# Patient Record
Sex: Male | Born: 1955 | Race: Black or African American | Hispanic: No | Smoking: Current some day smoker
Health system: Southern US, Community
[De-identification: ages and names within clinical notes are randomized; demographics above are authoritative.]

## PROBLEM LIST (undated history)

## (undated) DIAGNOSIS — C220 Liver cell carcinoma: Secondary | ICD-10-CM

## (undated) DIAGNOSIS — E119 Type 2 diabetes mellitus without complications: Secondary | ICD-10-CM

## (undated) DIAGNOSIS — B1920 Unspecified viral hepatitis C without hepatic coma: Secondary | ICD-10-CM

## (undated) DIAGNOSIS — B019 Varicella without complication: Secondary | ICD-10-CM

## (undated) DIAGNOSIS — I10 Essential (primary) hypertension: Secondary | ICD-10-CM

## (undated) HISTORY — DX: Unspecified viral hepatitis C without hepatic coma: B19.20

## (undated) HISTORY — PX: WISDOM TOOTH EXTRACTION: SHX21

## (undated) HISTORY — PX: FINGER SURGERY: SHX640

## (undated) HISTORY — DX: Varicella without complication: B01.9

---

## 2012-07-05 ENCOUNTER — Emergency Department (INDEPENDENT_AMBULATORY_CARE_PROVIDER_SITE_OTHER)
Admission: EM | Admit: 2012-07-05 | Discharge: 2012-07-05 | Disposition: A | Discharge status: Home / Self Care | Payer: 59 | Source: Home / Self Care

## 2012-07-05 ENCOUNTER — Encounter (HOSPITAL_COMMUNITY): Payer: Self-pay | Admitting: Emergency Medicine

## 2012-07-05 DIAGNOSIS — R05 Cough: Secondary | ICD-10-CM

## 2012-07-05 DIAGNOSIS — E119 Type 2 diabetes mellitus without complications: Secondary | ICD-10-CM

## 2012-07-05 DIAGNOSIS — I10 Essential (primary) hypertension: Secondary | ICD-10-CM

## 2012-07-05 DIAGNOSIS — L723 Sebaceous cyst: Secondary | ICD-10-CM

## 2012-07-05 HISTORY — DX: Essential (primary) hypertension: I10

## 2012-07-05 HISTORY — DX: Type 2 diabetes mellitus without complications: E11.9

## 2012-07-05 MED ORDER — METFORMIN HCL 1000 MG PO TABS: 1000.0000 mg | ORAL_TABLET | Freq: Two times a day (BID) | ORAL | Status: DC

## 2012-07-05 MED ORDER — LISINOPRIL 10 MG PO TABS: 10.0000 mg | ORAL_TABLET | Freq: Every day | ORAL | Status: DC

## 2012-07-05 MED ORDER — GLIMEPIRIDE 4 MG PO TABS: 4.0000 mg | ORAL_TABLET | Freq: Every day | ORAL | Status: DC

## 2012-07-05 MED ORDER — SITAGLIPTIN PHOSPHATE 100 MG PO TABS: 100.0000 mg | ORAL_TABLET | Freq: Every day | ORAL | Status: DC

## 2012-07-05 MED ORDER — FEXOFENADINE HCL 180 MG PO TABS: 180.0000 mg | ORAL_TABLET | Freq: Every day | ORAL | Status: DC

## 2012-07-05 NOTE — ED Provider Notes (Signed)
Medical screening examination/treatment/procedure(s) were performed by non-physician practitioner and as supervising physician I was immediately available for consultation/collaboration.   MORENO-COLL,Orian Amberg; MD  Yuepheng Schaller Moreno-Coll, MD 07/05/12 1547 

## 2012-07-05 NOTE — ED Notes (Signed)
Pt is here needing refills on medications... No PCP Also c/o cough onset 2 weeks and abscess on back He is alert and oriented w/no signs of acute distress.

## 2012-07-05 NOTE — ED Provider Notes (Signed)
History     CSN: [id number]  Arrival date & time 07/05/12  1151   None     Chief Complaint  Patient presents with  . Medication Refill  . Cough  . Abscess    (Consider location/radiation/quality/duration/timing/severity/associated sxs/prior treatment) HPI Comments: Pt presents requesting medication refills bc he is currently between PCPs and is about to run out of his diabetic medications.  He has only completely run out of the glimepiride but he has a few left of his metformin, januvia, and lisinopril.  He is also c/o a mild dry cough and a swollen lump on his back.  The cough has been present for a few weeks.  He thinks this is probably from allergies.  He has not tried any OTC cough medications.  Denies any SOB, fever, chills, pleuritic pain.  The lump on his back has been there for at least 2 years and has not changed at all in that time.  Denies any pain, drainage, redness associated with this.  He has never had it addressed before.  He has been on all his antidiabetic medications and lisinopril for years.    Patient is a 57 y.o. male presenting with cough and abscess.  Cough Associated symptoms: no chest pain, no chills, no fever, no myalgias, no rash, no shortness of breath and no sore throat   Abscess Associated symptoms: no fatigue, no fever, no nausea and no vomiting     Past Medical History  Diagnosis Date  . Hypertension   . Diabetes mellitus without complication     History reviewed. No pertinent past surgical history.  No family history on file.  History  Substance Use Topics  . Smoking status: Current Every Day Smoker -- 0.50 packs/day    Types: Cigarettes  . Smokeless tobacco: Not on file  . Alcohol Use: Yes      Review of Systems  Constitutional: Negative for fever, chills and fatigue.  HENT: Negative for sore throat, neck pain and neck stiffness.   Eyes: Negative for visual disturbance.  Respiratory: Positive for cough. Negative for shortness of  breath.   Cardiovascular: Negative for chest pain, palpitations and leg swelling.  Gastrointestinal: Negative for nausea, vomiting, abdominal pain, diarrhea and constipation.  Endocrine: Negative for cold intolerance, heat intolerance, polydipsia and polyuria.  Genitourinary: Negative for dysuria, urgency, frequency and hematuria.  Musculoskeletal: Negative for myalgias and arthralgias.  Skin: Negative for rash.       See HPI regarding back lesion   Neurological: Negative for dizziness, weakness and light-headedness.    Allergies  Review of patient's allergies indicates no known allergies.  Home Medications   Current Outpatient Rx  Name  Route  Sig  Dispense  Refill  . fexofenadine (ALLEGRA) 180 MG tablet   Oral   Take 1 tablet (180 mg total) by mouth daily.   30 tablet   1   . glimepiride (AMARYL) 4 MG tablet   Oral   Take 1 tablet (4 mg total) by mouth daily before breakfast.   60 tablet   0   . lisinopril (PRINIVIL,ZESTRIL) 10 MG tablet   Oral   Take 1 tablet (10 mg total) by mouth daily.   30 tablet   0   . metFORMIN (GLUCOPHAGE) 1000 MG tablet   Oral   Take 1 tablet (1,000 mg total) by mouth 2 (two) times daily with a meal.   60 tablet   0   . sitaGLIPtin (JANUVIA) 100 MG tablet   Oral  Take 1 tablet (100 mg total) by mouth daily.   30 tablet   0     BP 124/80  Pulse 91  Temp(Src) 97.6 F (36.4 C) (Oral)  Resp 18  SpO2 98%  Physical Exam  Constitutional: He is oriented to person, place, and time. He appears well-developed and well-nourished. No distress.  HENT:  Head: Normocephalic and atraumatic.  Eyes: EOM are normal. Pupils are equal, round, and reactive to light.  Cardiovascular: Normal rate and regular rhythm.  Exam reveals no gallop and no friction rub.   No murmur heard. Pulmonary/Chest: Effort normal and breath sounds normal. No respiratory distress. He has no wheezes. He has no rales.  Abdominal: Soft. There is no tenderness.    Neurological: He is oriented to person, place, and time.  Skin: Skin is warm and dry. No rash noted.     Psychiatric: He has a normal mood and affect. Judgment normal.    ED Course  Procedures (including critical care time)  Labs Reviewed - No data to display No results found.   1. Diabetes mellitus, type 2   2. Hypertension   3. Sebaceous cyst   4. Cough       MDM  Will refill his medications for a month, pt also given info for establishing PCP in the area.  Offered referral for general surgery or derm to remove sebaceous cyst, pt declines at this time.  Will use allegra for allergies/cough, pt will return in a week if this does not resolve the issue or if he gets worse.     Meds ordered this encounter  Medications  . DISCONTD: glimepiride (AMARYL) 4 MG tablet    Sig: Take 4 mg by mouth daily before breakfast.  . DISCONTD: metFORMIN (GLUCOPHAGE) 1000 MG tablet    Sig: Take 1,000 mg by mouth 2 (two) times daily with a meal.  . DISCONTD: lisinopril (PRINIVIL,ZESTRIL) 10 MG tablet    Sig: Take 10 mg by mouth daily.  Marland Kitchen DISCONTD: sitaGLIPtin (JANUVIA) 100 MG tablet    Sig: Take 100 mg by mouth daily.  Marland Kitchen glimepiride (AMARYL) 4 MG tablet    Sig: Take 1 tablet (4 mg total) by mouth daily before breakfast.    Dispense:  60 tablet    Refill:  0  . lisinopril (PRINIVIL,ZESTRIL) 10 MG tablet    Sig: Take 1 tablet (10 mg total) by mouth daily.    Dispense:  30 tablet    Refill:  0  . metFORMIN (GLUCOPHAGE) 1000 MG tablet    Sig: Take 1 tablet (1,000 mg total) by mouth 2 (two) times daily with a meal.    Dispense:  60 tablet    Refill:  0  . sitaGLIPtin (JANUVIA) 100 MG tablet    Sig: Take 1 tablet (100 mg total) by mouth daily.    Dispense:  30 tablet    Refill:  0  . fexofenadine (ALLEGRA) 180 MG tablet    Sig: Take 1 tablet (180 mg total) by mouth daily.    Dispense:  30 tablet    Refill:  1           Graylon Good, PA-C 07/05/12 1429

## 2013-02-27 LAB — HM DIABETES EYE EXAM

## 2013-06-08 ENCOUNTER — Ambulatory Visit: Payer: 59 | Admitting: Physician Assistant

## 2013-07-18 ENCOUNTER — Encounter: Payer: Self-pay | Admitting: Physician Assistant

## 2013-07-18 ENCOUNTER — Ambulatory Visit (INDEPENDENT_AMBULATORY_CARE_PROVIDER_SITE_OTHER): Payer: 59 | Admitting: Physician Assistant

## 2013-07-18 VITALS — BP 140/86 | HR 92 | Temp 98.4°F | Resp 16 | Ht 69.25 in | Wt 261.5 lb

## 2013-07-18 DIAGNOSIS — J3089 Other allergic rhinitis: Secondary | ICD-10-CM

## 2013-07-18 DIAGNOSIS — I10 Essential (primary) hypertension: Secondary | ICD-10-CM

## 2013-07-18 DIAGNOSIS — Z23 Encounter for immunization: Secondary | ICD-10-CM

## 2013-07-18 DIAGNOSIS — Z0000 Encounter for general adult medical examination without abnormal findings: Secondary | ICD-10-CM

## 2013-07-18 DIAGNOSIS — N529 Male erectile dysfunction, unspecified: Secondary | ICD-10-CM

## 2013-07-18 DIAGNOSIS — E119 Type 2 diabetes mellitus without complications: Secondary | ICD-10-CM

## 2013-07-18 LAB — CBC
HCT: 43.2 % (ref 39.0–52.0)
Hemoglobin: 14.6 g/dL (ref 13.0–17.0)
MCH: 28.6 pg (ref 26.0–34.0)
MCHC: 33.8 g/dL (ref 30.0–36.0)
MCV: 84.5 fL (ref 78.0–100.0)
PLATELETS: 194 10*3/uL (ref 150–400)
RBC: 5.11 MIL/uL (ref 4.22–5.81)
RDW: 14.3 % (ref 11.5–15.5)
WBC: 5.2 10*3/uL (ref 4.0–10.5)

## 2013-07-18 LAB — LIPID PANEL
CHOLESTEROL: 160 mg/dL (ref 0–200)
HDL: 29 mg/dL — AB (ref >39)
LDL Cholesterol: 102 mg/dL — ABNORMAL HIGH (ref 0–99)
TRIGLYCERIDES: 147 mg/dL (ref <150)
Total CHOL/HDL Ratio: 5.5 Ratio
VLDL: 29 mg/dL (ref 0–40)

## 2013-07-18 LAB — COMPREHENSIVE METABOLIC PANEL
ALK PHOS: 70 U/L (ref 39–117)
ALT: 121 U/L — AB (ref 0–53)
AST: 64 U/L — AB (ref 0–37)
Albumin: 4.4 g/dL (ref 3.5–5.2)
BILIRUBIN TOTAL: 0.5 mg/dL (ref 0.2–1.2)
BUN: 8 mg/dL (ref 6–23)
CO2: 29 mEq/L (ref 19–32)
Calcium: 9.9 mg/dL (ref 8.4–10.5)
Chloride: 101 mEq/L (ref 96–112)
Creat: 0.85 mg/dL (ref 0.50–1.35)
GLUCOSE: 223 mg/dL — AB (ref 70–99)
POTASSIUM: 4.1 meq/L (ref 3.5–5.3)
SODIUM: 139 meq/L (ref 135–145)
TOTAL PROTEIN: 7 g/dL (ref 6.0–8.3)

## 2013-07-18 LAB — HEMOGLOBIN A1C
HEMOGLOBIN A1C: 10 % — AB (ref <5.7)
Mean Plasma Glucose: 240 mg/dL — ABNORMAL HIGH (ref <117)

## 2013-07-18 LAB — TSH: TSH: 0.765 u[IU]/mL (ref 0.350–4.500)

## 2013-07-18 MED ORDER — GLIMEPIRIDE 4 MG PO TABS: 4.0000 mg | ORAL_TABLET | Freq: Every day | ORAL | Status: DC

## 2013-07-18 MED ORDER — SITAGLIPTIN PHOSPHATE 100 MG PO TABS: 100.0000 mg | ORAL_TABLET | Freq: Every day | ORAL | Status: DC

## 2013-07-18 MED ORDER — SILDENAFIL CITRATE 100 MG PO TABS: 100.0000 mg | ORAL_TABLET | ORAL | Status: DC | PRN

## 2013-07-18 MED ORDER — FEXOFENADINE HCL 180 MG PO TABS: 180.0000 mg | ORAL_TABLET | Freq: Every day | ORAL | Status: DC | PRN

## 2013-07-18 MED ORDER — LISINOPRIL 20 MG PO TABS: 20.0000 mg | ORAL_TABLET | Freq: Every day | ORAL | Status: DC

## 2013-07-18 MED ORDER — METFORMIN HCL 1000 MG PO TABS: 1000.0000 mg | ORAL_TABLET | Freq: Two times a day (BID) | ORAL | Status: DC

## 2013-07-18 NOTE — Progress Notes (Signed)
Patient presents to clinic today to establish care.  Acute Concerns: No acute concerns at today's visit.  Chronic Issues: Hypertension -- Currently on Lisinopril 20 mg daily.  Endorses good BP at home.  Denies headaches, vision changes, lightheadedness, chest pain or palpitations.  Diabetes Mellitus II -- Currently on Amaryl, Metformin and Januvia.  Has been out of medication for several weeks.  Allergic Rhinitis -- Controlled with Allegra.  Health Maintenance: Dental -- up-to-date Vision -- up-to-date Immunizations -- Tetanus overdue. Pneumonia recommended due to smoking history Colonoscopy -- Overdue.  Needs referral to GI.  Past Medical History  Diagnosis Date  . Hypertension   . Diabetes mellitus without complication   . Chicken pox     Past Surgical History  Procedure Laterality Date  . Finger surgery      Right-Tendon Cut  . Wisdom tooth extraction      No current outpatient prescriptions on file prior to visit.   No current facility-administered medications on file prior to visit.    No Known Allergies  Family History  Problem Relation Age of Onset  . Heart attack Father     Deceased  . Heart disease Father   . Diabetes Mother     Living  . Heart attack Brother 92    Deceased  . Sudden death Brother   . Thyroid disease Sister   . Diabetes Sister   . Healthy Sister     x1  . Colon cancer Brother     x1  . Heart disease Maternal Grandfather   . Allergies Daughter   . Diabetes Son   . Diabetes Maternal Grandmother     History   Social History  . Marital Status: Divorced    Spouse Name: N/A    Number of Children: N/A  . Years of Education: N/A   Occupational History  . Not on file.   Social History Main Topics  . Smoking status: Current Some Day Smoker -- 0.25 packs/day    Types: Cigarettes  . Smokeless tobacco: Never Used  . Alcohol Use: Yes  . Drug Use: No  . Sexual Activity: Not on file   Other Topics Concern  . Not on file    Social History Narrative  . No narrative on file   Review of Systems  Constitutional: Negative for fever and weight loss.  HENT: Negative for ear discharge, ear pain, hearing loss, nosebleeds and tinnitus.   Eyes: Negative for blurred vision, double vision, photophobia, pain and discharge.  Respiratory: Negative for cough and shortness of breath.   Cardiovascular: Negative for chest pain and palpitations.  Gastrointestinal: Negative for heartburn, nausea, vomiting, abdominal pain, diarrhea, constipation, blood in stool and melena.  Genitourinary: Negative for dysuria, urgency, frequency, hematuria and flank pain.       + Urinary hesitancy, Nocturia x 3-4.  Neurological: Negative for dizziness, loss of consciousness and headaches.  Psychiatric/Behavioral: Negative for depression, suicidal ideas, hallucinations and substance abuse. The patient is not nervous/anxious and does not have insomnia.    BP 140/86  Pulse 92  Temp(Src) 98.4 F (36.9 C) (Oral)  Resp 16  Ht 5' 9.25" (1.759 m)  Wt 261 lb 8 oz (118.616 kg)  BMI 38.34 kg/m2  SpO2 97%  Physical Exam  Vitals reviewed. Constitutional: He is well-developed, well-nourished, and in no distress.  HENT:  Head: Normocephalic and atraumatic.  Right Ear: External ear normal.  Left Ear: External ear normal.  Nose: Nose normal.  Mouth/Throat: Oropharynx is clear and moist.  Cardiovascular: Normal rate, regular rhythm, normal heart sounds and intact distal pulses.   Pulmonary/Chest: Effort normal and breath sounds normal. No respiratory distress. He has no wheezes. He has no rales. He exhibits no tenderness.  Abdominal: Soft. Bowel sounds are normal. He exhibits no distension and no mass. There is no tenderness. There is no rebound and no guarding.  Skin: Skin is warm and dry. No rash noted.  Psychiatric: Affect normal.   Assessment/Plan: Essential hypertension Continue current regimen.  DASH diet encouraged.  Type II or  unspecified type diabetes mellitus without mention of complication, not stated as uncontrolled Continue current regimen.  Will obtain labs to include BMP and A1C.  Continue ACEI.  Diabetic foot exam performed without abnormal findings.  Erectile dysfunction VOucher and RX given for Viagra.  Instructions reviewed with patient.  Visit for preventive health examination History reviewed.  Will obtain fasting labs.  Tetanus and Pneumonia vaccination given.  Referral placed to GI for screening colonoscopy.

## 2013-07-18 NOTE — Patient Instructions (Signed)
Please continue medications as directed.  Obtain labs.  I will call you with your results.  For erectile dysfunction, take 1/2 tablet of the viagra 1 hour prior to sexual intercourse.  Do not take more than 1 dose in 24 hour period. You will be contacted by Gastroenterology for a screening colonoscopy.  Preventive Care for Adults A healthy lifestyle and preventive care can promote health and wellness. Preventive health guidelines for men include the following key practices:  A routine yearly physical is a good way to check with your health care provider about your health and preventative screening. It is a chance to share any concerns and updates on your health and to receive a thorough exam.  Visit your dentist for a routine exam and preventative care every 6 months. Brush your teeth twice a day and floss once a day. Good oral hygiene prevents tooth decay and gum disease.  The frequency of eye exams is based on your age, health, family medical history, use of contact lenses, and other factors. Follow your health care provider's recommendations for frequency of eye exams.  Eat a healthy diet. Foods such as vegetables, fruits, whole grains, low-fat dairy products, and lean protein foods contain the nutrients you need without too many calories. Decrease your intake of foods high in solid fats, added sugars, and salt. Eat the right amount of calories for you.Get information about a proper diet from your health care provider, if necessary.  Regular physical exercise is one of the most important things you can do for your health. Most adults should get at least 150 minutes of moderate-intensity exercise (any activity that increases your heart rate and causes you to sweat) each week. In addition, most adults need muscle-strengthening exercises on 2 or more days a week.  Maintain a healthy weight. The body mass index (BMI) is a screening tool to identify possible weight problems. It provides an estimate of  body fat based on height and weight. Your health care provider can find your BMI and can help you achieve or maintain a healthy weight.For adults 20 years and older:  A BMI below 18.5 is considered underweight.  A BMI of 18.5 to 24.9 is normal.  A BMI of 25 to 29.9 is considered overweight.  A BMI of 30 and above is considered obese.  Maintain normal blood lipids and cholesterol levels by exercising and minimizing your intake of saturated fat. Eat a balanced diet with plenty of fruit and vegetables. Blood tests for lipids and cholesterol should begin at age 33 and be repeated every 5 years. If your lipid or cholesterol levels are high, you are over 50, or you are at high risk for heart disease, you may need your cholesterol levels checked more frequently.Ongoing high lipid and cholesterol levels should be treated with medicines if diet and exercise are not working.  If you smoke, find out from your health care provider how to quit. If you do not use tobacco, do not start.  Lung cancer screening is recommended for adults aged 58-80 years who are at high risk for developing lung cancer because of a history of smoking. A yearly low-dose CT scan of the lungs is recommended for people who have at least a 30-pack-year history of smoking and are a current smoker or have quit within the past 15 years. A pack year of smoking is smoking an average of 1 pack of cigarettes a day for 1 year (for example: 1 pack a day for 30 years or  2 packs a day for 15 years). Yearly screening should continue until the smoker has stopped smoking for at least 15 years. Yearly screening should be stopped for people who develop a health problem that would prevent them from having lung cancer treatment.  If you choose to drink alcohol, do not have more than 2 drinks per day. One drink is considered to be 12 ounces (355 mL) of beer, 5 ounces (148 mL) of wine, or 1.5 ounces (44 mL) of liquor.  Avoid use of street drugs. Do not  share needles with anyone. Ask for help if you need support or instructions about stopping the use of drugs.  High blood pressure causes heart disease and increases the risk of stroke. Your blood pressure should be checked at least every 1-2 years. Ongoing high blood pressure should be treated with medicines, if weight loss and exercise are not effective.  If you are 38-71 years old, ask your health care provider if you should take aspirin to prevent heart disease.  Diabetes screening involves taking a blood sample to check your fasting blood sugar level. This should be done once every 3 years, after age 69, if you are within normal weight and without risk factors for diabetes. Testing should be considered at a younger age or be carried out more frequently if you are overweight and have at least 1 risk factor for diabetes.  Colorectal cancer can be detected and often prevented. Most routine colorectal cancer screening begins at the age of 71 and continues through age 91. However, your health care provider may recommend screening at an earlier age if you have risk factors for colon cancer. On a yearly basis, your health care provider may provide home test kits to check for hidden blood in the stool. Use of a small camera at the end of a tube to directly examine the colon (sigmoidoscopy or colonoscopy) can detect the earliest forms of colorectal cancer. Talk to your health care provider about this at age 43, when routine screening begins. Direct exam of the colon should be repeated every 5-10 years through age 19, unless early forms of precancerous polyps or small growths are found.  People who are at an increased risk for hepatitis B should be screened for this virus. You are considered at high risk for hepatitis B if:  You were born in a country where hepatitis B occurs often. Talk with your health care provider about which countries are considered high risk.  Your parents were born in a high-risk  country and you have not received a shot to protect against hepatitis B (hepatitis B vaccine).  You have HIV or AIDS.  You use needles to inject street drugs.  You live with, or have sex with, someone who has hepatitis B.  You are a man who has sex with other men (MSM).  You get hemodialysis treatment.  You take certain medicines for conditions such as cancer, organ transplantation, and autoimmune conditions.  Hepatitis C blood testing is recommended for all people born from [birth year] through 1965 and any individual with known risks for hepatitis C.  Practice safe sex. Use condoms and avoid high-risk sexual practices to reduce the spread of sexually transmitted infections (STIs). STIs include gonorrhea, chlamydia, syphilis, trichomonas, herpes, HPV, and human immunodeficiency virus (HIV). Herpes, HIV, and HPV are viral illnesses that have no cure. They can result in disability, cancer, and death.  If you are at risk of being infected with HIV, it is recommended that you  take a prescription medicine daily to prevent HIV infection. This is called preexposure prophylaxis (PrEP). You are considered at risk if:  You are a man who has sex with other men (MSM) and have other risk factors.  You are a heterosexual man, are sexually active, and are at increased risk for HIV infection.  You take drugs by injection.  You are sexually active with a partner who has HIV.  Talk with your health care provider about whether you are at high risk of being infected with HIV. If you choose to begin PrEP, you should first be tested for HIV. You should then be tested every 3 months for as long as you are taking PrEP.  A one-time screening for abdominal aortic aneurysm (AAA) and surgical repair of large AAAs by ultrasound are recommended for men ages 86 to 47 years who are current or former smokers.  Healthy men should no longer receive prostate-specific antigen (PSA) blood tests as part of routine cancer  screening. Talk with your health care provider about prostate cancer screening.  Testicular cancer screening is not recommended for adult males who have no symptoms. Screening includes self-exam, a health care provider exam, and other screening tests. Consult with your health care provider about any symptoms you have or any concerns you have about testicular cancer.  Use sunscreen. Apply sunscreen liberally and repeatedly throughout the day. You should seek shade when your shadow is shorter than you. Protect yourself by wearing long sleeves, pants, a wide-brimmed hat, and sunglasses year round, whenever you are outdoors.  Once a month, do a whole-body skin exam, using a mirror to look at the skin on your back. Tell your health care provider about new moles, moles that have irregular borders, moles that are larger than a pencil eraser, or moles that have changed in shape or color.  Stay current with required vaccines (immunizations).  Influenza vaccine. All adults should be immunized every year.  Tetanus, diphtheria, and acellular pertussis (Td, Tdap) vaccine. An adult who has not previously received Tdap or who does not know his vaccine status should receive 1 dose of Tdap. This initial dose should be followed by tetanus and diphtheria toxoids (Td) booster doses every 10 years. Adults with an unknown or incomplete history of completing a 3-dose immunization series with Td-containing vaccines should begin or complete a primary immunization series including a Tdap dose. Adults should receive a Td booster every 10 years.  Varicella vaccine. An adult without evidence of immunity to varicella should receive 2 doses or a second dose if he has previously received 1 dose.  Human papillomavirus (HPV) vaccine. Males aged 50-21 years who have not received the vaccine previously should receive the 3-dose series. Males aged 22-26 years may be immunized. Immunization is recommended through the age of 91 years for  any male who has sex with males and did not get any or all doses earlier. Immunization is recommended for any person with an immunocompromised condition through the age of 35 years if he did not get any or all doses earlier. During the 3-dose series, the second dose should be obtained 4-8 weeks after the first dose. The third dose should be obtained 24 weeks after the first dose and 16 weeks after the second dose.  Zoster vaccine. One dose is recommended for adults aged 55 years or older unless certain conditions are present.  Measles, mumps, and rubella (MMR) vaccine. Adults born before [birth year] generally are considered immune to measles and mumps. Adults born  in 1957 or later should have 1 or more doses of MMR vaccine unless there is a contraindication to the vaccine or there is laboratory evidence of immunity to each of the three diseases. A routine second dose of MMR vaccine should be obtained at least 28 days after the first dose for students attending postsecondary schools, health care workers, or international travelers. People who received inactivated measles vaccine or an unknown type of measles vaccine during 1963-1967 should receive 2 doses of MMR vaccine. People who received inactivated mumps vaccine or an unknown type of mumps vaccine before 1979 and are at high risk for mumps infection should consider immunization with 2 doses of MMR vaccine. Unvaccinated health care workers born before [birth year] who lack laboratory evidence of measles, mumps, or rubella immunity or laboratory confirmation of disease should consider measles and mumps immunization with 2 doses of MMR vaccine or rubella immunization with 1 dose of MMR vaccine.  Pneumococcal 13-valent conjugate (PCV13) vaccine. When indicated, a person who is uncertain of his immunization history and has no record of immunization should receive the PCV13 vaccine. An adult aged 36 years or older who has certain medical conditions and has not been previously  immunized should receive 1 dose of PCV13 vaccine. This PCV13 should be followed with a dose of pneumococcal polysaccharide (PPSV23) vaccine. The PPSV23 vaccine dose should be obtained at least 8 weeks after the dose of PCV13 vaccine. An adult aged 66 years or older who has certain medical conditions and previously received 1 or more doses of PPSV23 vaccine should receive 1 dose of PCV13. The PCV13 vaccine dose should be obtained 1 or more years after the last PPSV23 vaccine dose.  Pneumococcal polysaccharide (PPSV23) vaccine. When PCV13 is also indicated, PCV13 should be obtained first. All adults aged 71 years and older should be immunized. An adult younger than age 34 years who has certain medical conditions should be immunized. Any person who resides in a nursing home or long-term care facility should be immunized. An adult smoker should be immunized. People with an immunocompromised condition and certain other conditions should receive both PCV13 and PPSV23 vaccines. People with human immunodeficiency virus (HIV) infection should be immunized as soon as possible after diagnosis. Immunization during chemotherapy or radiation therapy should be avoided. Routine use of PPSV23 vaccine is not recommended for American Indians, [place] Natives, or people younger than 65 years unless there are medical conditions that require PPSV23 vaccine. When indicated, people who have unknown immunization and have no record of immunization should receive PPSV23 vaccine. One-time revaccination 5 years after the first dose of PPSV23 is recommended for people aged 19-64 years who have chronic kidney failure, nephrotic syndrome, asplenia, or immunocompromised conditions. People who received 1-2 doses of PPSV23 before age 37 years should receive another dose of PPSV23 vaccine at age 15 years or later if at least 5 years have passed since the previous dose. Doses of PPSV23 are not needed for people immunized with PPSV23 at or after age 73  years.  Meningococcal vaccine. Adults with asplenia or persistent complement component deficiencies should receive 2 doses of quadrivalent meningococcal conjugate (MenACWY-D) vaccine. The doses should be obtained at least 2 months apart. Microbiologists working with certain meningococcal bacteria, [place] recruits, people at risk during an outbreak, and people who travel to or live in countries with a high rate of meningitis should be immunized. A first-year college student up through age 84 years who is living in a residence hall should receive a dose  if he did not receive a dose on or after his 16th birthday. Adults who have certain high-risk conditions should receive one or more doses of vaccine.  Hepatitis A vaccine. Adults who wish to be protected from this disease, have certain high-risk conditions, work with hepatitis A-infected animals, work in hepatitis A research labs, or travel to or work in countries with a high rate of hepatitis A should be immunized. Adults who were previously unvaccinated and who anticipate close contact with an international adoptee during the first 60 days after arrival in the [place] from a country with a high rate of hepatitis A should be immunized.  Hepatitis B vaccine. Adults should be immunized if they wish to be protected from this disease, have certain high-risk conditions, may be exposed to blood or other infectious body fluids, are household contacts or sex partners of hepatitis B positive people, are clients or workers in certain care facilities, or travel to or work in countries with a high rate of hepatitis B.  Haemophilus influenzae type b (Hib) vaccine. A previously unvaccinated person with asplenia or sickle cell disease or having a scheduled splenectomy should receive 1 dose of Hib vaccine. Regardless of previous immunization, a recipient of a hematopoietic stem cell transplant should receive a 3-dose series 6-12 months after his successful transplant.  Hib vaccine is not recommended for adults with HIV infection. Preventive Service / Frequency Ages 74 to 49  Blood pressure check.** / Every 1 to 2 years.  Lipid and cholesterol check.** / Every 5 years beginning at age 38.  Hepatitis C blood test.** / For any individual with known risks for hepatitis C.  Skin self-exam. / Monthly.  Influenza vaccine. / Every year.  Tetanus, diphtheria, and acellular pertussis (Tdap, Td) vaccine.** / Consult your health care provider. 1 dose of Td every 10 years.  Varicella vaccine.** / Consult your health care provider.  HPV vaccine. / 3 doses over 6 months, if 49 or younger.  Measles, mumps, rubella (MMR) vaccine.** / You need at least 1 dose of MMR if you were born in 1957 or later. You may also need a second dose.  Pneumococcal 13-valent conjugate (PCV13) vaccine.** / Consult your health care provider.  Pneumococcal polysaccharide (PPSV23) vaccine.** / 1 to 2 doses if you smoke cigarettes or if you have certain conditions.  Meningococcal vaccine.** / 1 dose if you are age 62 to 42 years and a Market researcher living in a residence hall, or have one of several medical conditions. You may also need additional booster doses.  Hepatitis A vaccine.** / Consult your health care provider.  Hepatitis B vaccine.** / Consult your health care provider.  Haemophilus influenzae type b (Hib) vaccine.** / Consult your health care provider. Ages 10 to 18  Blood pressure check.** / Every 1 to 2 years.  Lipid and cholesterol check.** / Every 5 years beginning at age 49.  Lung cancer screening. / Every year if you are aged 26-80 years and have a 30-pack-year history of smoking and currently smoke or have quit within the past 15 years. Yearly screening is stopped once you have quit smoking for at least 15 years or develop a health problem that would prevent you from having lung cancer treatment.  Fecal occult blood test (FOBT) of stool. / Every year  beginning at age 29 and continuing until age 62. You may not have to do this test if you get a colonoscopy every 10 years.  Flexible sigmoidoscopy** or colonoscopy.** /  Every 5 years for a flexible sigmoidoscopy or every 10 years for a colonoscopy beginning at age 15 and continuing until age 48.  Hepatitis C blood test.** / For all people born from [birth year] through 1965 and any individual with known risks for hepatitis C.  Skin self-exam. / Monthly.  Influenza vaccine. / Every year.  Tetanus, diphtheria, and acellular pertussis (Tdap/Td) vaccine.** / Consult your health care provider. 1 dose of Td every 10 years.  Varicella vaccine.** / Consult your health care provider.  Zoster vaccine.** / 1 dose for adults aged 10 years or older.  Measles, mumps, rubella (MMR) vaccine.** / You need at least 1 dose of MMR if you were born in 1957 or later. You may also need a second dose.  Pneumococcal 13-valent conjugate (PCV13) vaccine.** / Consult your health care provider.  Pneumococcal polysaccharide (PPSV23) vaccine.** / 1 to 2 doses if you smoke cigarettes or if you have certain conditions.  Meningococcal vaccine.** / Consult your health care provider.  Hepatitis A vaccine.** / Consult your health care provider.  Hepatitis B vaccine.** / Consult your health care provider.  Haemophilus influenzae type b (Hib) vaccine.** / Consult your health care provider. Ages 24 and over  Blood pressure check.** / Every 1 to 2 years.  Lipid and cholesterol check.**/ Every 5 years beginning at age 64.  Lung cancer screening. / Every year if you are aged 55-80 years and have a 30-pack-year history of smoking and currently smoke or have quit within the past 15 years. Yearly screening is stopped once you have quit smoking for at least 15 years or develop a health problem that would prevent you from having lung cancer treatment.  Fecal occult blood test (FOBT) of stool. / Every year beginning at age 15 and  continuing until age 78. You may not have to do this test if you get a colonoscopy every 10 years.  Flexible sigmoidoscopy** or colonoscopy.** / Every 5 years for a flexible sigmoidoscopy or every 10 years for a colonoscopy beginning at age 39 and continuing until age 56.  Hepatitis C blood test.** / For all people born from [birth year] through 1965 and any individual with known risks for hepatitis C.  Abdominal aortic aneurysm (AAA) screening.** / A one-time screening for ages 64 to 61 years who are current or former smokers.  Skin self-exam. / Monthly.  Influenza vaccine. / Every year.  Tetanus, diphtheria, and acellular pertussis (Tdap/Td) vaccine.** / 1 dose of Td every 10 years.  Varicella vaccine.** / Consult your health care provider.  Zoster vaccine.** / 1 dose for adults aged 73 years or older.  Pneumococcal 13-valent conjugate (PCV13) vaccine.** / Consult your health care provider.  Pneumococcal polysaccharide (PPSV23) vaccine.** / 1 dose for all adults aged 89 years and older.  Meningococcal vaccine.** / Consult your health care provider.  Hepatitis A vaccine.** / Consult your health care provider.  Hepatitis B vaccine.** / Consult your health care provider.  Haemophilus influenzae type b (Hib) vaccine.** / Consult your health care provider. **Family history and personal history of risk and conditions may change your health care provider's recommendations. Document Released: 03/11/2001 Document Revised: 01/18/2013 Document Reviewed: 06/10/2010 [facility] Patient Information 2015 [place], [place]. This information is not intended to replace advice given to you by your health care provider. Make sure you discuss any questions you have with your health care provider.

## 2013-07-18 NOTE — Progress Notes (Signed)
Pre visit review using our clinic review tool, if applicable. No additional management support is needed unless otherwise documented below in the visit note/SLS  

## 2013-07-19 ENCOUNTER — Telehealth: Payer: Self-pay | Admitting: Physician Assistant

## 2013-07-19 LAB — URINALYSIS, ROUTINE W REFLEX MICROSCOPIC
BILIRUBIN URINE: NEGATIVE
Glucose, UA: 500 mg/dL — AB
Hgb urine dipstick: NEGATIVE
Leukocytes, UA: NEGATIVE
Nitrite: NEGATIVE
PH: 5 (ref 5.0–8.0)
Protein, ur: NEGATIVE mg/dL
UROBILINOGEN UA: 0.2 mg/dL (ref 0.0–1.0)

## 2013-07-19 LAB — URINALYSIS, MICROSCOPIC ONLY
BACTERIA UA: NONE SEEN
CASTS: NONE SEEN
CRYSTALS: NONE SEEN
Squamous Epithelial / LPF: NONE SEEN

## 2013-07-19 LAB — PSA: PSA: 0.91 ng/mL (ref <=4.00)

## 2013-07-19 LAB — MICROALBUMIN / CREATININE URINE RATIO
CREATININE, URINE: 232.1 mg/dL
MICROALB/CREAT RATIO: 13.2 mg/g (ref 0.0–30.0)
Microalb, Ur: 3.06 mg/dL — ABNORMAL HIGH (ref 0.00–1.89)

## 2013-07-19 NOTE — Telephone Encounter (Signed)
Relevant patient education mailed to patient.  

## 2013-07-22 ENCOUNTER — Telehealth: Payer: Self-pay | Admitting: *Deleted

## 2013-07-22 NOTE — Telephone Encounter (Signed)
Patient notified

## 2013-07-22 NOTE — Telephone Encounter (Signed)
Message copied by Julieta Bellini on Fri Jul 22, 2013 11:09 AM ------      Message from: Raiford Noble      Created: Tue Jul 19, 2013  6:17 AM       Lab results are in:      (1) Blood counts and kidney function look good.  So does his thyroid testing and PSA level.      (2) His liver enzymes are elevated.  I want him to avoid any alcohol or tylenol-containing products.  I want to recheck a CMP, Hepatitis Panel in 3-4 weeks.  Diagnosis -- Elevated ALT      (3) A1C is at 10 which is definitely above goal.  Please assess how long patient has been out of medication before getting refills yesterday.  I want to know before I make decisions regarding medication.            Follow-up 1 month. ------

## 2013-07-22 NOTE — Telephone Encounter (Signed)
Ok I want him to continue current medications taking as directed.  We will recheck his BMP in 1 month and A1C in 3 months.  He needs to make sure not to run out of medication before letting us know he needs a refill.  Follow-up in 1 month.

## 2013-07-22 NOTE — Telephone Encounter (Signed)
Patient stated that it was 2 weeks before appt since he took medication.

## 2013-07-24 DIAGNOSIS — E119 Type 2 diabetes mellitus without complications: Secondary | ICD-10-CM | POA: Insufficient documentation

## 2013-07-24 DIAGNOSIS — N529 Male erectile dysfunction, unspecified: Secondary | ICD-10-CM | POA: Insufficient documentation

## 2013-07-24 DIAGNOSIS — I10 Essential (primary) hypertension: Secondary | ICD-10-CM | POA: Insufficient documentation

## 2013-07-24 DIAGNOSIS — Z0000 Encounter for general adult medical examination without abnormal findings: Secondary | ICD-10-CM | POA: Insufficient documentation

## 2013-07-24 NOTE — Assessment & Plan Note (Signed)
History reviewed.  Will obtain fasting labs.  Tetanus and Pneumonia vaccination given.  Referral placed to GI for screening colonoscopy.

## 2013-07-24 NOTE — Assessment & Plan Note (Signed)
VOucher and RX given for Viagra.  Instructions reviewed with patient.

## 2013-07-24 NOTE — Assessment & Plan Note (Signed)
Continue current regimen.  Will obtain labs to include BMP and A1C.  Continue ACEI.  Diabetic foot exam performed without abnormal findings.

## 2013-07-24 NOTE — Assessment & Plan Note (Signed)
Continue current regimen.  DASH diet encouraged.

## 2013-08-22 ENCOUNTER — Telehealth: Payer: Self-pay | Admitting: Physician Assistant

## 2013-08-22 DIAGNOSIS — E119 Type 2 diabetes mellitus without complications: Secondary | ICD-10-CM

## 2013-08-22 MED ORDER — GLIMEPIRIDE 4 MG PO TABS: 4.0000 mg | ORAL_TABLET | Freq: Every day | ORAL | Status: DC

## 2013-08-22 MED ORDER — GLIMEPIRIDE 4 MG PO TABS
4.0000 mg | ORAL_TABLET | Freq: Every day | ORAL | Status: DC
Start: 2013-08-22 — End: 2013-08-22

## 2013-08-22 NOTE — Telephone Encounter (Signed)
Requesting refill on Glimepiride 4mg , please call into [facility] in [place]Ph [phone] fax [phone]

## 2013-08-22 NOTE — Telephone Encounter (Signed)
Rx request to [facility] per pt request/SLS

## 2013-09-05 ENCOUNTER — Encounter: Payer: Self-pay | Admitting: Physician Assistant

## 2013-09-14 ENCOUNTER — Telehealth: Payer: Self-pay

## 2013-09-14 NOTE — Telephone Encounter (Signed)
Diabetic bundle  To early to recheck A1C done on 07-18-13

## 2013-12-13 ENCOUNTER — Other Ambulatory Visit: Payer: Self-pay | Admitting: *Deleted

## 2013-12-13 DIAGNOSIS — E119 Type 2 diabetes mellitus without complications: Secondary | ICD-10-CM

## 2013-12-13 MED ORDER — GLIMEPIRIDE 4 MG PO TABS: 4.0000 mg | ORAL_TABLET | Freq: Every day | ORAL | Status: DC

## 2013-12-13 NOTE — Telephone Encounter (Signed)
Faxed refill request received from [facility]: Donovan Kail [[place]] [place]] for Amaryl 4 mg Last filled by MD on 07.27.15, #[id number] Last AEX - 06.22.15 Next AEX - 1 Mth. [Patient overdue for follow-up]  Per provider VO, faxed authorization to pharmacy for dispense #30 with 0 refills and note: Patient Must HAve Office Visit Prior To Future Refill Authorizations/SLS

## 2013-12-20 ENCOUNTER — Telehealth: Payer: Self-pay | Admitting: Physician Assistant

## 2013-12-20 ENCOUNTER — Ambulatory Visit (INDEPENDENT_AMBULATORY_CARE_PROVIDER_SITE_OTHER): Payer: 59 | Admitting: Physician Assistant

## 2013-12-20 ENCOUNTER — Ambulatory Visit (INDEPENDENT_AMBULATORY_CARE_PROVIDER_SITE_OTHER): Payer: 59 | Admitting: *Deleted

## 2013-12-20 ENCOUNTER — Encounter: Payer: Self-pay | Admitting: Physician Assistant

## 2013-12-20 VITALS — BP 154/92 | HR 110 | Temp 98.0°F | Resp 97 | Wt 246.0 lb

## 2013-12-20 DIAGNOSIS — Z23 Encounter for immunization: Secondary | ICD-10-CM

## 2013-12-20 DIAGNOSIS — I10 Essential (primary) hypertension: Secondary | ICD-10-CM

## 2013-12-20 DIAGNOSIS — IMO0002 Reserved for concepts with insufficient information to code with codable children: Secondary | ICD-10-CM

## 2013-12-20 DIAGNOSIS — R748 Abnormal levels of other serum enzymes: Secondary | ICD-10-CM

## 2013-12-20 DIAGNOSIS — L97529 Non-pressure chronic ulcer of other part of left foot with unspecified severity: Secondary | ICD-10-CM

## 2013-12-20 DIAGNOSIS — E11621 Type 2 diabetes mellitus with foot ulcer: Secondary | ICD-10-CM | POA: Insufficient documentation

## 2013-12-20 DIAGNOSIS — E1165 Type 2 diabetes mellitus with hyperglycemia: Secondary | ICD-10-CM

## 2013-12-20 LAB — COMPREHENSIVE METABOLIC PANEL
ALT: 232 U/L — ABNORMAL HIGH (ref 0–53)
AST: 190 U/L — ABNORMAL HIGH (ref 0–37)
Albumin: 4.3 g/dL (ref 3.5–5.2)
Alkaline Phosphatase: 74 U/L (ref 39–117)
BILIRUBIN TOTAL: 0.6 mg/dL (ref 0.2–1.2)
BUN: 7 mg/dL (ref 6–23)
CO2: 25 meq/L (ref 19–32)
CREATININE: 1 mg/dL (ref 0.4–1.5)
Calcium: 9.3 mg/dL (ref 8.4–10.5)
Chloride: 103 mEq/L (ref 96–112)
GFR: 94.07 mL/min (ref >60.00)
GLUCOSE: 193 mg/dL — AB (ref 70–99)
Potassium: 4.2 mEq/L (ref 3.5–5.1)
Sodium: 141 mEq/L (ref 135–145)
Total Protein: 7.7 g/dL (ref 6.0–8.3)

## 2013-12-20 LAB — HEMOGLOBIN A1C: HEMOGLOBIN A1C: 10.5 % — AB (ref 4.6–6.5)

## 2013-12-20 LAB — URINALYSIS, ROUTINE W REFLEX MICROSCOPIC
Bilirubin Urine: NEGATIVE
Ketones, ur: NEGATIVE
LEUKOCYTES UA: NEGATIVE
NITRITE: NEGATIVE
SPECIFIC GRAVITY, URINE: 1.025 (ref 1.000–1.030)
UROBILINOGEN UA: 0.2 (ref 0.0–1.0)
Urine Glucose: NEGATIVE
WBC UA: NONE SEEN (ref 0–?)
pH: 5.5 (ref 5.0–8.0)

## 2013-12-20 MED ORDER — METFORMIN HCL 1000 MG PO TABS: 1000.0000 mg | ORAL_TABLET | Freq: Two times a day (BID) | ORAL | Status: DC

## 2013-12-20 MED ORDER — LOSARTAN POTASSIUM 100 MG PO TABS: 100.0000 mg | ORAL_TABLET | Freq: Every day | ORAL | Status: DC

## 2013-12-20 MED ORDER — SITAGLIPTIN PHOSPHATE 100 MG PO TABS: 100.0000 mg | ORAL_TABLET | Freq: Every day | ORAL | Status: DC

## 2013-12-20 MED ORDER — GLIMEPIRIDE 4 MG PO TABS: 4.0000 mg | ORAL_TABLET | Freq: Every day | ORAL | Status: DC

## 2013-12-20 NOTE — Patient Instructions (Signed)
Please resume medications as directed with the following exceptions -- Stop the lisinopril and begin Losartan daily.  This will hopefully resolve your cough.  Follow-up with me in 1 month. Make sure to check your fasting blood sugar twice weekly and write down.  Diabetes and Exercise Exercising regularly is important. It is not just about losing weight. It has many health benefits, such as:  Improving your overall fitness, flexibility, and endurance.  Increasing your bone density.  Helping with weight control.  Decreasing your body fat.  Increasing your muscle strength.  Reducing stress and tension.  Improving your overall health. People with diabetes who exercise gain additional benefits because exercise:  Reduces appetite.  Improves the body's use of blood sugar (glucose).  Helps lower or control blood glucose.  Decreases blood pressure.  Helps control blood lipids (such as cholesterol and triglycerides).  Improves the body's use of the hormone insulin by:  Increasing the body's insulin sensitivity.  Reducing the body's insulin needs.  Decreases the risk for heart disease because exercising:  Lowers cholesterol and triglycerides levels.  Increases the levels of good cholesterol (such as high-density lipoproteins [HDL]) in the body.  Lowers blood glucose levels. YOUR ACTIVITY PLAN  Choose an activity that you enjoy and set realistic goals. Your health care provider or diabetes educator can help you make an activity plan that works for you. Exercise regularly as directed by your health care provider. This includes:  Performing resistance training twice a week such as push-ups, sit-ups, lifting weights, or using resistance bands.  Performing 150 minutes of cardio exercises each week such as walking, running, or playing sports.  Staying active and spending no more than 90 minutes at one time being inactive. Even short bursts of exercise are good for you. Three  10-minute sessions spread throughout the day are just as beneficial as a single 30-minute session. Some exercise ideas include:  Taking the dog for a walk.  Taking the stairs instead of the elevator.  Dancing to your favorite song.  Doing an exercise video.  Doing your favorite exercise with a friend. RECOMMENDATIONS FOR EXERCISING WITH TYPE 1 OR TYPE 2 DIABETES   Check your blood glucose before exercising. If blood glucose levels are greater than 240 mg/dL, check for urine ketones. Do not exercise if ketones are present.  Avoid injecting insulin into areas of the body that are going to be exercised. For example, avoid injecting insulin into:  The arms when playing tennis.  The legs when jogging.  Keep a record of:  Food intake before and after you exercise.  Expected peak times of insulin action.  Blood glucose levels before and after you exercise.  The type and amount of exercise you have done.  Review your records with your health care provider. Your health care provider will help you to develop guidelines for adjusting food intake and insulin amounts before and after exercising.  If you take insulin or oral hypoglycemic agents, watch for signs and symptoms of hypoglycemia. They include:  Dizziness.  Shaking.  Sweating.  Chills.  Confusion.  Drink plenty of water while you exercise to prevent dehydration or heat stroke. Body water is lost during exercise and must be replaced.  Talk to your health care provider before starting an exercise program to make sure it is safe for you. Remember, almost any type of activity is better than none. Document Released: 04/05/2003 Document Revised: 05/30/2013 Document Reviewed: 06/22/2012 [facility] Patient Information 2015 [place], [place]. This information is not intended  to replace advice given to you by your health care provider. Make sure you discuss any questions you have with your health care provider.  

## 2013-12-20 NOTE — Assessment & Plan Note (Signed)
2 mm in diameter. No evidence of drainage or cellulitis. Wound care discussed with patient. Patient has been educated on alarm signs and symptoms, as well as the serious nature of the diabetic foot ulcer, no matter how small. Follow-up as scheduled. Patient will be heading back to [place] for work for a couple weeks. Instructed if the area becomes worse, he is to proceed directly to an urgent care.

## 2013-12-20 NOTE — Assessment & Plan Note (Signed)
Patient noncompliant with medications and instructions. Foot exam performed today with 2 cm linear ulceration noted on dorsum of left great toe. No evidence of cellulitis. Some mild neuropathy noted. Will obtain BMP and A1C today. Patient given another glucometer. Was instructed to check fasting blood sugar a few times a week. Right this number down. Bring to follow-up in one month.

## 2013-12-20 NOTE — Progress Notes (Signed)
Patient presents to clinic today for follow-up of uncontrolled Type II DM.  Last A1C at 10.0 taken in 07/18/13.  Patient well overdue for follow-up.  Patient currently on combination of Januvia 100 mg daily, Metformin 1000 mg BID and Amaryl 4 mg once daily.  Patient has not been taking metformin or Januvia as instructed. He is taking his Amaryl when he "remembers that" has not been checking fasting blood sugar as directed. Patient endorses numbness and tingling of toes bilaterally.  Patient denies blurry vision. Endorses good urinary output.  Patient also complains of dry and persistent cough since restarting his lisinopril 3-1/2 weeks ago. Has not taken blood pressure medication this morning. BP above goal.   Past Medical History  Diagnosis Date  . Hypertension   . Diabetes mellitus without complication   . Chicken pox     Current Outpatient Prescriptions on File Prior to Visit  Medication Sig Dispense Refill  . ibuprofen (ADVIL,MOTRIN) 200 MG tablet Take 200 mg by mouth every 6 (six) hours as needed.    . sildenafil (VIAGRA) 100 MG tablet Take 1 tablet (100 mg total) by mouth as needed for erectile dysfunction. 3 tablet 0  . fexofenadine (ALLEGRA) 180 MG tablet Take 1 tablet (180 mg total) by mouth daily as needed for allergies. (Patient not taking: Reported on 12/20/2013) 30 tablet 1   No current facility-administered medications on file prior to visit.    No Known Allergies  Family History  Problem Relation Age of Onset  . Heart attack Father     Deceased  . Heart disease Father   . Diabetes Mother     Living  . Heart attack Brother 32    Deceased  . Sudden death Brother   . Thyroid disease Sister   . Diabetes Sister   . Healthy Sister     x1  . Colon cancer Brother     x1  . Heart disease Maternal Grandfather   . Allergies Daughter   . Diabetes Son   . Diabetes Maternal Grandmother     History   Social History  . Marital Status: Divorced    Spouse Name: N/A   Number of Children: N/A  . Years of Education: N/A   Social History Main Topics  . Smoking status: Current Some Day Smoker -- 0.25 packs/day    Types: Cigarettes  . Smokeless tobacco: Never Used  . Alcohol Use: Yes  . Drug Use: No  . Sexual Activity: None   Other Topics Concern  . None   Social History Narrative   Review of Systems - See HPI.  All other ROS are negative.  BP 154/92 mmHg  Pulse 110  Temp(Src) 98 F (36.7 C) (Oral)  Resp 97  Wt 246 lb (111.585 kg)  SpO2 97%  Physical Exam  Constitutional: He is oriented to person, place, and time and well-developed, well-nourished, and in no distress.  HENT:  Head: Normocephalic and atraumatic.  Eyes: Conjunctivae are normal. Pupils are equal, round, and reactive to light.  Neck: Neck supple.  Cardiovascular: Normal rate, regular rhythm, normal heart sounds and intact distal pulses.   Pulmonary/Chest: Effort normal and breath sounds normal. No respiratory distress. He has no wheezes. He has no rales. He exhibits no tenderness.  Lymphadenopathy:    He has no cervical adenopathy.  Neurological: He is alert and oriented to person, place, and time.  Skin: Skin is warm and dry. No rash noted.  Psychiatric: Affect normal.  Vitals reviewed.  Assessment/Plan: Essential hypertension Lisinopril discontinued due to cough. We'll begin losartan 100 mg daily. Continue DASH diet. Follow-up in 3-4 weeks for blood pressure recheck.  Diabetes mellitus type II, uncontrolled Patient noncompliant with medications and instructions. Foot exam performed today with 2 cm linear ulceration noted on dorsum of left great toe. No evidence of cellulitis. Some mild neuropathy noted. Will obtain BMP and A1C today. Patient given another glucometer. Was instructed to check fasting blood sugar a few times a week. Right this number down. Bring to follow-up in one month.   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 No evidence of drainage or cellulitis.  Wound care discussed with patient. Patient has been educated on alarm signs and symptoms, as well as the serious nature of the diabetic foot ulcer, no matter how small. Follow-up as scheduled. Patient will be heading back to [place] for work for a couple weeks. Instructed if the area becomes worse, he is to proceed directly to an urgent care.

## 2013-12-20 NOTE — Assessment & Plan Note (Signed)
Lisinopril discontinued due to cough. We'll begin losartan 100 mg daily. Continue DASH diet. Follow-up in 3-4 weeks for blood pressure recheck.

## 2013-12-20 NOTE — Progress Notes (Signed)
Pre visit review using our clinic review tool, if applicable. No additional management support is needed unless otherwise documented below in the visit note. 

## 2013-12-20 NOTE — Telephone Encounter (Signed)
Liver enzymes have increased.  Patient needs to return to lab for hepatitis panel and for an ultrasound of his abdomen.  Absolutely no alcohol consumption or use of tylenol-containing products.  We need to assess if there is damage to his liver.  A1C has risen as expected. Patient needs to be compliant with medications.  Remind him to check fasting glucose a few times per week and bring a log of this to his follow-up.

## 2013-12-20 NOTE — Telephone Encounter (Signed)
Called pt and lmovm to return call.

## 2013-12-21 ENCOUNTER — Telehealth: Payer: Self-pay | Admitting: Physician Assistant

## 2013-12-21 NOTE — Telephone Encounter (Signed)
Patient informed, understood & agreed; states he already has the [place], will call back to schedule lab appt/SLS

## 2013-12-21 NOTE — Telephone Encounter (Signed)
emmi mailed  °

## 2013-12-27 ENCOUNTER — Other Ambulatory Visit (HOSPITAL_BASED_OUTPATIENT_CLINIC_OR_DEPARTMENT_OTHER): Payer: 59

## 2013-12-31 ENCOUNTER — Ambulatory Visit (HOSPITAL_BASED_OUTPATIENT_CLINIC_OR_DEPARTMENT_OTHER): Payer: 59

## 2014-01-10 ENCOUNTER — Telehealth: Payer: Self-pay | Admitting: Physician Assistant

## 2014-01-10 NOTE — Telephone Encounter (Signed)
Error/gd °

## 2014-01-12 ENCOUNTER — Other Ambulatory Visit (INDEPENDENT_AMBULATORY_CARE_PROVIDER_SITE_OTHER): Payer: 59

## 2014-01-12 ENCOUNTER — Ambulatory Visit (HOSPITAL_BASED_OUTPATIENT_CLINIC_OR_DEPARTMENT_OTHER)
Admission: RE | Admit: 2014-01-12 | Discharge: 2014-01-12 | Disposition: A | Discharge status: Home / Self Care | Payer: 59 | Source: Ambulatory Visit | Attending: Physician Assistant | Admitting: Physician Assistant

## 2014-01-12 DIAGNOSIS — R748 Abnormal levels of other serum enzymes: Secondary | ICD-10-CM

## 2014-01-12 LAB — HEPATITIS PANEL, ACUTE
HCV Ab: REACTIVE — AB
HEP A IGM: NONREACTIVE
Hep B C IgM: REACTIVE — AB
Hepatitis B Surface Ag: NEGATIVE

## 2014-01-13 ENCOUNTER — Telehealth: Payer: Self-pay | Admitting: Physician Assistant

## 2014-01-13 ENCOUNTER — Other Ambulatory Visit: Payer: 59

## 2014-01-13 ENCOUNTER — Telehealth: Payer: Self-pay | Admitting: *Deleted

## 2014-01-13 NOTE — Telephone Encounter (Signed)
I will call patient personally with his results.

## 2014-01-13 NOTE — Telephone Encounter (Signed)
Attempted to reach patient to relay results.  Left message on voicemail for call back for results.  His US shows sign on fatty liver which could be contributing to his elevated liver enzymes.  He should limit intake of fatty foods or foods high in cholesterol. Drink more water and exercise more.  However, his hepatitis panel came back positive for Hepatitis B and Hepatitis C.  I am waiting on additional blood work result to confirm these findings.  That being said, he needs to be set up with a GI specialist for further evaluation and treatment.  If chronic hepatitis is untreated, it can cause lifelong health problems and increase his risk of liver cancer.  Please let me know if he is willing to let me set this up for him.

## 2014-01-13 NOTE — Telephone Encounter (Signed)
[facility] called stating that they did not have enough blood to run the hep c test . lmovm for pt to come back for redraw.

## 2014-01-13 NOTE — Telephone Encounter (Signed)
Pt notified of the comments, was not happy and wanted to speak with Einar Pheasant in person. I scheduled pt for Monday at 10. It is only a 15 minute appt but pt was adamant that he needed to see provider asasp. Advised that he would be on his way today if i could not get him in.

## 2014-01-13 NOTE — Telephone Encounter (Signed)
Pt inquiring about lab results. Pt stated he missed a call

## 2014-01-13 NOTE — Telephone Encounter (Signed)
Please advise, you said pt was aware correct?

## 2014-01-16 ENCOUNTER — Telehealth: Payer: Self-pay

## 2014-01-16 ENCOUNTER — Encounter: Payer: Self-pay | Admitting: Physician Assistant

## 2014-01-16 ENCOUNTER — Telehealth: Payer: Self-pay | Admitting: *Deleted

## 2014-01-16 ENCOUNTER — Ambulatory Visit (INDEPENDENT_AMBULATORY_CARE_PROVIDER_SITE_OTHER): Payer: 59 | Admitting: Physician Assistant

## 2014-01-16 ENCOUNTER — Emergency Department (HOSPITAL_BASED_OUTPATIENT_CLINIC_OR_DEPARTMENT_OTHER)
Admission: EM | Admit: 2014-01-16 | Discharge: 2014-01-16 | Disposition: A | Discharge status: Other Acute Inpatient Hospital | Payer: 59 | Attending: Emergency Medicine | Admitting: Emergency Medicine

## 2014-01-16 ENCOUNTER — Encounter (HOSPITAL_BASED_OUTPATIENT_CLINIC_OR_DEPARTMENT_OTHER): Payer: Self-pay | Admitting: *Deleted

## 2014-01-16 ENCOUNTER — Emergency Department (HOSPITAL_BASED_OUTPATIENT_CLINIC_OR_DEPARTMENT_OTHER): Payer: 59

## 2014-01-16 VITALS — BP 141/106 | HR 98 | Temp 98.1°F | Resp 16 | Ht 69.25 in | Wt 247.0 lb

## 2014-01-16 DIAGNOSIS — Z79899 Other long term (current) drug therapy: Secondary | ICD-10-CM | POA: Diagnosis not present

## 2014-01-16 DIAGNOSIS — I10 Essential (primary) hypertension: Secondary | ICD-10-CM | POA: Diagnosis not present

## 2014-01-16 DIAGNOSIS — Z8619 Personal history of other infectious and parasitic diseases: Secondary | ICD-10-CM | POA: Diagnosis not present

## 2014-01-16 DIAGNOSIS — N179 Acute kidney failure, unspecified: Secondary | ICD-10-CM

## 2014-01-16 DIAGNOSIS — N529 Male erectile dysfunction, unspecified: Secondary | ICD-10-CM

## 2014-01-16 DIAGNOSIS — E119 Type 2 diabetes mellitus without complications: Secondary | ICD-10-CM | POA: Diagnosis not present

## 2014-01-16 DIAGNOSIS — Z720 Tobacco use: Secondary | ICD-10-CM | POA: Insufficient documentation

## 2014-01-16 DIAGNOSIS — B1920 Unspecified viral hepatitis C without hepatic coma: Secondary | ICD-10-CM | POA: Insufficient documentation

## 2014-01-16 DIAGNOSIS — R1032 Left lower quadrant pain: Secondary | ICD-10-CM | POA: Insufficient documentation

## 2014-01-16 DIAGNOSIS — R7989 Other specified abnormal findings of blood chemistry: Secondary | ICD-10-CM | POA: Diagnosis present

## 2014-01-16 LAB — BASIC METABOLIC PANEL
ANION GAP: 19 — AB (ref 5–15)
BUN: 36 mg/dL — ABNORMAL HIGH (ref 6–23)
CHLORIDE: 100 meq/L (ref 96–112)
CO2: 19 mEq/L (ref 19–32)
CREATININE: 6 mg/dL — AB (ref 0.50–1.35)
Calcium: 9.2 mg/dL (ref 8.4–10.5)
GFR calc non Af Amer: 9 mL/min — ABNORMAL LOW (ref >90)
GFR, EST AFRICAN AMERICAN: 11 mL/min — AB (ref >90)
Glucose, Bld: 143 mg/dL — ABNORMAL HIGH (ref 70–99)
POTASSIUM: 3.9 meq/L (ref 3.7–5.3)
Sodium: 138 mEq/L (ref 137–147)

## 2014-01-16 LAB — URINALYSIS, ROUTINE W REFLEX MICROSCOPIC
Bilirubin Urine: NEGATIVE
Glucose, UA: NEGATIVE mg/dL
Ketones, ur: NEGATIVE mg/dL
Leukocytes, UA: NEGATIVE
NITRITE: NEGATIVE
Protein, ur: 30 mg/dL — AB
SPECIFIC GRAVITY, URINE: 1.01 (ref 1.005–1.030)
UROBILINOGEN UA: 0.2 mg/dL (ref 0.0–1.0)
pH: 5 (ref 5.0–8.0)

## 2014-01-16 LAB — CBC WITH DIFFERENTIAL/PLATELET
BASOS ABS: 0 10*3/uL (ref 0.0–0.1)
BASOS PCT: 0 % (ref 0–1)
Eosinophils Absolute: 0 10*3/uL (ref 0.0–0.7)
Eosinophils Relative: 1 % (ref 0–5)
HCT: 40.8 % (ref 39.0–52.0)
HEMOGLOBIN: 13.8 g/dL (ref 13.0–17.0)
Lymphocytes Relative: 32 % (ref 12–46)
Lymphs Abs: 1.8 10*3/uL (ref 0.7–4.0)
MCH: 28.9 pg (ref 26.0–34.0)
MCHC: 33.8 g/dL (ref 30.0–36.0)
MCV: 85.5 fL (ref 78.0–100.0)
MONOS PCT: 15 % — AB (ref 3–12)
Monocytes Absolute: 0.9 10*3/uL (ref 0.1–1.0)
NEUTROS ABS: 2.9 10*3/uL (ref 1.7–7.7)
Neutrophils Relative %: 52 % (ref 43–77)
Platelets: 112 10*3/uL — ABNORMAL LOW (ref 150–400)
RBC: 4.77 MIL/uL (ref 4.22–5.81)
RDW: 13.2 % (ref 11.5–15.5)
WBC: 5.6 10*3/uL (ref 4.0–10.5)

## 2014-01-16 LAB — COMPREHENSIVE METABOLIC PANEL
ALBUMIN: 4 g/dL (ref 3.5–5.2)
ALK PHOS: 63 U/L (ref 39–117)
ALT: 155 U/L — ABNORMAL HIGH (ref 0–53)
AST: 105 U/L — ABNORMAL HIGH (ref 0–37)
BUN: 32 mg/dL — ABNORMAL HIGH (ref 6–23)
CO2: 21 meq/L (ref 19–32)
Calcium: 9.1 mg/dL (ref 8.4–10.5)
Chloride: 101 mEq/L (ref 96–112)
Creatinine, Ser: 5.4 mg/dL (ref 0.4–1.5)
GFR: 14.11 mL/min — CL (ref >60.00)
GLUCOSE: 106 mg/dL — AB (ref 70–99)
POTASSIUM: 3.8 meq/L (ref 3.5–5.1)
SODIUM: 135 meq/L (ref 135–145)
TOTAL PROTEIN: 7.5 g/dL (ref 6.0–8.3)
Total Bilirubin: 1 mg/dL (ref 0.2–1.2)

## 2014-01-16 LAB — MAGNESIUM: MAGNESIUM: 1.3 mg/dL — AB (ref 1.5–2.5)

## 2014-01-16 LAB — PHOSPHORUS: Phosphorus: 4.4 mg/dL (ref 2.3–4.6)

## 2014-01-16 LAB — URINE MICROSCOPIC-ADD ON

## 2014-01-16 LAB — HEPATITIS C RNA QUANTITATIVE

## 2014-01-16 MED ORDER — SILDENAFIL CITRATE 100 MG PO TABS: 100.0000 mg | ORAL_TABLET | ORAL | Status: DC | PRN

## 2014-01-16 NOTE — Telephone Encounter (Signed)
Received call from [place] at [facility] lab with a critical lab result on the pt.  Critical Creat:5.4 and GFR:14.11.  The critical results were verbally given to Elyn Aquas, [place] and pt was called with results and asked to go to the ER to be evaluated.  Pt verbalized understanding and agreed to go to the ER to be evaluated.//AB/CMA

## 2014-01-16 NOTE — ED Notes (Signed)
Pt states that he does not want to ride in an ambulance to the hospital, and that he now wants to be admitted to [facility]. Dr. Ralene Bathe alerted.

## 2014-01-16 NOTE — ED Notes (Signed)
MD at bedside. 

## 2014-01-16 NOTE — Progress Notes (Signed)
Patient presents to clinic today to discuss recent lab results.  Patient recently had Acute hepatic panel drawn and an [place] of his abdomen was performed.  Hepatitis panel + for Hepatitis C and acute hepatitis B infections.  HCV quantitative and typing could not be obtained as more blood was needed than initially given.  Patient now reveals he has a history of Hepatitis C.  This has never been mentioned at his visits when a comprehensive history has been obtained. Has never proceeded with treatment for this issue. 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  Denies abdominal pain, nausea or vomiting.  Denies jaundice or dark urine.  Does illicit extensive alcohol consumption recently.  US revealed hepatic steatosis.  Past Medical History  Diagnosis Date  . Hypertension   . Diabetes mellitus without complication   . Chicken pox   . Hepatitis C     Current Outpatient Prescriptions on File Prior to Visit  Medication Sig Dispense Refill  . glimepiride (AMARYL) 4 MG tablet Take 1 tablet (4 mg total) by mouth daily before breakfast. 30 tablet 0  . ibuprofen (ADVIL,MOTRIN) 200 MG tablet Take 200 mg by mouth every 6 (six) hours as needed.    Marland Kitchen losartan (COZAAR) 100 MG tablet Take 1 tablet (100 mg total) by mouth daily. 90 tablet 3  . metFORMIN (GLUCOPHAGE) 1000 MG tablet Take 1 tablet (1,000 mg total) by mouth 2 (two) times daily with a meal. 60 tablet 0  . sitaGLIPtin (JANUVIA) 100 MG tablet Take 1 tablet (100 mg total) by mouth daily. 30 tablet 0   No current facility-administered medications on file prior to visit.    No Known Allergies  Family History  Problem Relation Age of Onset  . Heart attack Father     Deceased  . Heart disease Father   . Diabetes Mother     Living  . Heart attack Brother 9    Deceased  . Sudden death Brother   . Thyroid disease Sister   . Diabetes Sister   . Healthy Sister     x1  . Colon cancer Brother     x1  . Heart disease  Maternal Grandfather   . Allergies Daughter   . Diabetes Son   . Diabetes Maternal Grandmother     History   Social History  . Marital Status: Divorced    Spouse Name: N/A    Number of Children: N/A  . Years of Education: N/A   Social History Main Topics  . Smoking status: Current Some Day Smoker -- 0.25 packs/day    Types: Cigarettes  . Smokeless tobacco: Never Used  . Alcohol Use: Yes  . Drug Use: No  . Sexual Activity: None   Other Topics Concern  . None   Social History Narrative   Review of Systems - See HPI.  All other ROS are negative.  BP 141/106 mmHg  Pulse 98  Temp(Src) 98.1 F (36.7 C) (Oral)  Resp 16  Ht 5' 9.25" (1.759 m)  Wt 247 lb (112.038 kg)  BMI 36.21 kg/m2  SpO2 99%  Physical Exam  Constitutional: He is oriented to person, place, and time and well-developed, well-nourished, and in no distress.  HENT:  Head: Normocephalic and atraumatic.  Right Ear: External ear normal.  Left Ear: External ear normal.  Eyes: Conjunctivae are normal.  Cardiovascular: Normal rate, regular rhythm, normal heart sounds and intact distal pulses.   Pulmonary/Chest: Effort normal and breath  sounds normal. No respiratory distress. He has no wheezes. He has no rales. He exhibits no tenderness.  Abdominal: Soft. Bowel sounds are normal. He exhibits no distension and no mass. There is no tenderness. There is no rebound and no guarding.  Neurological: He is alert and oriented to person, place, and time.  Skin: Skin is warm and dry. No rash noted.  Psychiatric: Affect normal.  Vitals reviewed.   Recent Results (from the past 2160 hour(s))  Comp Met (CMET)     Status: Abnormal   Collection Time: 12/20/13 10:11 AM  Result Value Ref Range   Sodium 141 135 - 145 mEq/L   Potassium 4.2 3.5 - 5.1 mEq/L   Chloride 103 96 - 112 mEq/L   CO2 25 19 - 32 mEq/L   Glucose, Bld 193 (H) 70 - 99 mg/dL   BUN 7 6 - 23 mg/dL   Creatinine, Ser 1.0 0.4 - 1.5 mg/dL   Total Bilirubin 0.6  0.2 - 1.2 mg/dL   Alkaline Phosphatase 74 39 - 117 U/L   AST 190 (H) 0 - 37 U/L   ALT 232 (H) 0 - 53 U/L   Total Protein 7.7 6.0 - 8.3 g/dL   Albumin 4.3 3.5 - 5.2 g/dL   Calcium 9.3 8.4 - 10.5 mg/dL   GFR 94.07 >60.00 mL/min  Hemoglobin A1c     Status: Abnormal   Collection Time: 12/20/13 10:11 AM  Result Value Ref Range   Hgb A1c MFr Bld 10.5 (H) 4.6 - 6.5 %    Comment: Glycemic Control Guidelines for People with Diabetes:Non Diabetic:  <6%Goal of Therapy: <7%Additional Action Suggested:  >8%   Urinalysis, Routine w reflex microscopic     Status: Abnormal   Collection Time: 12/20/13 10:11 AM  Result Value Ref Range   Color, Urine YELLOW Yellow;Lt. Yellow   APPearance CLEAR Clear   Specific Gravity, Urine 1.025 1.000-1.030   pH 5.5 5.0 - 8.0   Total Protein, Urine TRACE (A) Negative   Urine Glucose NEGATIVE Negative   Ketones, ur NEGATIVE Negative   Bilirubin Urine NEGATIVE Negative   Hgb urine dipstick TRACE-LYSED (A) Negative   Urobilinogen, UA 0.2 0.0 - 1.0   Leukocytes, UA NEGATIVE Negative   Nitrite NEGATIVE Negative   WBC, UA none seen 0-2/hpf   RBC / HPF 0-2/hpf 0-2/hpf  Hepatitis, Acute     Status: Abnormal   Collection Time: 01/12/14  9:23 AM  Result Value Ref Range   Hepatitis B Surface Ag NEGATIVE NEGATIVE   HCV Ab REACTIVE (A) NEGATIVE   Hep B C IgM REACTIVE (A) NON REACTIVE    Comment: High levels of Hepatitis B Core IgM antibody are detectable during the acute stage of Hepatitis B. This antibody is used to differentiate current from past HBV infection.      Hep A IgM NON REACTIVE NON REACTIVE    Comment:   Effective December 12, 2013, Hepatitis Acute Panel (test code [id number]) will be revised to automatically reflex to the Hepatitis C Viral RNA, Quantitative, Real-Time PCR assay if the Hepatitis C antibody screening result is Reactive. This action is being taken to ensure that the CDC/USPSTF recommended HCV diagnostic algorithm with the appropriate test  reflex needed for accurate interpretation is followed.     Hepatitis C RNA quantitative     Status: None   Collection Time: 01/12/14  9:23 AM  Result Value Ref Range   HCV Quantitative CANCELED <15 IU/mL    Comment: QNS FOR HCV  QUANT  Result canceled by the ancillary    HCV Quantitative Log CANCELED <1.18 log 10    Comment:   This test utilizes the [place] FDA approved Roche HCV Test Kit by RT-PCR.  Result canceled by the ancillary     Assessment/Plan: Hepatitis, viral + HBV and HCV.  Will check repeat LFTs.  Will also get sample for HCV Quantitative. Stop alcohol consumption.  Increase fluids.  Get plenty of rest.  Referral placed to GI.

## 2014-01-16 NOTE — ED Notes (Signed)
Abnormal labs from this am. He was told to come here.

## 2014-01-16 NOTE — ED Notes (Signed)
Pt left ed amb with wife, verbalizes understanding of need to present to [facility] asap for admission to room 763. Pt and wife verbalize understanding.

## 2014-01-16 NOTE — Progress Notes (Signed)
Pre visit review using our clinic review tool, if applicable. No additional management support is needed unless otherwise documented below in the visit note/SLS  

## 2014-01-16 NOTE — Telephone Encounter (Signed)
[name] [phone]  Casey Reyes called back wanting to talk with Casey Reyes immediately, I told him I would need to take a message because Casey Reyes was in clinic today. He stated he was on his way to emergency room per his request thru whom ever called him.

## 2014-01-16 NOTE — Assessment & Plan Note (Signed)
+   HBV and HCV.  Will check repeat LFTs.  Will also get sample for HCV Quantitative. Stop alcohol consumption.  Increase fluids.  Get plenty of rest.  Referral placed to GI.

## 2014-01-16 NOTE — Telephone Encounter (Signed)
Already handled.  See Note

## 2014-01-16 NOTE — ED Provider Notes (Signed)
CSN: [id number]     Arrival date & time 01/16/14  1555 History   This chart was scribed for Quintella Reichert, MD by [facility], ED Scribe. The patient was seen in [place] and the patient's care was started at 4:06 PM.  Chief Complaint  Patient presents with  . abnormal labs    The history is provided by the patient. No language interpreter was used.    HPI Comments: Neshawn Aird is a 58 y.o. male with a history of HTN and DM who presents to the Emergency Department complaining of abnormal liver and kidney labs. Pt was seen today by Dr. Einar Pheasant for a Hepatitis panel. He was told to come to the ED for abnormal labs. Pt has chills, left sided abdominal pain. He denies fever, vomiting, SOB, dysuria, urinary frequency, and swelling in extremities.  Pt began taking losartan around Thanksgiving for HTN. Pt doe not drink alcohol, denies illicit drug use. Past Medical History  Diagnosis Date  . Hypertension   . Diabetes mellitus without complication   . Chicken pox   . Hepatitis C    Past Surgical History  Procedure Laterality Date  . Finger surgery      Right-Tendon Cut  . Wisdom tooth extraction     Family History  Problem Relation Age of Onset  . Heart attack Father     Deceased  . Heart disease Father   . Diabetes Mother     Living  . Heart attack Brother 48    Deceased  . Sudden death Brother   . Thyroid disease Sister   . Diabetes Sister   . Healthy Sister     x1  . Colon cancer Brother     x1  . Heart disease Maternal Grandfather   . Allergies Daughter   . Diabetes Son   . Diabetes Maternal Grandmother    History  Substance Use Topics  . Smoking status: Current Some Day Smoker -- 0.25 packs/day    Types: Cigarettes  . Smokeless tobacco: Never Used  . Alcohol Use: Yes    Review of Systems  Constitutional: Positive for chills. Negative for fever.  Respiratory: Negative for shortness of breath.   Cardiovascular: Negative for leg swelling.  Gastrointestinal:  Positive for abdominal pain. Negative for vomiting.  Genitourinary: Negative for dysuria, frequency and decreased urine volume.   Allergies  Review of patient's allergies indicates no known allergies.  Home Medications   Prior to Admission medications   Medication Sig Start Date End Date Taking? Authorizing Provider  glimepiride (AMARYL) 4 MG tablet Take 1 tablet (4 mg total) by mouth daily before breakfast. 12/20/13   Brunetta Jeans, PA-C  ibuprofen (ADVIL,MOTRIN) 200 MG tablet Take 200 mg by mouth every 6 (six) hours as needed.    Historical Provider, MD  loratadine (CLARITIN) 10 MG tablet Take 10 mg by mouth daily as needed for allergies.    Historical Provider, MD  losartan (COZAAR) 100 MG tablet Take 1 tablet (100 mg total) by mouth daily. 12/20/13   Brunetta Jeans, PA-C  metFORMIN (GLUCOPHAGE) 1000 MG tablet Take 1 tablet (1,000 mg total) by mouth 2 (two) times daily with a meal. 12/20/13   Brunetta Jeans, PA-C  sildenafil (VIAGRA) 100 MG tablet Take 1 tablet (100 mg total) by mouth as needed for erectile dysfunction. 01/16/14   Brunetta Jeans, PA-C  sitaGLIPtin (JANUVIA) 100 MG tablet Take 1 tablet (100 mg total) by mouth daily. 12/20/13   Brunetta Jeans,  PA-C   BP 167/100 mmHg  Pulse 100  Temp(Src) 97.8 F (36.6 C) (Oral)  Resp 18  Ht 5\' 7"  (1.702 m)  Wt 247 lb (112.038 kg)  BMI 38.68 kg/m2  SpO2 100% Physical Exam  Constitutional: He is oriented to person, place, and time. He appears well-developed and well-nourished.  HENT:  Head: Normocephalic and atraumatic.  Cardiovascular: Normal rate.   No murmur heard. Pulmonary/Chest: Effort normal. No respiratory distress.  Abdominal: Soft. There is no rebound and no guarding.  Mild LLQ tenderness without guarding/rebound  Musculoskeletal: He exhibits no edema or tenderness.  Neurological: He is alert and oriented to person, place, and time.  Skin: Skin is warm and dry.  Psychiatric: He has a normal mood and affect.  His behavior is normal.  Nursing note and vitals reviewed.   ED Course  Procedures  DIAGNOSTIC STUDIES: Oxygen Saturation is 100% on room air, normal by my interpretation.    COORDINATION OF CARE: 4:14 PM Discussed treatment plan with pt at bedside and pt agreed to plan.  Labs Review Labs Reviewed  BASIC METABOLIC PANEL - Abnormal; Notable for the following:    Glucose, Bld 143 (*)    BUN 36 (*)    Creatinine, Ser 6.00 (*)    GFR calc non Af Amer 9 (*)    GFR calc Af Amer 11 (*)    Anion gap 19 (*)    All other components within normal limits  CBC WITH DIFFERENTIAL - Abnormal; Notable for the following:    Platelets 112 (*)    Monocytes Relative 15 (*)    All other components within normal limits  URINALYSIS, ROUTINE W REFLEX MICROSCOPIC - Abnormal; Notable for the following:    Hgb urine dipstick SMALL (*)    Protein, ur 30 (*)    All other components within normal limits  MAGNESIUM - Abnormal; Notable for the following:    Magnesium 1.3 (*)    All other components within normal limits  URINE MICROSCOPIC-ADD ON - Abnormal; Notable for the following:    Bacteria, UA FEW (*)    All other components within normal limits  PHOSPHORUS    Imaging Review Dg Chest 2 View  01/16/2014   CLINICAL DATA:  Initial evaluation for acute renal failure.  EXAM: CHEST  2 VIEW  COMPARISON:  None.  FINDINGS: The cardiac and mediastinal silhouettes are within normal limits.  The lungs are normally inflated. Minimal left perihilar atelectasis present. No airspace consolidation, pleural effusion, or pulmonary edema is identified. There is no pneumothorax.  No acute osseous abnormality identified.  IMPRESSION: 1. No active cardiopulmonary disease. 2. Minimal left perihilar atelectasis/scarring.   Electronically Signed   By: Jeannine Boga M.D.   On: 01/16/2014 17:06     EKG Interpretation   Date/Time:  Monday January 16 2014 16:18:03 EST Ventricular Rate:  101 PR Interval:  124 QRS  Duration: 88 QT Interval:  364 QTC Calculation: 471 R Axis:   29 Text Interpretation:  Sinus tachycardia Otherwise normal ECG Confirmed by  Hazle Coca [phone]) on 01/16/2014 4:21:52 PM      MDM   Final diagnoses:  Acute renal failure, unspecified acute renal failure type    Patient here for evaluation of abnormal labs. Reviewed labs from earlier today, patient with acute renal failure with elevated creatinine, normal electrolytes. Labs rechecked that demonstrate continued elevated creatinine, no other major electrolyte abnormalities. Patient is euvolemic on exam. Discussed with patient recommendation for admission for acute renal failure.  Patient initially agreed to [facility], but then the patient decided that he would rather go to [facility] regional for further evaluation and admission. Patient refuses EMS transport. Discussed with Dr.Gandala, at [facility] regarding admission and he agrees to see the patient in transfer.    Quintella Reichert, MD 01/16/14 1850

## 2014-01-16 NOTE — ED Notes (Signed)
Pt. Reports he has alcohol every day a cocktail and then lately he has slowed down to every couple of days.  No withdrawal noted.  Pt. Is very thirsty and has history of Hep. C

## 2014-01-16 NOTE — Patient Instructions (Signed)
Stop by the lab for additional blood work. I will call you with your results.  STOP all alcohol consumption.  You will be contacted by a Hepatitis Specialist for evaluation and treatment.  Stay well hydrated and continue medications as directed.

## 2014-01-17 LAB — HEPATITIS C RNA QUANTITATIVE
HCV QUANT: 10183870 [IU]/mL — AB (ref <15)
HCV Quantitative Log: 7.01 {Log} — ABNORMAL HIGH (ref <1.18)

## 2014-01-25 ENCOUNTER — Encounter: Payer: Self-pay | Admitting: Physician Assistant

## 2014-01-29 ENCOUNTER — Other Ambulatory Visit: Payer: Self-pay | Admitting: Physician Assistant

## 2014-01-30 ENCOUNTER — Encounter: Payer: Self-pay | Admitting: Physician Assistant

## 2014-01-30 ENCOUNTER — Ambulatory Visit (INDEPENDENT_AMBULATORY_CARE_PROVIDER_SITE_OTHER): Payer: 59 | Admitting: Physician Assistant

## 2014-01-30 VITALS — BP 147/98 | HR 92 | Temp 98.2°F | Resp 20 | Ht 69.25 in | Wt 241.1 lb

## 2014-01-30 DIAGNOSIS — I10 Essential (primary) hypertension: Secondary | ICD-10-CM

## 2014-01-30 DIAGNOSIS — E1165 Type 2 diabetes mellitus with hyperglycemia: Secondary | ICD-10-CM

## 2014-01-30 DIAGNOSIS — IMO0002 Reserved for concepts with insufficient information to code with codable children: Secondary | ICD-10-CM

## 2014-01-30 DIAGNOSIS — N179 Acute kidney failure, unspecified: Secondary | ICD-10-CM

## 2014-01-30 LAB — CBC
HEMATOCRIT: 45.7 % (ref 39.0–52.0)
HEMOGLOBIN: 14.9 g/dL (ref 13.0–17.0)
MCHC: 32.7 g/dL (ref 30.0–36.0)
MCV: 87.8 fl (ref 78.0–100.0)
PLATELETS: 177 10*3/uL (ref 150.0–400.0)
RBC: 5.21 Mil/uL (ref 4.22–5.81)
RDW: 13.1 % (ref 11.5–15.5)
WBC: 6.2 10*3/uL (ref 4.0–10.5)

## 2014-01-30 LAB — BASIC METABOLIC PANEL
BUN: 21 mg/dL (ref 6–23)
CO2: 23 meq/L (ref 19–32)
Calcium: 9.8 mg/dL (ref 8.4–10.5)
Chloride: 106 mEq/L (ref 96–112)
Creatinine, Ser: 1.3 mg/dL (ref 0.4–1.5)
GFR: 70.19 mL/min (ref >60.00)
Glucose, Bld: 117 mg/dL — ABNORMAL HIGH (ref 70–99)
Potassium: 4.2 mEq/L (ref 3.5–5.1)
Sodium: 139 mEq/L (ref 135–145)

## 2014-01-30 MED ORDER — GLIMEPIRIDE 1 MG PO TABS: 1.0000 mg | ORAL_TABLET | Freq: Every day | ORAL | Status: DC

## 2014-01-30 MED ORDER — AMLODIPINE BESYLATE 10 MG PO TABS: 10.0000 mg | ORAL_TABLET | Freq: Every day | ORAL | Status: DC

## 2014-01-30 NOTE — Progress Notes (Signed)
Pre visit review using our clinic review tool, if applicable. No additional management support is needed unless otherwise documented below in the visit note/SLS  

## 2014-01-30 NOTE — Patient Instructions (Signed)
Start the amlodipine for BP taking daily as directed.  Also start the Amaryl 1 mg tablet, taking once daily. Please do not take old medications.  Only take these medications.  Avoid use of anti-inflammatories like Ibuprofen, Advil, Motrin.    I will call you with your lab results.  Follow-up with me in 1 week.  Bring blood sugar machine with me.

## 2014-02-06 NOTE — Progress Notes (Signed)
Patient presents to clinic today for hospital follow-up.  Patient admitted to [facility] for Acute renal failure after Creatinine was found to be significantly elevated on routine blood work here in office.  Patient left AMA on 01/18/14 before source of renal failure could be found.  Was instructed to stop his ACEI and metformin.  Since discharge patient states that he has stopped all of his medications. Was given amlodipine to take for BP but states he has not been taking it.  Did take Metformin this morning because he blood sugar was high.  Was set up with Neprhology and Hepatology for further management but has not scheduled his appointments yet.  Denies abdominal pain.  Endorses good urinary output.  Denies dysuria or hematuria.  Past Medical History  Diagnosis Date  . Hypertension   . Diabetes mellitus without complication   . Chicken pox   . Hepatitis C     Current Outpatient Prescriptions on File Prior to Visit  Medication Sig Dispense Refill  . ibuprofen (ADVIL,MOTRIN) 200 MG tablet Take 200 mg by mouth every 6 (six) hours as needed.    . loratadine (CLARITIN) 10 MG tablet Take 10 mg by mouth daily as needed for allergies.     No current facility-administered medications on file prior to visit.    No Known Allergies  Family History  Problem Relation Age of Onset  . Heart attack Father     Deceased  . Heart disease Father   . Diabetes Mother     Living  . Heart attack Brother 26    Deceased  . Sudden death Brother   . Thyroid disease Sister   . Diabetes Sister   . Healthy Sister     x1  . Colon cancer Brother     x1  . Heart disease Maternal Grandfather   . Allergies Daughter   . Diabetes Son   . Diabetes Maternal Grandmother     History   Social History  . Marital Status: Divorced    Spouse Name: N/A    Number of Children: N/A  . Years of Education: N/A   Social History Main Topics  . Smoking status: Current Some Day Smoker -- 0.25 packs/day   Types: Cigarettes  . Smokeless tobacco: Never Used  . Alcohol Use: Yes  . Drug Use: No  . Sexual Activity: None   Other Topics Concern  . None   Social History Narrative   Review of Systems - See HPI.  All other ROS are negative.  BP 147/98 mmHg  Pulse 92  Temp(Src) 98.2 F (36.8 C) (Oral)  Resp 20  Ht 5' 9.25" (1.759 m)  Wt 241 lb 2 oz (109.374 kg)  BMI 35.35 kg/m2  SpO2 99%  Physical Exam  Constitutional: He is oriented to person, place, and time and well-developed, well-nourished, and in no distress.  HENT:  Head: Normocephalic and atraumatic.  Eyes: Conjunctivae are normal. Pupils are equal, round, and reactive to light.  Neck: Neck supple.  Cardiovascular: Normal rate, regular rhythm, normal heart sounds and intact distal pulses.   Pulmonary/Chest: Effort normal and breath sounds normal. No respiratory distress. He has no wheezes. He has no rales. He exhibits no tenderness.  Abdominal: Soft. Bowel sounds are normal. He exhibits no distension and no mass. There is no tenderness. There is no rebound and no guarding.  Lymphadenopathy:    He has no cervical adenopathy.  Neurological: He is alert and oriented to person, place, and time.  Skin: Skin is warm and dry. No rash noted.  Psychiatric: Affect normal.  Vitals reviewed.   Recent Results (from the past 2160 hour(s))  Comp Met (CMET)     Status: Abnormal   Collection Time: 12/20/13 10:11 AM  Result Value Ref Range   Sodium 141 135 - 145 mEq/L   Potassium 4.2 3.5 - 5.1 mEq/L   Chloride 103 96 - 112 mEq/L   CO2 25 19 - 32 mEq/L   Glucose, Bld 193 (H) 70 - 99 mg/dL   BUN 7 6 - 23 mg/dL   Creatinine, Ser 1.0 0.4 - 1.5 mg/dL   Total Bilirubin 0.6 0.2 - 1.2 mg/dL   Alkaline Phosphatase 74 39 - 117 U/L   AST 190 (H) 0 - 37 U/L   ALT 232 (H) 0 - 53 U/L   Total Protein 7.7 6.0 - 8.3 g/dL   Albumin 4.3 3.5 - 5.2 g/dL   Calcium 9.3 8.4 - 10.5 mg/dL   GFR 94.07 >60.00 mL/min  Hemoglobin A1c     Status: Abnormal    Collection Time: 12/20/13 10:11 AM  Result Value Ref Range   Hgb A1c MFr Bld 10.5 (H) 4.6 - 6.5 %    Comment: Glycemic Control Guidelines for People with Diabetes:Non Diabetic:  <6%Goal of Therapy: <7%Additional Action Suggested:  >8%   Urinalysis, Routine w reflex microscopic     Status: Abnormal   Collection Time: 12/20/13 10:11 AM  Result Value Ref Range   Color, Urine YELLOW Yellow;Lt. Yellow   APPearance CLEAR Clear   Specific Gravity, Urine 1.025 1.000-1.030   pH 5.5 5.0 - 8.0   Total Protein, Urine TRACE (A) Negative   Urine Glucose NEGATIVE Negative   Ketones, ur NEGATIVE Negative   Bilirubin Urine NEGATIVE Negative   Hgb urine dipstick TRACE-LYSED (A) Negative   Urobilinogen, UA 0.2 0.0 - 1.0   Leukocytes, UA NEGATIVE Negative   Nitrite NEGATIVE Negative   WBC, UA none seen 0-2/hpf   RBC / HPF 0-2/hpf 0-2/hpf  Hepatitis, Acute     Status: Abnormal   Collection Time: 01/12/14  9:23 AM  Result Value Ref Range   Hepatitis B Surface Ag NEGATIVE NEGATIVE   HCV Ab REACTIVE (A) NEGATIVE   Hep B C IgM REACTIVE (A) NON REACTIVE    Comment: High levels of Hepatitis B Core IgM antibody are detectable during the acute stage of Hepatitis B. This antibody is used to differentiate current from past HBV infection.      Hep A IgM NON REACTIVE NON REACTIVE    Comment:   Effective December 12, 2013, Hepatitis Acute Panel (test code [phone]) will be revised to automatically reflex to the Hepatitis C Viral RNA, Quantitative, Real-Time PCR assay if the Hepatitis C antibody screening result is Reactive. This action is being taken to ensure that the CDC/USPSTF recommended HCV diagnostic algorithm with the appropriate test reflex needed for accurate interpretation is followed.     Hepatitis C RNA quantitative     Status: None   Collection Time: 01/12/14  9:23 AM  Result Value Ref Range   HCV Quantitative CANCELED <15 IU/mL    Comment: QNS FOR HCV QUANT  Result canceled by the  ancillary    HCV Quantitative Log CANCELED <1.18 log 10    Comment:   This test utilizes the [place] FDA approved Roche HCV Test Kit by RT-PCR.  Result canceled by the ancillary   Comp Met (CMET)     Status: Abnormal  Collection Time: 01/16/14 10:57 AM  Result Value Ref Range   Sodium 135 135 - 145 mEq/L   Potassium 3.8 3.5 - 5.1 mEq/L   Chloride 101 96 - 112 mEq/L   CO2 21 19 - 32 mEq/L   Glucose, Bld 106 (H) 70 - 99 mg/dL   BUN 32 (H) 6 - 23 mg/dL   Creatinine, Ser 5.4 (HH) 0.4 - 1.5 mg/dL   Total Bilirubin 1.0 0.2 - 1.2 mg/dL   Alkaline Phosphatase 63 39 - 117 U/L   AST 105 (H) 0 - 37 U/L   ALT 155 (H) 0 - 53 U/L   Total Protein 7.5 6.0 - 8.3 g/dL   Albumin 4.0 3.5 - 5.2 g/dL   Calcium 9.1 8.4 - 10.5 mg/dL   GFR 14.11 (LL) >60.00 mL/min  Hepatitis C RNA quantitative     Status: Abnormal   Collection Time: 01/16/14 10:57 AM  Result Value Ref Range   HCV Quantitative [id number] (H) <15 IU/mL   HCV Quantitative Log 7.01 (H) <1.18 log 10    Comment:   This test utilizes the [place] FDA approved Roche HCV Test Kit by RT-PCR.   Basic metabolic panel     Status: Abnormal   Collection Time: 01/16/14  4:35 PM  Result Value Ref Range   Sodium 138 137 - 147 mEq/L   Potassium 3.9 3.7 - 5.3 mEq/L   Chloride 100 96 - 112 mEq/L   CO2 19 19 - 32 mEq/L   Glucose, Bld 143 (H) 70 - 99 mg/dL   BUN 36 (H) 6 - 23 mg/dL   Creatinine, Ser 6.00 (H) 0.50 - 1.35 mg/dL   Calcium 9.2 8.4 - 10.5 mg/dL   GFR calc non Af Amer 9 (L) >90 mL/min   GFR calc Af Amer 11 (L) >90 mL/min    Comment: (NOTE) The eGFR has been calculated using the CKD EPI equation. This calculation has not been validated in all clinical situations. eGFR's persistently <90 mL/min signify possible Chronic Kidney Disease.    Anion gap 19 (H) 5 - 15  CBC with Differential     Status: Abnormal   Collection Time: 01/16/14  4:35 PM  Result Value Ref Range   WBC 5.6 4.0 - 10.5 K/uL   RBC 4.77 4.22 - 5.81 MIL/uL   Hemoglobin 13.8 13.0  - 17.0 g/dL   HCT 40.8 39.0 - 52.0 %   MCV 85.5 78.0 - 100.0 fL   MCH 28.9 26.0 - 34.0 pg   MCHC 33.8 30.0 - 36.0 g/dL   RDW 13.2 11.5 - 15.5 %   Platelets 112 (L) 150 - 400 K/uL    Comment: SPECIMEN CHECKED FOR CLOTS PLATELET COUNT CONFIRMED BY SMEAR    Neutrophils Relative % 52 43 - 77 %   Neutro Abs 2.9 1.7 - 7.7 K/uL   Lymphocytes Relative 32 12 - 46 %   Lymphs Abs 1.8 0.7 - 4.0 K/uL   Monocytes Relative 15 (H) 3 - 12 %   Monocytes Absolute 0.9 0.1 - 1.0 K/uL   Eosinophils Relative 1 0 - 5 %   Eosinophils Absolute 0.0 0.0 - 0.7 K/uL   Basophils Relative 0 0 - 1 %   Basophils Absolute 0.0 0.0 - 0.1 K/uL  Magnesium     Status: Abnormal   Collection Time: 01/16/14  4:35 PM  Result Value Ref Range   Magnesium 1.3 (L) 1.5 - 2.5 mg/dL  Phosphorus     Status: None  Collection Time: 01/16/14  4:35 PM  Result Value Ref Range   Phosphorus 4.4 2.3 - 4.6 mg/dL  Urinalysis, Routine w reflex microscopic     Status: Abnormal   Collection Time: 01/16/14  5:26 PM  Result Value Ref Range   Color, Urine YELLOW YELLOW   APPearance CLEAR CLEAR   Specific Gravity, Urine 1.010 1.005 - 1.030   pH 5.0 5.0 - 8.0   Glucose, UA NEGATIVE NEGATIVE mg/dL   Hgb urine dipstick SMALL (A) NEGATIVE   Bilirubin Urine NEGATIVE NEGATIVE   Ketones, ur NEGATIVE NEGATIVE mg/dL   Protein, ur 30 (A) NEGATIVE mg/dL   Urobilinogen, UA 0.2 0.0 - 1.0 mg/dL   Nitrite NEGATIVE NEGATIVE   Leukocytes, UA NEGATIVE NEGATIVE  Urine microscopic-add on     Status: Abnormal   Collection Time: 01/16/14  5:26 PM  Result Value Ref Range   Squamous Epithelial / LPF RARE RARE   RBC / HPF 3-6 <3 RBC/hpf   Bacteria, UA FEW (A) RARE  CBC     Status: None   Collection Time: 01/30/14 12:39 PM  Result Value Ref Range   WBC 6.2 4.0 - 10.5 K/uL   RBC 5.21 4.22 - 5.81 Mil/uL   Platelets 177.0 150.0 - 400.0 K/uL   Hemoglobin 14.9 13.0 - 17.0 g/dL   HCT 45.7 39.0 - 52.0 %   MCV 87.8 78.0 - 100.0 fl   MCHC 32.7 30.0 - 36.0 g/dL    RDW 13.1 11.5 - 35.3 %  Basic Metabolic Panel (BMET)     Status: Abnormal   Collection Time: 01/30/14 12:39 PM  Result Value Ref Range   Sodium 139 135 - 145 mEq/L   Potassium 4.2 3.5 - 5.1 mEq/L   Chloride 106 96 - 112 mEq/L   CO2 23 19 - 32 mEq/L   Glucose, Bld 117 (H) 70 - 99 mg/dL   BUN 21 6 - 23 mg/dL   Creatinine, Ser 1.3 0.4 - 1.5 mg/dL   Calcium 9.8 8.4 - 10.5 mg/dL   GFR 70.19 >60.00 mL/min    Assessment/Plan: Essential hypertension Rx Amlodipine 10 mg to take daily.  Will stay off of losartan for now.  Repeat BMP today.  Follow-up in 2 weeks.   Diabetes mellitus type II, uncontrolled Will repeat BMP.  Will restart glimepiride 1 mg daily for now.  Strict glucose measurement instructions given.  Continue carb modified diet. Follow-up in 2 weeks.   Acute renal failure syndrome Repeat labs today.  Unclear where we stand presently since patient left AMA before workup was complete. ARB, Metformin and Januvia on hold until renal function can be reassessed.   See A/P for diabetes for further detail regarding glycemic control.

## 2014-02-13 DIAGNOSIS — N179 Acute kidney failure, unspecified: Secondary | ICD-10-CM | POA: Insufficient documentation

## 2014-02-13 NOTE — Assessment & Plan Note (Signed)
Repeat labs today.  Unclear where we stand presently since patient left AMA before workup was complete. ARB, Metformin and Januvia on hold until renal function can be reassessed.   See A/P for diabetes for further detail regarding glycemic control.

## 2014-02-13 NOTE — Assessment & Plan Note (Signed)
Rx Amlodipine 10 mg to take daily.  Will stay off of losartan for now.  Repeat BMP today.  Follow-up in 2 weeks.

## 2014-02-13 NOTE — Assessment & Plan Note (Signed)
Will repeat BMP.  Will restart glimepiride 1 mg daily for now.  Strict glucose measurement instructions given.  Continue carb modified diet. Follow-up in 2 weeks.

## 2014-03-25 ENCOUNTER — Other Ambulatory Visit: Payer: Self-pay | Admitting: Physician Assistant

## 2014-03-27 NOTE — Telephone Encounter (Signed)
Rx request to pharmacy/[facility]  

## 2014-04-04 ENCOUNTER — Encounter: Payer: Self-pay | Admitting: Physician Assistant

## 2014-04-04 ENCOUNTER — Ambulatory Visit (INDEPENDENT_AMBULATORY_CARE_PROVIDER_SITE_OTHER): Payer: Managed Care, Other (non HMO) | Admitting: Physician Assistant

## 2014-04-04 ENCOUNTER — Telehealth: Payer: Self-pay | Admitting: Physician Assistant

## 2014-04-04 VITALS — BP 173/103 | HR 89 | Temp 97.5°F | Resp 18 | Ht 69.25 in | Wt 247.8 lb

## 2014-04-04 DIAGNOSIS — IMO0002 Reserved for concepts with insufficient information to code with codable children: Secondary | ICD-10-CM

## 2014-04-04 DIAGNOSIS — E1165 Type 2 diabetes mellitus with hyperglycemia: Secondary | ICD-10-CM

## 2014-04-04 MED ORDER — TAMSULOSIN HCL 0.4 MG PO CAPS: ORAL_CAPSULE | ORAL | Status: DC

## 2014-04-04 MED ORDER — GLIMEPIRIDE 2 MG PO TABS: 2.0000 mg | ORAL_TABLET | Freq: Every day | ORAL | Status: DC

## 2014-04-04 MED ORDER — LISINOPRIL 10 MG PO TABS: 10.0000 mg | ORAL_TABLET | Freq: Every day | ORAL | Status: DC

## 2014-04-04 MED ORDER — GABAPENTIN 100 MG PO CAPS: ORAL_CAPSULE | ORAL | Status: DC

## 2014-04-04 NOTE — Telephone Encounter (Signed)
Caller name: Krishav, Mamone Relation to pt: self  Call back number: [phone]   Reason for call:  Pt requesting a refill flomax

## 2014-04-04 NOTE — Progress Notes (Signed)
Patient presents to clinic today for follow-up of type 2 diabetes, uncontrolled. Patient currently on Amaryl 1 mg daily. Was recently removed off of metformin due to acute renal failure. Endorses a.m. fasting sugars averaging greater than 150. Endorses some numbness and tingling in his feet bilaterally for the past 2 months. Patient denies recent trauma or injury. Denies history of vitamin B12 deficiency.  Past Medical History  Diagnosis Date  . Hypertension   . Diabetes mellitus without complication   . Chicken pox   . Hepatitis C     Current Outpatient Prescriptions on File Prior to Visit  Medication Sig Dispense Refill  . ibuprofen (ADVIL,MOTRIN) 200 MG tablet Take 200 mg by mouth every 6 (six) hours as needed.    . loratadine (CLARITIN) 10 MG tablet Take 10 mg by mouth daily as needed for allergies.     No current facility-administered medications on file prior to visit.    No Known Allergies  Family History  Problem Relation Age of Onset  . Heart attack Father     Deceased  . Heart disease Father   . Diabetes Mother     Living  . Heart attack Brother 28    Deceased  . Sudden death Brother   . Thyroid disease Sister   . Diabetes Sister   . Healthy Sister     x1  . Colon cancer Brother     x1  . Heart disease Maternal Grandfather   . Allergies Daughter   . Diabetes Son   . Diabetes Maternal Grandmother     History   Social History  . Marital Status: Divorced    Spouse Name: N/A  . Number of Children: N/A  . Years of Education: N/A   Social History Main Topics  . Smoking status: Current Some Day Smoker -- 0.25 packs/day    Types: Cigarettes  . Smokeless tobacco: Never Used  . Alcohol Use: Yes  . Drug Use: No  . Sexual Activity: Not on file   Other Topics Concern  . None   Social History Narrative    Review of Systems - See HPI.  All other ROS are negative.  BP 173/103 mmHg  Pulse 89  Temp(Src) 97.5 F (36.4 C) (Oral)  Resp 18  Ht 5' 9.25"  (1.759 m)  Wt 247 lb 12.8 oz (112.401 kg)  BMI 36.33 kg/m2  SpO2 95%  Physical Exam  Constitutional: He is oriented to person, place, and time and well-developed, well-nourished, and in no distress.  HENT:  Head: Normocephalic and atraumatic.  Eyes: Conjunctivae are normal. Pupils are equal, round, and reactive to light.  Neck: Neck supple.  Cardiovascular: Normal rate, regular rhythm, normal heart sounds and intact distal pulses.   Pulmonary/Chest: Effort normal and breath sounds normal. No respiratory distress. He has no wheezes. He has no rales. He exhibits no tenderness.  Neurological: He is alert and oriented to person, place, and time.  Skin: Skin is warm and dry. No rash noted.  Psychiatric: Affect normal.  Vitals reviewed.   Recent Results (from the past 2160 hour(s))  Hepatitis, Acute     Status: Abnormal   Collection Time: 01/12/14  9:23 AM  Result Value Ref Range   Hepatitis B Surface Ag NEGATIVE NEGATIVE   HCV Ab REACTIVE (A) NEGATIVE   Hep B C IgM REACTIVE (A) NON REACTIVE    Comment: High levels of Hepatitis B Core IgM antibody are detectable during the acute stage of Hepatitis B. This antibody  is used to differentiate current from past HBV infection.      Hep A IgM NON REACTIVE NON REACTIVE    Comment:   Effective December 12, 2013, Hepatitis Acute Panel (test code [phone]) will be revised to automatically reflex to the Hepatitis C Viral RNA, Quantitative, Real-Time PCR assay if the Hepatitis C antibody screening result is Reactive. This action is being taken to ensure that the CDC/USPSTF recommended HCV diagnostic algorithm with the appropriate test reflex needed for accurate interpretation is followed.     Hepatitis C RNA quantitative     Status: None   Collection Time: 01/12/14  9:23 AM  Result Value Ref Range   HCV Quantitative CANCELED <15 IU/mL    Comment: QNS FOR HCV QUANT  Result canceled by the ancillary    HCV Quantitative Log CANCELED <1.18  log 10    Comment:   This test utilizes the [place] FDA approved Roche HCV Test Kit by RT-PCR.  Result canceled by the ancillary   Comp Met (CMET)     Status: Abnormal   Collection Time: 01/16/14 10:57 AM  Result Value Ref Range   Sodium 135 135 - 145 mEq/L   Potassium 3.8 3.5 - 5.1 mEq/L   Chloride 101 96 - 112 mEq/L   CO2 21 19 - 32 mEq/L   Glucose, Bld 106 (H) 70 - 99 mg/dL   BUN 32 (H) 6 - 23 mg/dL   Creatinine, Ser 5.4 (HH) 0.4 - 1.5 mg/dL   Total Bilirubin 1.0 0.2 - 1.2 mg/dL   Alkaline Phosphatase 63 39 - 117 U/L   AST 105 (H) 0 - 37 U/L   ALT 155 (H) 0 - 53 U/L   Total Protein 7.5 6.0 - 8.3 g/dL   Albumin 4.0 3.5 - 5.2 g/dL   Calcium 9.1 8.4 - 10.5 mg/dL   GFR 14.11 (LL) >60.00 mL/min  Hepatitis C RNA quantitative     Status: Abnormal   Collection Time: 01/16/14 10:57 AM  Result Value Ref Range   HCV Quantitative [id number] (H) <15 IU/mL   HCV Quantitative Log 7.01 (H) <1.18 log 10    Comment:   This test utilizes the [place] FDA approved Roche HCV Test Kit by RT-PCR.   Basic metabolic panel     Status: Abnormal   Collection Time: 01/16/14  4:35 PM  Result Value Ref Range   Sodium 138 137 - 147 mEq/L   Potassium 3.9 3.7 - 5.3 mEq/L   Chloride 100 96 - 112 mEq/L   CO2 19 19 - 32 mEq/L   Glucose, Bld 143 (H) 70 - 99 mg/dL   BUN 36 (H) 6 - 23 mg/dL   Creatinine, Ser 6.00 (H) 0.50 - 1.35 mg/dL   Calcium 9.2 8.4 - 10.5 mg/dL   GFR calc non Af Amer 9 (L) >90 mL/min   GFR calc Af Amer 11 (L) >90 mL/min    Comment: (NOTE) The eGFR has been calculated using the CKD EPI equation. This calculation has not been validated in all clinical situations. eGFR's persistently <90 mL/min signify possible Chronic Kidney Disease.    Anion gap 19 (H) 5 - 15  CBC with Differential     Status: Abnormal   Collection Time: 01/16/14  4:35 PM  Result Value Ref Range   WBC 5.6 4.0 - 10.5 K/uL   RBC 4.77 4.22 - 5.81 MIL/uL   Hemoglobin 13.8 13.0 - 17.0 g/dL   HCT 40.8 39.0 - 52.0 %   MCV 85.5  78.0 - 100.0 fL   MCH 28.9 26.0 - 34.0 pg   MCHC 33.8 30.0 - 36.0 g/dL   RDW 13.2 11.5 - 15.5 %   Platelets 112 (L) 150 - 400 K/uL    Comment: SPECIMEN CHECKED FOR CLOTS PLATELET COUNT CONFIRMED BY SMEAR    Neutrophils Relative % 52 43 - 77 %   Neutro Abs 2.9 1.7 - 7.7 K/uL   Lymphocytes Relative 32 12 - 46 %   Lymphs Abs 1.8 0.7 - 4.0 K/uL   Monocytes Relative 15 (H) 3 - 12 %   Monocytes Absolute 0.9 0.1 - 1.0 K/uL   Eosinophils Relative 1 0 - 5 %   Eosinophils Absolute 0.0 0.0 - 0.7 K/uL   Basophils Relative 0 0 - 1 %   Basophils Absolute 0.0 0.0 - 0.1 K/uL  Magnesium     Status: Abnormal   Collection Time: 01/16/14  4:35 PM  Result Value Ref Range   Magnesium 1.3 (L) 1.5 - 2.5 mg/dL  Phosphorus     Status: None   Collection Time: 01/16/14  4:35 PM  Result Value Ref Range   Phosphorus 4.4 2.3 - 4.6 mg/dL  Urinalysis, Routine w reflex microscopic     Status: Abnormal   Collection Time: 01/16/14  5:26 PM  Result Value Ref Range   Color, Urine YELLOW YELLOW   APPearance CLEAR CLEAR   Specific Gravity, Urine 1.010 1.005 - 1.030   pH 5.0 5.0 - 8.0   Glucose, UA NEGATIVE NEGATIVE mg/dL   Hgb urine dipstick SMALL (A) NEGATIVE   Bilirubin Urine NEGATIVE NEGATIVE   Ketones, ur NEGATIVE NEGATIVE mg/dL   Protein, ur 30 (A) NEGATIVE mg/dL   Urobilinogen, UA 0.2 0.0 - 1.0 mg/dL   Nitrite NEGATIVE NEGATIVE   Leukocytes, UA NEGATIVE NEGATIVE  Urine microscopic-add on     Status: Abnormal   Collection Time: 01/16/14  5:26 PM  Result Value Ref Range   Squamous Epithelial / LPF RARE RARE   RBC / HPF 3-6 <3 RBC/hpf   Bacteria, UA FEW (A) RARE  CBC     Status: None   Collection Time: 01/30/14 12:39 PM  Result Value Ref Range   WBC 6.2 4.0 - 10.5 K/uL   RBC 5.21 4.22 - 5.81 Mil/uL   Platelets 177.0 150.0 - 400.0 K/uL   Hemoglobin 14.9 13.0 - 17.0 g/dL   HCT 45.7 39.0 - 52.0 %   MCV 87.8 78.0 - 100.0 fl   MCHC 32.7 30.0 - 36.0 g/dL   RDW 13.1 11.5 - 49.6 %  Basic Metabolic Panel  (BMET)     Status: Abnormal   Collection Time: 01/30/14 12:39 PM  Result Value Ref Range   Sodium 139 135 - 145 mEq/L   Potassium 4.2 3.5 - 5.1 mEq/L   Chloride 106 96 - 112 mEq/L   CO2 23 19 - 32 mEq/L   Glucose, Bld 117 (H) 70 - 99 mg/dL   BUN 21 6 - 23 mg/dL   Creatinine, Ser 1.3 0.4 - 1.5 mg/dL   Calcium 9.8 8.4 - 10.5 mg/dL   GFR 70.19 >60.00 mL/min   Assessment/Plan: Diabetes mellitus type II, uncontrolled We'll increase Amaryl dose to 2 mg daily. Lisinopril restarted for blood pressure in lieu of amlodipine. Encourage patient to continue checking fasting blood sugar each morning. Right these numbers down. We'll follow up via phone in 2 weeks. Follow up in clinic in 2 months. Patient encouraged to use his gabapentin for nerve pain.

## 2014-04-04 NOTE — Telephone Encounter (Signed)
New rx sent to the pharmacy by e-script.//AB/CMA

## 2014-04-04 NOTE — Patient Instructions (Addendum)
Please start taking the new dose of Amaryl (2mg ) daily.  Continue checking your fasting blood sugar daily.  Remember that our goal is 80-120.  Please call me in couple of weeks with your blood sugar levels.  Please restart the lisinopril for BP.  Take the Gabapentin as directed for nerve pain.  Again call me in 2 weeks to let me know how you are doing.  Diabetic Neuropathy Diabetic neuropathy is a nerve disease or nerve damage that is caused by diabetes mellitus. About half of all people with diabetes mellitus have some form of nerve damage. Nerve damage is more common in those who have had diabetes mellitus for many years and who generally have not had good control of their blood sugar (glucose) level. Diabetic neuropathy is a common complication of diabetes mellitus. There are three more common types of diabetic neuropathy and a fourth type that is less common and less understood:   Peripheral neuropathy--This is the most common type of diabetic neuropathy. It causes damage to the nerves of the feet and legs first and then eventually the hands and arms.The damage affects the ability to sense touch.  Autonomic neuropathy--This type causes damage to the autonomic nervous system, which controls the following functions:  Heartbeat.  Body temperature.  Blood pressure.  Urination.  Digestion.  Sweating.  Sexual function.  Focal neuropathy--Focal neuropathy can be painful and unpredictable and occurs most often in older adults with diabetes mellitus. It involves a specific nerve or one area and often comes on suddenly. It usually does not cause long-term problems.  Radiculoplexus neuropathy-- Sometimes called lumbosacral radiculoplexus neuropathy, radiculoplexus neuropathy affects the nerves of the thighs, hips, buttocks, or legs. It is more common in people with type 2 diabetes mellitus and in older men. It is characterized by debilitating pain, weakness, and atrophy, usually in the thigh  muscles. CAUSES  The cause of peripheral, autonomic, and focal neuropathies is diabetes mellitus that is uncontrolled and high glucose levels. The cause of radiculoplexus neuropathy is unknown. However, it is thought to be caused by inflammation related to uncontrolled glucose levels. SIGNS AND SYMPTOMS  Peripheral Neuropathy Peripheral neuropathy develops slowly over time. When the nerves of the feet and legs no longer work there may be:   Burning, stabbing, or aching pain in the legs or feet.  Inability to feel pressure or pain in your feet. This can lead to:  Thick calluses over pressure areas.  Pressure sores.  Ulcers.  Foot deformities.  Reduced ability to feel temperature changes.  Muscle weakness. Autonomic Neuropathy The symptoms of autonomic neuropathy vary depending on which nerves are affected. Symptoms may include:  Problems with digestion, such as:  Feeling sick to your stomach (nausea).  Vomiting.  Bloating.  Constipation.  Diarrhea.  Abdominal pain.  Difficulty with urination. This occurs if you lose your ability to sense when your bladder is full. Problems include:  Urine leakage (incontinence).  Inability to empty your bladder completely (retention).  Rapid or irregular heartbeat (palpitations).  Blood pressure drops when you stand up (orthostatic hypotension). When you stand up you may feel:  Dizzy.  Weak.  Faint.  In men, inability to attain and maintain an erection.  In women, vaginal dryness and problems with decreased sexual desire and arousal.  Problems with body temperature regulation.  Increased or decreased sweating. Focal Neuropathy  Abnormal eye movements or abnormal alignment of both eyes.  Weakness in the wrist.  Foot drop. This results in an inability to lift the  foot properly and abnormal walking or foot movement.  Paralysis on one side of your face (Bell palsy).  Chest or abdominal pain. Radiculoplexus  Neuropathy  Sudden, severe pain in your hip, thigh, or buttocks.  Weakness and wasting of thigh muscles.  Difficulty rising from a seated position.  Abdominal swelling.  Unexplained weight loss (usually more than 10 lb [4.5 kg]). DIAGNOSIS  Peripheral Neuropathy Your senses may be tested. Sensory function testing can be done with:  A light touch using a monofilament.  A vibration with tuning fork.  A sharp sensation with a pin prick. Other tests that can help diagnose neuropathy are:  Nerve conduction velocity. This test checks the transmission of an electrical current through a nerve.  Electromyography. This shows how muscles respond to electrical signals transmitted by nearby nerves.  Quantitative sensory testing. This is used to assess how your nerves respond to vibrations and changes in temperature. Autonomic Neuropathy Diagnosis is often based on reported symptoms. Tell your health care provider if you experience:   Dizziness.   Constipation.   Diarrhea.   Inappropriate urination or inability to urinate.   Inability to get or maintain an erection.  Tests that may be done include:   Electrocardiography or Holter monitor. These are tests that can help show problems with the heart rate or heart rhythm.   An X-ray exam may be done. Focal Neuropathy Diagnosis is made based on your symptoms and what your health care provider finds during your exam. Other tests may be done. They may include:  Nerve conduction velocities. This checks the transmission of electrical current through a nerve.  Electromyography. This shows how muscles respond to electrical signals transmitted by nearby nerves.  Quantitative sensory testing. This test is used to assess how your nerves respond to vibration and changes in temperature. Radiculoplexus Neuropathy  Often the first thing is to eliminate any other issue or problems that might be the cause, as there is no stick test for  diagnosis.  X-ray exam of your spine and lumbar region.  Spinal tap to rule out cancer.  MRI to rule out other lesions. TREATMENT  Once nerve damage occurs, it cannot be reversed. The goal of treatment is to keep the disease or nerve damage from getting worse and affecting more nerve fibers. Controlling your blood glucose level is the key. Most people with radiculoplexus neuropathy see at least a partial improvement over time. You will need to keep your blood glucose and HbA1c levels in the target range determined by your health care provider. Things that help control blood glucose levels include:   Blood glucose monitoring.   Meal planning.   Physical activity.   Diabetes medicine.  Over time, maintaining lower blood glucose levels helps lessen symptoms. Sometimes, prescription pain medicine is needed. HOME CARE INSTRUCTIONS:  Do not smoke.  Keep your blood glucose level in the range that you and your health care provider have determined acceptable for you.  Keep your blood pressure level in the range that you and your health care provider have determined acceptable for you.  Eat a well-balanced diet.  Be active every day.  Check your feet every day. SEEK MEDICAL CARE IF:   You have burning, stabbing, or aching pain in the legs or feet.  You are unable to feel pressure or pain in your feet.  You develop problems with digestion such as:  Nausea.  Vomiting.  Bloating.  Constipation.  Diarrhea.  Abdominal pain.  You have difficulty with urination,  such as:  Incontinence.  Retention.  You have palpitations.  You develop orthostatic hypotension. When you stand up you may feel:  Dizzy.  Weak.  Faint.  You cannot attain and maintain an erection (in men).  You have vaginal dryness and problems with decreased sexual desire and arousal (in women).  You have severe pain in your thighs, legs, or buttocks.  You have unexplained weight loss. Document  Released: 03/24/2001 Document Revised: 11/03/2012 Document Reviewed: 06/24/2012 [facility] Patient Information 2015 [place], [place]. This information is not intended to replace advice given to you by your health care provider. Make sure you discuss any questions you have with your health care provide

## 2014-04-11 NOTE — Assessment & Plan Note (Signed)
We'll increase Amaryl dose to 2 mg daily. Lisinopril restarted for blood pressure in lieu of amlodipine. Encourage patient to continue checking fasting blood sugar each morning. Right these numbers down. We'll follow up via phone in 2 weeks. Follow up in clinic in 2 months. Patient encouraged to use his gabapentin for nerve pain.

## 2014-04-17 ENCOUNTER — Encounter: Payer: Self-pay | Admitting: Physician Assistant

## 2014-04-17 ENCOUNTER — Ambulatory Visit (INDEPENDENT_AMBULATORY_CARE_PROVIDER_SITE_OTHER): Payer: Managed Care, Other (non HMO) | Admitting: Physician Assistant

## 2014-04-17 VITALS — BP 133/90 | HR 106 | Temp 98.1°F | Resp 81 | Ht 69.25 in | Wt 250.0 lb

## 2014-04-17 DIAGNOSIS — B9789 Other viral agents as the cause of diseases classified elsewhere: Principal | ICD-10-CM

## 2014-04-17 DIAGNOSIS — G4700 Insomnia, unspecified: Secondary | ICD-10-CM

## 2014-04-17 DIAGNOSIS — J069 Acute upper respiratory infection, unspecified: Secondary | ICD-10-CM

## 2014-04-17 MED ORDER — ZOLPIDEM TARTRATE 5 MG PO TABS: 5.0000 mg | ORAL_TABLET | Freq: Every evening | ORAL | Status: DC | PRN

## 2014-04-17 MED ORDER — FLUTICASONE PROPIONATE 50 MCG/ACT NA SUSP: 2.0000 | Freq: Every day | NASAL | Status: DC

## 2014-04-17 MED ORDER — BENZONATATE 100 MG PO CAPS: 100.0000 mg | ORAL_CAPSULE | Freq: Two times a day (BID) | ORAL | Status: DC | PRN

## 2014-04-17 NOTE — Assessment & Plan Note (Signed)
Stemming from grief reaction. Discussed normal stages of grief. We'll initiate 5 mg Ambien at bedtime short-term to help promote restful sleep. Potential side effects of medication discussed with patient. Follow-up when necessary.

## 2014-04-17 NOTE — Patient Instructions (Signed)
For the cough, please take the Tessalon as directed and use the Flonase as directed. Stay well hydrated.  You can use some plain Mucinex for congestion.  Continue other medications as directed. Start Ambien nightly to help with sleep.  Follow-up with me if no improvement.

## 2014-04-17 NOTE — Assessment & Plan Note (Signed)
Resolving. Supportive measures discussed with patient. Rx Tessalon to use as directed cough. Mucinex as needed for lingering chest congestion. Increase fluids. Rest. Follow-up when necessary.

## 2014-04-17 NOTE — Progress Notes (Signed)
Patient presents to clinic today c/o dry cough over the past few days.  Endorses some mild URI symptoms, including  that have resolved. Denies fever, chills, malaise or fatigue. Denies chest pain or shortness of breath.  Patient dealing with the loss of his brother who passed away this past weekend.  Is having extreme difficulty getting to sleep and staying asleep.  Denies depressed mood, SI/HI.  Past Medical History  Diagnosis Date  . Hypertension   . Diabetes mellitus without complication   . Chicken pox   . Hepatitis C     Current Outpatient Prescriptions on File Prior to Visit  Medication Sig Dispense Refill  . gabapentin (NEURONTIN) 100 MG capsule Take 1 tablet by mouth for 3 days.  Then increase to 1 tablet twice daily. 90 capsule 0  . glimepiride (AMARYL) 2 MG tablet Take 1 tablet (2 mg total) by mouth daily before breakfast. 30 tablet 3  . ibuprofen (ADVIL,MOTRIN) 200 MG tablet Take 200 mg by mouth every 6 (six) hours as needed.    Marland Kitchen lisinopril (PRINIVIL,ZESTRIL) 10 MG tablet Take 1 tablet (10 mg total) by mouth daily. 30 tablet 3  . loratadine (CLARITIN) 10 MG tablet Take 10 mg by mouth daily as needed for allergies.    . tamsulosin (FLOMAX) 0.4 MG CAPS capsule Take 1 tablet by mouth at bedtime. 30 capsule 0   No current facility-administered medications on file prior to visit.    No Known Allergies  Family History  Problem Relation Age of Onset  . Heart attack Father     Deceased  . Heart disease Father   . Diabetes Mother     Living  . Heart attack Brother 80    Deceased  . Sudden death Brother   . Thyroid disease Sister   . Diabetes Sister   . Healthy Sister     x1  . Colon cancer Brother     x1  . Heart disease Maternal Grandfather   . Allergies Daughter   . Diabetes Son   . Diabetes Maternal Grandmother     History   Social History  . Marital Status: Divorced    Spouse Name: N/A  . Number of Children: N/A  . Years of Education: N/A   Social  History Main Topics  . Smoking status: Current Some Day Smoker -- 0.25 packs/day    Types: Cigarettes  . Smokeless tobacco: Never Used  . Alcohol Use: Yes  . Drug Use: No  . Sexual Activity: Not on file   Other Topics Concern  . None   Social History Narrative    Review of Systems - See HPI.  All other ROS are negative.  BP 133/90 mmHg  Pulse 106  Temp(Src) 98.1 F (36.7 C) (Oral)  Resp 81  Ht 5' 9.25" (1.759 m)  Wt 250 lb (113.399 kg)  BMI 36.65 kg/m2  SpO2 98%  Physical Exam  Constitutional: He is oriented to person, place, and time and well-developed, well-nourished, and in no distress.  HENT:  Head: Normocephalic and atraumatic.  Right Ear: External ear normal.  Left Ear: External ear normal.  Nose: Nose normal.  Mouth/Throat: Oropharynx is clear and moist. No oropharyngeal exudate.  TM within normal limits bilaterally.  Eyes: Conjunctivae are normal. Pupils are equal, round, and reactive to light.  Neck: Neck supple.  Cardiovascular: Normal rate, regular rhythm, normal heart sounds and intact distal pulses.   Pulmonary/Chest: Effort normal and breath sounds normal. No respiratory distress. He has  no wheezes. He has no rales. He exhibits no tenderness.  Neurological: He is alert and oriented to person, place, and time.  Skin: Skin is warm and dry. No rash noted.  Psychiatric: Affect normal.  Vitals reviewed.   Recent Results (from the past 2160 hour(s))  CBC     Status: None   Collection Time: 01/30/14 12:39 PM  Result Value Ref Range   WBC 6.2 4.0 - 10.5 K/uL   RBC 5.21 4.22 - 5.81 Mil/uL   Platelets 177.0 150.0 - 400.0 K/uL   Hemoglobin 14.9 13.0 - 17.0 g/dL   HCT 45.7 39.0 - 52.0 %   MCV 87.8 78.0 - 100.0 fl   MCHC 32.7 30.0 - 36.0 g/dL   RDW 13.1 11.5 - 29.9 %  Basic Metabolic Panel (BMET)     Status: Abnormal   Collection Time: 01/30/14 12:39 PM  Result Value Ref Range   Sodium 139 135 - 145 mEq/L   Potassium 4.2 3.5 - 5.1 mEq/L   Chloride 106 96  - 112 mEq/L   CO2 23 19 - 32 mEq/L   Glucose, Bld 117 (H) 70 - 99 mg/dL   BUN 21 6 - 23 mg/dL   Creatinine, Ser 1.3 0.4 - 1.5 mg/dL   Calcium 9.8 8.4 - 10.5 mg/dL   GFR 70.19 >60.00 mL/min    Assessment/Plan: Viral URI with cough Resolving. Supportive measures discussed with patient. Rx Tessalon to use as directed cough. Mucinex as needed for lingering chest congestion. Increase fluids. Rest. Follow-up when necessary.   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 Discussed normal stages of grief. We'll initiate 5 mg Ambien at bedtime short-term to help promote restful sleep. Potential side effects of medication discussed with patient. Follow-up when necessary.

## 2014-04-17 NOTE — Progress Notes (Signed)
Pre visit review using our clinic review tool, if applicable. No additional management support is needed unless otherwise documented below in the visit note/SLS  

## 2014-04-25 ENCOUNTER — Other Ambulatory Visit: Payer: Self-pay | Admitting: Physician Assistant

## 2014-04-26 NOTE — Telephone Encounter (Signed)
Medication Detail      Disp Refills Start End     tamsulosin (FLOMAX) 0.4 MG CAPS capsule 30 capsule 0 04/04/2014     Sig: Take 1 tablet by mouth at bedtime.    E-Prescribing Status: Receipt confirmed by pharmacy (04/04/2014 4:47 PM EST)    HOLD Rx until due/SLS

## 2014-05-30 ENCOUNTER — Other Ambulatory Visit: Payer: Self-pay | Admitting: Physician Assistant

## 2014-06-06 ENCOUNTER — Other Ambulatory Visit: Payer: Self-pay | Admitting: Physician Assistant

## 2014-07-05 ENCOUNTER — Ambulatory Visit: Payer: Self-pay | Admitting: Physician Assistant

## 2014-07-05 DIAGNOSIS — Z0289 Encounter for other administrative examinations: Secondary | ICD-10-CM

## 2014-08-03 ENCOUNTER — Encounter: Payer: Self-pay | Admitting: Medical

## 2014-08-03 ENCOUNTER — Ambulatory Visit (INDEPENDENT_AMBULATORY_CARE_PROVIDER_SITE_OTHER): Payer: Managed Care, Other (non HMO) | Admitting: Medical

## 2014-08-03 VITALS — BP 151/82 | HR 97 | Temp 98.9°F | Ht 69.5 in | Wt 244.0 lb

## 2014-08-03 DIAGNOSIS — IMO0002 Reserved for concepts with insufficient information to code with codable children: Secondary | ICD-10-CM

## 2014-08-03 DIAGNOSIS — E1149 Type 2 diabetes mellitus with other diabetic neurological complication: Secondary | ICD-10-CM | POA: Diagnosis not present

## 2014-08-03 DIAGNOSIS — E1165 Type 2 diabetes mellitus with hyperglycemia: Secondary | ICD-10-CM | POA: Diagnosis not present

## 2014-08-03 DIAGNOSIS — E1140 Type 2 diabetes mellitus with diabetic neuropathy, unspecified: Secondary | ICD-10-CM | POA: Insufficient documentation

## 2014-08-03 LAB — COMPREHENSIVE METABOLIC PANEL
ALBUMIN: 4 g/dL (ref 3.5–5.2)
ALT: 137 U/L — ABNORMAL HIGH (ref 0–53)
AST: 121 U/L — ABNORMAL HIGH (ref 0–37)
Alkaline Phosphatase: 70 U/L (ref 39–117)
BUN: 12 mg/dL (ref 6–23)
CO2: 25 meq/L (ref 19–32)
CREATININE: 1 mg/dL (ref 0.40–1.50)
Calcium: 9.7 mg/dL (ref 8.4–10.5)
Chloride: 101 mEq/L (ref 96–112)
GFR: 98.22 mL/min (ref >60.00)
Glucose, Bld: 251 mg/dL — ABNORMAL HIGH (ref 70–99)
POTASSIUM: 4.2 meq/L (ref 3.5–5.1)
Sodium: 135 mEq/L (ref 135–145)
Total Bilirubin: 0.6 mg/dL (ref 0.2–1.2)
Total Protein: 7.4 g/dL (ref 6.0–8.3)

## 2014-08-03 LAB — HEMOGLOBIN A1C: HEMOGLOBIN A1C: 10 % — AB (ref 4.6–6.5)

## 2014-08-03 MED ORDER — PREGABALIN 75 MG PO CAPS: 75.0000 mg | ORAL_CAPSULE | Freq: Two times a day (BID) | ORAL | Status: DC

## 2014-08-03 NOTE — Assessment & Plan Note (Signed)
Rx lyrica. Pt will stop neurontin. If lyrica not covered can resume neurontin but at higher dose.

## 2014-08-03 NOTE — Progress Notes (Signed)
Subjective:    Patient ID: Casey Reyes, male    DOB: July 14, 1955, 59 y.o.   MRN: [id number]  HPI  Pt in stating he gets sharp bottom of feet nerve pain at times. Also slight numb sensation at times. Pt states going on for a couple of months. Pt is diabetic. Pt was on neurontin. Pt has been using neurontin in the past. He states ran out of neurontin about one month ago. But he was having bothersome symptoms even before he ran out out of neurontin. Then symptoms got worse when he ran out. He states he wants to try lyrica.  Pt last a1-c was 7 month ago and was 10.5 Pt had some lft elevation 7 month. Pt diabetic for 7-8 yrs per his report.      Review of Systems  Constitutional: Negative for fever, chills, diaphoresis, activity change and fatigue.  Respiratory: Negative for cough, chest tightness and shortness of breath.   Cardiovascular: Negative for chest pain, palpitations and leg swelling.  Gastrointestinal: Negative for nausea, vomiting and abdominal pain.  Musculoskeletal: Negative for neck pain and neck stiffness.       Feet pain.  Neurological: Negative for dizziness, tremors, seizures, syncope, facial asymmetry, speech difficulty, weakness, light-headedness, numbness and headaches.  Psychiatric/Behavioral: Negative for behavioral problems, confusion and agitation. The patient is not nervous/anxious.    Past Medical History  Diagnosis Date  . Hypertension   . Diabetes mellitus without complication   . Chicken pox   . Hepatitis C     History   Social History  . Marital Status: Divorced    Spouse Name: N/A  . Number of Children: N/A  . Years of Education: N/A   Occupational History  . Not on file.   Social History Main Topics  . Smoking status: Current Some Day Smoker -- 0.25 packs/day    Types: Cigarettes  . Smokeless tobacco: Never Used  . Alcohol Use: Yes  . Drug Use: No  . Sexual Activity: Not on file   Other Topics Concern  . Not on file   Social History  Narrative    Past Surgical History  Procedure Laterality Date  . Finger surgery      Right-Tendon Cut  . Wisdom tooth extraction      Family History  Problem Relation Age of Onset  . Heart attack Father     Deceased  . Heart disease Father   . Diabetes Mother     Living  . Heart attack Brother 40    Deceased  . Sudden death Brother   . Thyroid disease Sister   . Diabetes Sister   . Healthy Sister     x1  . Colon cancer Brother     x1  . Heart disease Maternal Grandfather   . Allergies Daughter   . Diabetes Son   . Diabetes Maternal Grandmother     No Known Allergies  Current Outpatient Prescriptions on File Prior to Visit  Medication Sig Dispense Refill  . gabapentin (NEURONTIN) 100 MG capsule TAKE 1 CAPSULE BY MOUTH DAILY FOR 3 DAYS, THEN INCREASE 1 CAPSULE BY MOUTH TWICE DAILY 90 capsule 0  . glimepiride (AMARYL) 2 MG tablet Take 1 tablet (2 mg total) by mouth daily before breakfast. 30 tablet 3  . tamsulosin (FLOMAX) 0.4 MG CAPS capsule TAKE 1 CAPSULE BY MOUTH AT BEDTIME 30 capsule 0  . benzonatate (TESSALON) 100 MG capsule TAKE 1 CAPSULE(100 MG) BY MOUTH TWICE DAILY AS NEEDED FOR COUGH (Patient  not taking: Reported on 08/03/2014) 20 capsule 0  . fluticasone (FLONASE) 50 MCG/ACT nasal spray Place 2 sprays into both nostrils daily. (Patient not taking: Reported on 08/03/2014) 16 g 6  . ibuprofen (ADVIL,MOTRIN) 200 MG tablet Take 200 mg by mouth every 6 (six) hours as needed.    Marland Kitchen lisinopril (PRINIVIL,ZESTRIL) 10 MG tablet Take 1 tablet (10 mg total) by mouth daily. (Patient not taking: Reported on 08/03/2014) 30 tablet 3  . loratadine (CLARITIN) 10 MG tablet Take 10 mg by mouth daily as needed for allergies.    Marland Kitchen zolpidem (AMBIEN) 5 MG tablet Take 1 tablet (5 mg total) by mouth at bedtime as needed for sleep. (Patient not taking: Reported on 08/03/2014) 15 tablet 1   No current facility-administered medications on file prior to visit.    BP 151/82 mmHg  Pulse 97   Temp(Src) 98.9 F (37.2 C) (Oral)  Ht 5' 9.5" (1.765 m)  Wt 244 lb (110.678 kg)  BMI 35.53 kg/m2  SpO2 96%       Objective:   Physical Exam  General- No acute distress. Pleasant patient. Neck- Full range of motion, no jvd Lungs- Clear, even and unlabored. Heart- regular rate and rhythm. Neurologic- CNII- XII grossly intact.  Lower ext/feet- no breakdown, no ulceration. Sensation intact. Pulses intact.     Assessment & Plan:

## 2014-08-03 NOTE — Progress Notes (Signed)
Pre visit review using our clinic review tool, if applicable. No additional management support is needed unless otherwise documented below in the visit note. 

## 2014-08-03 NOTE — Patient Instructions (Addendum)
Diabetic neuropathy Rx lyrica. Pt will stop neurontin. If lyrica not covered can resume neurontin but at higher dose.   Diabetes mellitus type II, uncontrolled Will recheck cmp and a1c    Follow up 2-3 weeks or as needed.  Will call you on labs when results are in.  Will check her bp on follow up. Make adjustment if bp still on high side.

## 2014-08-03 NOTE — Assessment & Plan Note (Signed)
Will recheck cmp and a1c

## 2014-08-08 ENCOUNTER — Encounter: Payer: Self-pay | Admitting: Physician Assistant

## 2014-08-08 ENCOUNTER — Ambulatory Visit (INDEPENDENT_AMBULATORY_CARE_PROVIDER_SITE_OTHER): Payer: Managed Care, Other (non HMO) | Admitting: Physician Assistant

## 2014-08-08 VITALS — BP 133/82 | HR 102 | Temp 98.1°F | Ht 69.25 in | Wt 245.4 lb

## 2014-08-08 DIAGNOSIS — G4700 Insomnia, unspecified: Secondary | ICD-10-CM

## 2014-08-08 DIAGNOSIS — IMO0002 Reserved for concepts with insufficient information to code with codable children: Secondary | ICD-10-CM

## 2014-08-08 DIAGNOSIS — E1165 Type 2 diabetes mellitus with hyperglycemia: Secondary | ICD-10-CM

## 2014-08-08 DIAGNOSIS — B1920 Unspecified viral hepatitis C without hepatic coma: Secondary | ICD-10-CM | POA: Diagnosis not present

## 2014-08-08 MED ORDER — LISINOPRIL 10 MG PO TABS: 10.0000 mg | ORAL_TABLET | Freq: Every day | ORAL | Status: DC

## 2014-08-08 MED ORDER — ZOLPIDEM TARTRATE 5 MG PO TABS: 5.0000 mg | ORAL_TABLET | Freq: Every evening | ORAL | Status: DC | PRN

## 2014-08-08 MED ORDER — CANAGLIFLOZIN 100 MG PO TABS: 100.0000 mg | ORAL_TABLET | Freq: Every day | ORAL | Status: DC

## 2014-08-08 MED ORDER — GLIMEPIRIDE 2 MG PO TABS: 2.0000 mg | ORAL_TABLET | Freq: Every day | ORAL | Status: DC

## 2014-08-08 NOTE — Progress Notes (Signed)
Pre visit review using our clinic review tool, if applicable. No additional management support is needed unless otherwise documented below in the visit note. 

## 2014-08-08 NOTE — Progress Notes (Signed)
Patient presents to clinic today for follow-up of diabetes, previously uncontrolled. Patient has previously been noncompliant with medications and follow-up. Was seen last week by another provider in office for complaints of diabetic neuropathy. Patient switched from gabapentin to lyrica which he just started taking last night. Has not noted difference in symptoms yet. A1C at 10.0. Patient previously on Metformin, Januvia and Amaryl but had to be taken off of medications due to renal function. Was taken off all but Amaryl with plan to resume medications at low dose to see which medications could be tolerated. Patient endorses taking Amaryl as directed. Endorses sugars high but cannon give specifics. Has only been checking sugars once a week.   Of note patient also with hsitory of elevated liver enzymes found to be related to Hepatitis C and alcohol use. Has not followed up with GI as recommended. Is still drinking liquor against medical advice.  Past Medical History  Diagnosis Date  . Hypertension   . Diabetes mellitus without complication   . Chicken pox   . Hepatitis C     Current Outpatient Prescriptions on File Prior to Visit  Medication Sig Dispense Refill  . pregabalin (LYRICA) 75 MG capsule Take 1 capsule (75 mg total) by mouth 2 (two) times daily. 60 capsule 0  . tamsulosin (FLOMAX) 0.4 MG CAPS capsule TAKE 1 CAPSULE BY MOUTH AT BEDTIME 30 capsule 0  . ibuprofen (ADVIL,MOTRIN) 200 MG tablet Take 200 mg by mouth every 6 (six) hours as needed.    . loratadine (CLARITIN) 10 MG tablet Take 10 mg by mouth daily as needed for allergies.     No current facility-administered medications on file prior to visit.    No Known Allergies  Family History  Problem Relation Age of Onset  . Heart attack Father     Deceased  . Heart disease Father   . Diabetes Mother     Living  . Heart attack Brother 22    Deceased  . Sudden death Brother   . Thyroid disease Sister   . Diabetes Sister     . Healthy Sister     x1  . Colon cancer Brother     x1  . Heart disease Maternal Grandfather   . Allergies Daughter   . Diabetes Son   . Diabetes Maternal Grandmother     History   Social History  . Marital Status: Divorced    Spouse Name: N/A  . Number of Children: N/A  . Years of Education: N/A   Social History Main Topics  . Smoking status: Current Some Day Smoker -- 0.25 packs/day    Types: Cigarettes  . Smokeless tobacco: Never Used  . Alcohol Use: Yes  . Drug Use: No  . Sexual Activity: Not on file   Other Topics Concern  . None   Social History Narrative   Review of Systems - See HPI.  All other ROS are negative.  BP 133/82 mmHg  Pulse 102  Temp(Src) 98.1 F (36.7 C) (Oral)  Ht 5' 9.25" (1.759 m)  Wt 245 lb 6.4 oz (111.313 kg)  BMI 35.98 kg/m2  SpO2 99%  Physical Exam  Constitutional: He is oriented to person, place, and time and well-developed, well-nourished, and in no distress.  HENT:  Head: Normocephalic and atraumatic.  Eyes: Conjunctivae are normal.  Cardiovascular: Normal rate, regular rhythm, normal heart sounds and intact distal pulses.   Pulmonary/Chest: Effort normal and breath sounds normal. No respiratory distress. He has no wheezes.  He has no rales. He exhibits no tenderness.  Neurological: He is alert and oriented to person, place, and time.  Skin: Skin is warm and dry. No rash noted.  Psychiatric: Affect normal.  Vitals reviewed.   Recent Results (from the past 2160 hour(s))  Comprehensive metabolic panel     Status: Abnormal   Collection Time: 08/03/14  2:07 PM  Result Value Ref Range   Sodium 135 135 - 145 mEq/L   Potassium 4.2 3.5 - 5.1 mEq/L   Chloride 101 96 - 112 mEq/L   CO2 25 19 - 32 mEq/L   Glucose, Bld 251 (H) 70 - 99 mg/dL   BUN 12 6 - 23 mg/dL   Creatinine, Ser 1.00 0.40 - 1.50 mg/dL   Total Bilirubin 0.6 0.2 - 1.2 mg/dL   Alkaline Phosphatase 70 39 - 117 U/L   AST 121 (H) 0 - 37 U/L   ALT 137 (H) 0 - 53 U/L    Total Protein 7.4 6.0 - 8.3 g/dL   Albumin 4.0 3.5 - 5.2 g/dL   Calcium 9.7 8.4 - 10.5 mg/dL   GFR 98.22 >60.00 mL/min  Hemoglobin A1c     Status: Abnormal   Collection Time: 08/03/14  2:07 PM  Result Value Ref Range   Hgb A1c MFr Bld 10.0 (H) 4.6 - 6.5 %    Comment: Glycemic Control Guidelines for People with Diabetes:Non Diabetic:  <6%Goal of Therapy: <7%Additional Action Suggested:  >8%    Assessment/Plan: Diabetes mellitus type II, uncontrolled Discussed likely worsening of DM if he continue to be noncompliant with follow-up, medications and glucose monitoring. Will continue Amaryl for now but will attempt to remove in near future to prevent burnout. Will begin Invokana 100 mg daily. Will attempt to titrate to 300 mg daily once patient is tolerating starting dose. Check glucose fasting daily and record. Resume lisinopril and continue Lyrica as directed. Stop alcohol consumption.  Follow-up 3 weeks. Again will attempt to increase Invokana to 300 mg. Next step will be to possibly add on Bydureon.   Hepatitis, viral Patient non-compliant with follow-up. Still drinking AMA. Stop alcohol consumption. Patient promises to schedule follow-up with Hepatologist. Will recheck this at follow-up in 3 weeks.

## 2014-08-08 NOTE — Assessment & Plan Note (Signed)
Discussed likely worsening of DM if he continue to be noncompliant with follow-up, medications and glucose monitoring. Will continue Amaryl for now but will attempt to remove in near future to prevent burnout. Will begin Invokana 100 mg daily. Will attempt to titrate to 300 mg daily once patient is tolerating starting dose. Check glucose fasting daily and record. Resume lisinopril and continue Lyrica as directed. Stop alcohol consumption.  Follow-up 3 weeks. Again will attempt to increase Invokana to 300 mg. Next step will be to possibly add on Bydureon.

## 2014-08-08 NOTE — Patient Instructions (Addendum)
Please start the Invokana daily. Continue your Amaryl as directed. Restart Lisinopril for blood pressure.  Check blood sugar each morning before breakfast. Write down these numbers and bring to visit.  Stay active. Exercise will lower sugar levels. Continue Lyrica as directed for nerve pain.  Call your GI specialist to schedule follow-up regarding Hepatitis C and liver dysfunction. Stop all alcohol consumption. You are only increasing your risk for liver cancer. Limit tylenol containing products.  Follow-up in 3 weeks.

## 2014-08-08 NOTE — Assessment & Plan Note (Signed)
Patient non-compliant with follow-up. Still drinking AMA. Stop alcohol consumption. Patient promises to schedule follow-up with Hepatologist. Will recheck this at follow-up in 3 weeks.

## 2014-08-25 ENCOUNTER — Telehealth: Payer: Self-pay | Admitting: Family Medicine

## 2014-08-25 ENCOUNTER — Telehealth: Payer: Self-pay | Admitting: Physician Assistant

## 2014-08-25 ENCOUNTER — Ambulatory Visit: Payer: Managed Care, Other (non HMO) | Admitting: Physician Assistant

## 2014-08-25 MED ORDER — GLUCOSE BLOOD VI STRP: ORAL_STRIP | Status: DC

## 2014-08-25 NOTE — Telephone Encounter (Signed)
Please advise.//AB/CMA 

## 2014-08-25 NOTE — Telephone Encounter (Signed)
On Touch Verio Flex test strips sent to the pharmacy by e-script.//AB/CMA

## 2014-08-25 NOTE — Addendum Note (Signed)
Addended by: Harl Bowie on: 08/25/2014 04:56 PM   Modules accepted: Orders

## 2014-08-25 NOTE — Telephone Encounter (Signed)
Relation to pt: self  Call back number: [phone] or [phone]   Reason for call:  Patient overslept this morning for 7am appointment, patient said he is sorry and he is completely out of test strips. Patient would like to [facility] for a later time today. Please advise if patient can come in today to see another physican or can wait to next available with PCP. Please advise

## 2014-08-25 NOTE — Telephone Encounter (Signed)
He can schedule with another provider. I would prefer he see me but I am booked and out of office next week and it is important that he be seen for follow-up as diabetes in uncontrolled.  Ok to send in refills of test strips.

## 2014-08-25 NOTE — Telephone Encounter (Signed)
Scheduled appt 08/28/14 with Dr. Etter Sjogren. Pt said he is using a one touch verio meter and needing test strips for that.

## 2014-08-25 NOTE — Telephone Encounter (Signed)
Called and [facility] @ 12:10pm @ [phone]) asking the pt to RTC regarding note below.//AB/CMA

## 2014-08-28 ENCOUNTER — Ambulatory Visit: Payer: Managed Care, Other (non HMO) | Admitting: Family Medicine

## 2014-08-30 NOTE — Telephone Encounter (Signed)
Pt was no show 08/25/14 for f/u uncontrolled diabetes, rescheduled for 08/28/14 with Dr. Etter Sjogren since Einar Pheasant was out, pt was also no show on 08/28/14. Pt is rescheduled for 09/05/14 4:30pm with Dr. Etter Sjogren.  Charge for 08/25/14 with Einar Pheasant? Charge for 08/28/14 with [place]?

## 2014-08-30 NOTE — Telephone Encounter (Signed)
Charge for both.

## 2014-08-30 NOTE — Telephone Encounter (Signed)
yes

## 2014-09-02 ENCOUNTER — Other Ambulatory Visit: Payer: Self-pay | Admitting: Medical

## 2014-09-05 ENCOUNTER — Ambulatory Visit (INDEPENDENT_AMBULATORY_CARE_PROVIDER_SITE_OTHER): Payer: Managed Care, Other (non HMO) | Admitting: Family Medicine

## 2014-09-05 ENCOUNTER — Encounter: Payer: Self-pay | Admitting: Family Medicine

## 2014-09-05 VITALS — BP 142/88 | HR 83 | Temp 98.1°F | Wt 249.2 lb

## 2014-09-05 DIAGNOSIS — IMO0002 Reserved for concepts with insufficient information to code with codable children: Secondary | ICD-10-CM

## 2014-09-05 DIAGNOSIS — E1165 Type 2 diabetes mellitus with hyperglycemia: Secondary | ICD-10-CM | POA: Diagnosis not present

## 2014-09-05 DIAGNOSIS — I10 Essential (primary) hypertension: Secondary | ICD-10-CM

## 2014-09-05 MED ORDER — GLUCOSE BLOOD VI STRP: ORAL_STRIP | Status: DC

## 2014-09-05 NOTE — Patient Instructions (Signed)

## 2014-09-05 NOTE — Progress Notes (Signed)
Pre visit review using our clinic review tool, if applicable. No additional management support is needed unless otherwise documented below in the visit note. 

## 2014-09-05 NOTE — Progress Notes (Signed)
Patient ID: Casey Reyes, male    DOB: [birth year]  Age: 59 y.o. MRN: [id number]    Subjective:  Subjective HPI Casey Reyes presents for f/u DM. He has been taking the invokanna.  And his sugars have come down nicely.  See home sugars--scanned in.     HYPERTENSION  Blood pressure range-not checking  Chest pain- no      Dyspnea- no Lightheadedness- no   Edema- no Other side effects - no   Medication compliance: good Low salt diet- no  DIABETES  Blood Sugar ranges-160-200  Polyuria- no New Visual problems- no Hypoglycemic symptoms- no Other side effects-no Medication compliance - good Last eye exam- due Foot exam- today  HYPERLIPIDEMIA  Medication compliance- good RUQ pain- no  Muscle aches- no Other side effects-no    Review of Systems  Constitutional: Negative for diaphoresis, appetite change, fatigue and unexpected weight change.  Eyes: Negative for pain, redness and visual disturbance.  Respiratory: Negative for cough, chest tightness, shortness of breath and wheezing.   Cardiovascular: Negative for chest pain, palpitations and leg swelling.  Endocrine: Negative for cold intolerance, heat intolerance, polydipsia, polyphagia and polyuria.  Genitourinary: Negative for dysuria, frequency and difficulty urinating.  Neurological: Negative for dizziness, light-headedness, numbness and headaches.    History Past Medical History  Diagnosis Date  . Hypertension   . Diabetes mellitus without complication   . Chicken pox   . Hepatitis C     He has past surgical history that includes Finger surgery and Wisdom tooth extraction.   His family history includes Allergies in his daughter; Colon cancer in his brother; Diabetes in his maternal grandmother, mother, sister, and son; Healthy in his sister; Heart attack in his father; Heart attack (age of onset: 10) in his brother; Heart disease in his father and maternal grandfather; Sudden death in his brother; Thyroid disease in  his sister.He reports that he has been smoking Cigarettes.  He has been smoking about 0.25 packs per day. He has never used smokeless tobacco. He reports that he drinks alcohol. He reports that he does not use illicit drugs.  Current Outpatient Prescriptions on File Prior to Visit  Medication Sig Dispense Refill  . canagliflozin (INVOKANA) 100 MG TABS tablet Take 1 tablet (100 mg total) by mouth daily. 30 tablet 1  . glimepiride (AMARYL) 2 MG tablet Take 1 tablet (2 mg total) by mouth daily before breakfast. 30 tablet 3  . ibuprofen (ADVIL,MOTRIN) 200 MG tablet Take 200 mg by mouth every 6 (six) hours as needed.    Marland Kitchen lisinopril (PRINIVIL,ZESTRIL) 10 MG tablet Take 1 tablet (10 mg total) by mouth daily. 30 tablet 3  . loratadine (CLARITIN) 10 MG tablet Take 10 mg by mouth daily as needed for allergies.    Marland Kitchen LYRICA 75 MG capsule TAKE ONE CAPSULE BY MOUTH TWICE DAILY 60 capsule 3  . tamsulosin (FLOMAX) 0.4 MG CAPS capsule TAKE 1 CAPSULE BY MOUTH AT BEDTIME 30 capsule 0  . zolpidem (AMBIEN) 5 MG tablet Take 1 tablet (5 mg total) by mouth at bedtime as needed for sleep. 15 tablet 3   No current facility-administered medications on file prior to visit.     Objective:  Objective Physical Exam  Constitutional: He is oriented to person, place, and time. Vital signs are normal. He appears well-developed and well-nourished. He is sleeping.  HENT:  Head: Normocephalic and atraumatic.  Mouth/Throat: Oropharynx is clear and moist.  Eyes: EOM are normal. Pupils are equal, round, and reactive to  light.  Neck: Normal range of motion. Neck supple. No thyromegaly present.  Cardiovascular: Normal rate and regular rhythm.   No murmur heard. Pulmonary/Chest: Effort normal and breath sounds normal. No respiratory distress. He has no wheezes. He has no rales. He exhibits no tenderness.  Musculoskeletal: He exhibits no edema or tenderness.  Neurological: He is alert and oriented to person, place, and time.    Skin: Skin is warm and dry.  Psychiatric: He has a normal mood and affect. His behavior is normal. Judgment and thought content normal.  Nursing note and vitals reviewed. Sensory exam of the foot is normal, tested with the monofilament. Good pulses, no lesions or ulcers, good peripheral pulses.   BP 142/88 mmHg  Pulse 83  Temp(Src) 98.1 F (36.7 C) (Oral)  Wt 249 lb 3.2 oz (113.036 kg)  SpO2 98% Wt Readings from Last 3 Encounters:  09/05/14 249 lb 3.2 oz (113.036 kg)  08/08/14 245 lb 6.4 oz (111.313 kg)  08/03/14 244 lb (110.678 kg)     Lab Results  Component Value Date   WBC 6.2 01/30/2014   HGB 14.9 01/30/2014   HCT 45.7 01/30/2014   PLT 177.0 01/30/2014   GLUCOSE 251* 08/03/2014   CHOL 160 07/18/2013   TRIG 147 07/18/2013   HDL 29* 07/18/2013   LDLCALC 102* 07/18/2013   ALT 137* 08/03/2014   AST 121* 08/03/2014   NA 135 08/03/2014   K 4.2 08/03/2014   CL 101 08/03/2014   CREATININE 1.00 08/03/2014   BUN 12 08/03/2014   CO2 25 08/03/2014   TSH 0.765 07/18/2013   PSA 0.91 07/18/2013   HGBA1C 10.0* 08/03/2014   MICROALBUR 3.06* 07/18/2013    Dg Chest 2 View  01/16/2014   CLINICAL DATA:  Initial evaluation for acute renal failure.  EXAM: CHEST  2 VIEW  COMPARISON:  None.  FINDINGS: The cardiac and mediastinal silhouettes are within normal limits.  The lungs are normally inflated. Minimal left perihilar atelectasis present. No airspace consolidation, pleural effusion, or pulmonary edema is identified. There is no pneumothorax.  No acute osseous abnormality identified.  IMPRESSION: 1. No active cardiopulmonary disease. 2. Minimal left perihilar atelectasis/scarring.   Electronically Signed   By: Jeannine Boga M.D.   On: 01/16/2014 17:06     Assessment & Plan:  Plan I have changed Casey Reyes's glucose blood. I am also having him maintain his ibuprofen, loratadine, tamsulosin, lisinopril, glimepiride, canagliflozin, zolpidem, and LYRICA.  Meds ordered this  encounter  Medications  . glucose blood (ONETOUCH VERIO) test strip    Sig: Onetouch Verio. Check Blood sugar once day. Dx: E119    Dispense:  100 each    Refill:  12    Problem List Items Addressed This Visit    Essential hypertension   Diabetes mellitus type II, uncontrolled - Primary    con't invokanna and glyburide Check labs in 3 months Pt given DM ed books, info  Call with any questions      Relevant Orders   Ambulatory referral to Ophthalmology      Follow-up: Return in about 3 months (around 12/06/2014), or if symptoms worsen or fail to improve, for diabetes II, hypertension, fasting.  Garnet Koyanagi, DO

## 2014-09-06 NOTE — Assessment & Plan Note (Signed)
con't invokanna and glyburide Check labs in 3 months Pt given DM ed books, info  Call with any questions

## 2014-10-04 ENCOUNTER — Telehealth: Payer: Self-pay | Admitting: Physician Assistant

## 2014-10-04 MED ORDER — TAMSULOSIN HCL 0.4 MG PO CAPS: 0.4000 mg | ORAL_CAPSULE | Freq: Every day | ORAL | Status: DC

## 2014-10-04 NOTE — Telephone Encounter (Signed)
[facility] [address] in [place]  tamsulosin (FLOMAX) 0.4 MG CAPS capsule Pt is out of meds

## 2014-10-04 NOTE — Telephone Encounter (Signed)
Medication has been sent.  

## 2014-10-20 ENCOUNTER — Telehealth: Payer: Self-pay | Admitting: Physician Assistant

## 2014-10-20 ENCOUNTER — Other Ambulatory Visit: Payer: Self-pay | Admitting: Physician Assistant

## 2014-10-20 DIAGNOSIS — E1165 Type 2 diabetes mellitus with hyperglycemia: Secondary | ICD-10-CM

## 2014-10-20 DIAGNOSIS — IMO0002 Reserved for concepts with insufficient information to code with codable children: Secondary | ICD-10-CM

## 2014-10-20 MED ORDER — CANAGLIFLOZIN 100 MG PO TABS: 100.0000 mg | ORAL_TABLET | Freq: Every day | ORAL | Status: DC

## 2014-10-20 NOTE — Telephone Encounter (Signed)
Relation to QX:AFHS Call back number: [phone] Pharmacy: [facility] [place], [place] [phone] (Phone) [phone] (Fax)         Reason for call:  Patient requesting a refill canagliflozin (INVOKANA) 100 MG TABS tablet. Patient states he is going out of town.

## 2014-10-20 NOTE — Telephone Encounter (Signed)
Denied.

## 2014-10-20 NOTE — Telephone Encounter (Signed)
Refill sent.

## 2014-10-20 NOTE — Telephone Encounter (Signed)
Caller name: wendy Relationship to [place] [address]  Can be reached:[phone] Pharmacy:  Reason for call:Tessalon pearls  Refill

## 2014-10-27 ENCOUNTER — Telehealth: Payer: Self-pay | Admitting: Physician Assistant

## 2014-10-27 ENCOUNTER — Other Ambulatory Visit: Payer: Self-pay | Admitting: Physician Assistant

## 2014-10-27 DIAGNOSIS — IMO0002 Reserved for concepts with insufficient information to code with codable children: Secondary | ICD-10-CM

## 2014-10-27 DIAGNOSIS — E1165 Type 2 diabetes mellitus with hyperglycemia: Secondary | ICD-10-CM

## 2014-10-27 MED ORDER — CANAGLIFLOZIN 100 MG PO TABS: 100.0000 mg | ORAL_TABLET | Freq: Every day | ORAL | Status: DC

## 2014-10-27 NOTE — Telephone Encounter (Signed)
Refill was sent in on 10/20/14. Sent Rx again to pharmacy listed.

## 2014-10-27 NOTE — Telephone Encounter (Signed)
Caller name: [place]: [facility] [place], [place] - [address] [phone] (Phone) [phone] (Fax)         Reason for call:  As per pharmacy never received canagliflozin (INVOKANA) 100 MG TABS tablet.

## 2015-01-24 ENCOUNTER — Telehealth: Payer: Self-pay | Admitting: Physician Assistant

## 2015-01-24 MED ORDER — LISINOPRIL 10 MG PO TABS: 10.0000 mg | ORAL_TABLET | Freq: Every day | ORAL | Status: DC

## 2015-01-24 MED ORDER — CANAGLIFLOZIN 100 MG PO TABS: 100.0000 mg | ORAL_TABLET | Freq: Every day | ORAL | Status: DC

## 2015-01-24 MED ORDER — GLIMEPIRIDE 2 MG PO TABS: 2.0000 mg | ORAL_TABLET | Freq: Every day | ORAL | Status: DC

## 2015-01-24 NOTE — Telephone Encounter (Signed)
Relation to [id number] Call back number:[phone] Pharmacy: [facility] [place] [address] [phone] (Phone) [phone] (Fax)         Reason for call:   Patient requesting a refill to hold him over until 6 month follow up appointment 01/30/2015  glimepiride (AMARYL) 2 MG tablet  canagliflozin (INVOKANA) 100 MG TABS tablet  lisinopril (PRINIVIL,ZESTRIL) 10 MG tablet

## 2015-01-24 NOTE — Telephone Encounter (Signed)
Rx's sent to the pharmacy by e-script.//AB/CMA 

## 2015-01-30 ENCOUNTER — Ambulatory Visit (INDEPENDENT_AMBULATORY_CARE_PROVIDER_SITE_OTHER): Payer: Managed Care, Other (non HMO) | Admitting: Physician Assistant

## 2015-01-30 ENCOUNTER — Encounter: Payer: Self-pay | Admitting: Physician Assistant

## 2015-01-30 VITALS — BP 118/80 | HR 103 | Temp 97.7°F | Ht 69.0 in | Wt 248.0 lb

## 2015-01-30 DIAGNOSIS — IMO0002 Reserved for concepts with insufficient information to code with codable children: Secondary | ICD-10-CM

## 2015-01-30 DIAGNOSIS — E1165 Type 2 diabetes mellitus with hyperglycemia: Secondary | ICD-10-CM

## 2015-01-30 DIAGNOSIS — Z23 Encounter for immunization: Secondary | ICD-10-CM

## 2015-01-30 DIAGNOSIS — J0100 Acute maxillary sinusitis, unspecified: Secondary | ICD-10-CM | POA: Insufficient documentation

## 2015-01-30 DIAGNOSIS — E1142 Type 2 diabetes mellitus with diabetic polyneuropathy: Secondary | ICD-10-CM | POA: Diagnosis not present

## 2015-01-30 LAB — LIPID PANEL
CHOL/HDL RATIO: 6
Cholesterol: 141 mg/dL (ref 0–200)
HDL: 24.7 mg/dL — ABNORMAL LOW (ref >39.00)
NONHDL: 115.89
TRIGLYCERIDES: 260 mg/dL — AB (ref 0.0–149.0)
VLDL: 52 mg/dL — ABNORMAL HIGH (ref 0.0–40.0)

## 2015-01-30 LAB — HEMOGLOBIN A1C: Hgb A1c MFr Bld: 7.4 % — ABNORMAL HIGH (ref 4.6–6.5)

## 2015-01-30 LAB — COMPREHENSIVE METABOLIC PANEL
ALT: 132 U/L — AB (ref 0–53)
AST: 83 U/L — ABNORMAL HIGH (ref 0–37)
Albumin: 4.3 g/dL (ref 3.5–5.2)
Alkaline Phosphatase: 86 U/L (ref 39–117)
BILIRUBIN TOTAL: 0.4 mg/dL (ref 0.2–1.2)
BUN: 21 mg/dL (ref 6–23)
CHLORIDE: 102 meq/L (ref 96–112)
CO2: 27 meq/L (ref 19–32)
Calcium: 9.9 mg/dL (ref 8.4–10.5)
Creatinine, Ser: 0.97 mg/dL (ref 0.40–1.50)
GFR: 101.56 mL/min (ref >60.00)
GLUCOSE: 188 mg/dL — AB (ref 70–99)
Potassium: 3.9 mEq/L (ref 3.5–5.1)
Sodium: 138 mEq/L (ref 135–145)
Total Protein: 7.8 g/dL (ref 6.0–8.3)

## 2015-01-30 LAB — LDL CHOLESTEROL, DIRECT: Direct LDL: 72 mg/dL

## 2015-01-30 MED ORDER — BENZONATATE 100 MG PO CAPS: 100.0000 mg | ORAL_CAPSULE | Freq: Two times a day (BID) | ORAL | Status: DC | PRN

## 2015-01-30 MED ORDER — LORATADINE 10 MG PO TABS: 10.0000 mg | ORAL_TABLET | Freq: Every day | ORAL | Status: DC | PRN

## 2015-01-30 MED ORDER — AMOXICILLIN-POT CLAVULANATE 875-125 MG PO TABS: 1.0000 | ORAL_TABLET | Freq: Two times a day (BID) | ORAL | Status: DC

## 2015-01-30 NOTE — Progress Notes (Signed)
Patient presents to clinic today for follow-up of uncontrolled DM II with neuropathy, previously non-compliant with medications. Has been out of medications for 1 month. Denies checking sugars. Body mass index is 36.61 kg/(m^2). BP is stable today at 11//80. Patient denies chest pain, palpitations, lightheadedness, dizziness, vision changes or frequent headaches.  BP Readings from Last 3 Encounters:  01/30/15 118/80  09/05/14 142/88  08/08/14 133/82   Patient c/o 3 weeks of sinus pressure and R-sided sinus pain. Denies fever but endorses fatigue, PND and constant dry cough. Denies chest congestion or chest pain. Has not taken anything for symptoms.   Past Medical History  Diagnosis Date  . Hypertension   . Diabetes mellitus without complication ([place])   . Chicken pox   . Hepatitis C     Current Outpatient Prescriptions on File Prior to Visit  Medication Sig Dispense Refill  . canagliflozin (INVOKANA) 100 MG TABS tablet Take 1 tablet (100 mg total) by mouth daily. 30 tablet 3  . glimepiride (AMARYL) 2 MG tablet Take 1 tablet (2 mg total) by mouth daily before breakfast. 30 tablet 3  . glucose blood (ONETOUCH VERIO) test strip Onetouch Verio. Check Blood sugar once day. Dx: E119 100 each 12  . ibuprofen (ADVIL,MOTRIN) 200 MG tablet Take 200 mg by mouth every 6 (six) hours as needed.    Marland Kitchen lisinopril (PRINIVIL,ZESTRIL) 10 MG tablet Take 1 tablet (10 mg total) by mouth daily. 30 tablet 3  . LYRICA 75 MG capsule TAKE ONE CAPSULE BY MOUTH TWICE DAILY 60 capsule 3  . tamsulosin (FLOMAX) 0.4 MG CAPS capsule Take 1 capsule (0.4 mg total) by mouth at bedtime. 30 capsule 5  . zolpidem (AMBIEN) 5 MG tablet Take 1 tablet (5 mg total) by mouth at bedtime as needed for sleep. 15 tablet 3   No current facility-administered medications on file prior to visit.    No Known Allergies  Family History  Problem Relation Age of Onset  . Heart attack Father     Deceased  . Heart disease Father   .  Diabetes Mother     Living  . Heart attack Brother 39    Deceased  . Sudden death Brother   . Thyroid disease Sister   . Diabetes Sister   . Healthy Sister     x1  . Colon cancer Brother     x1  . Heart disease Maternal Grandfather   . Allergies Daughter   . Diabetes Son   . Diabetes Maternal Grandmother     Social History   Social History  . Marital Status: Divorced    Spouse Name: N/A  . Number of Children: N/A  . Years of Education: N/A   Social History Main Topics  . Smoking status: Current Some Day Smoker -- 0.25 packs/day    Types: Cigarettes  . Smokeless tobacco: Never Used  . Alcohol Use: Yes  . Drug Use: No  . Sexual Activity: Not Asked   Other Topics Concern  . None   Social History Narrative   Review of Systems - See HPI.  All other ROS are negative.  BP 118/80 mmHg  Pulse 103  Temp(Src) 97.7 F (36.5 C) (Oral)  Ht 5\' 9"  (1.753 m)  Wt 248 lb (112.492 kg)  BMI 36.61 kg/m2  SpO2 97%  Physical Exam  Constitutional: He is oriented to person, place, and time and well-developed, well-nourished, and in no distress.  HENT:  Head: Normocephalic and atraumatic.  Right Ear: Tympanic  membrane normal.  Left Ear: Tympanic membrane normal.  Nose: Right sinus exhibits maxillary sinus tenderness. Left sinus exhibits maxillary sinus tenderness.  Mouth/Throat: Uvula is midline, oropharynx is clear and moist and mucous membranes are normal.  Eyes: Conjunctivae are normal.  Neck: Neck supple.  Cardiovascular: Normal rate, regular rhythm, normal heart sounds and intact distal pulses.   Pulmonary/Chest: Effort normal and breath sounds normal. No respiratory distress. He has no wheezes. He has no rales. He exhibits no tenderness.  Lymphadenopathy:    He has no cervical adenopathy.  Neurological: He is alert and oriented to person, place, and time.  Skin: Skin is warm and dry. No rash noted.  Vitals reviewed.  Diabetic Foot Form - Detailed   Diabetic Foot Exam -  detailed  Diabetic Foot exam was performed with the following findings:  Yes 01/30/2015  9:04 AM  Visual Foot Exam completed.:  Yes  Is there a history of foot ulcer?:  No  Can the patient see the bottom of their feet?:  Yes  Are the shoes appropriate in style and fit?:  Yes  Is there swelling or and abnormal foot shape?:  No  Are the toenails long?:  No  Are the toenails thick?:  Yes  Do you have pain in calf while walking?:  No  Is there a claw toe deformity?:  No  Is there elevated skin temparature?:  No  Is there limited skin dorsiflexion?:  No  Is there foot or ankle muscle weakness?:  No  Are the toenails ingrown?:  No  Normal Range of Motion:  Yes    Pulse Foot Exam completed.:  Yes  Right posterior Tibialias:  Present Left posterior Tibialias:  Present  Right Dorsalis Pedis:  Present Left Dorsalis Pedis:  Present  Sensory Foot Exam Completed.:  Yes  Swelling:  No  Semmes-Weinstein Monofilament Test  R Foot Test Control:  Neg L Foot Test Control:  Neg  R Site 1-Great Toe:  Neg L Site 1-Great Toe:  Neg  R Site 4:  Neg L Site 4:  Neg  R Site 5:  Neg L Site 5:  Neg       No results found for this or any previous visit (from the past 2160 hour(s)).  Assessment/Plan: Diabetes mellitus type II, uncontrolled Out of medications for 1 month due to lapse in insurance. Has coverage now and will pick up refills. Will repeat BMP and A1C. BP stable, continue lisinopril. Foot exam updated today. Patient to schedule eye exam as is overdue. Agrees to do so. Flu shot given today.  Acute maxillary sinusitis Rx Augmentin.  Increase fluids.  Rest.  Saline nasal spray.  Probiotic.  Mucinex as directed.  Humidifier in bedroom. Tessalon per orders.  Call or return to clinic if symptoms are not improving.

## 2015-01-30 NOTE — Patient Instructions (Signed)
Please go to the lab for blood work. I will call you with your results. Please restart diabetes medications.  Please take antibiotic as directed.  Increase fluid intake.  Use Saline nasal spray.  Take a daily multivitamin. Use Tessalon as directed for cough.  Place a humidifier in the bedroom.  Please call or return clinic if symptoms are not improving.  Sinusitis Sinusitis is redness, soreness, and swelling (inflammation) of the paranasal sinuses. Paranasal sinuses are air pockets within the bones of your face (beneath the eyes, the middle of the forehead, or above the eyes). In healthy paranasal sinuses, mucus is able to drain out, and air is able to circulate through them by way of your nose. However, when your paranasal sinuses are inflamed, mucus and air can become trapped. This can allow bacteria and other germs to grow and cause infection. Sinusitis can develop quickly and last only a short time (acute) or continue over a long period (chronic). Sinusitis that lasts for more than 12 weeks is considered chronic.  CAUSES  Causes of sinusitis include:  Allergies.  Structural abnormalities, such as displacement of the cartilage that separates your nostrils (deviated septum), which can decrease the air flow through your nose and sinuses and affect sinus drainage.  Functional abnormalities, such as when the small hairs (cilia) that line your sinuses and help remove mucus do not work properly or are not present. SYMPTOMS  Symptoms of acute and chronic sinusitis are the same. The primary symptoms are pain and pressure around the affected sinuses. Other symptoms include:  Upper toothache.  Earache.  Headache.  Bad breath.  Decreased sense of smell and taste.  A cough, which worsens when you are lying flat.  Fatigue.  Fever.  Thick drainage from your nose, which often is green and may contain pus (purulent).  Swelling and warmth over the affected sinuses. DIAGNOSIS  Your caregiver  will perform a physical exam. During the exam, your caregiver may:  Look in your nose for signs of abnormal growths in your nostrils (nasal polyps).  Tap over the affected sinus to check for signs of infection.  View the inside of your sinuses (endoscopy) with a special imaging device with a light attached (endoscope), which is inserted into your sinuses. If your caregiver suspects that you have chronic sinusitis, one or more of the following tests may be recommended:  Allergy tests.  Nasal culture A sample of mucus is taken from your nose and sent to a lab and screened for bacteria.  Nasal cytology A sample of mucus is taken from your nose and examined by your caregiver to determine if your sinusitis is related to an allergy. TREATMENT  Most cases of acute sinusitis are related to a viral infection and will resolve on their own within 10 days. Sometimes medicines are prescribed to help relieve symptoms (pain medicine, decongestants, nasal steroid sprays, or saline sprays).  However, for sinusitis related to a bacterial infection, your caregiver will prescribe antibiotic medicines. These are medicines that will help kill the bacteria causing the infection.  Rarely, sinusitis is caused by a fungal infection. In theses cases, your caregiver will prescribe antifungal medicine. For some cases of chronic sinusitis, surgery is needed. Generally, these are cases in which sinusitis recurs more than 3 times per year, despite other treatments. HOME CARE INSTRUCTIONS   Drink plenty of water. Water helps thin the mucus so your sinuses can drain more easily.  Use a humidifier.  Inhale steam 3 to 4 times a  day (for example, sit in the bathroom with the shower running).  Apply a warm, moist washcloth to your face 3 to 4 times a day, or as directed by your caregiver.  Use saline nasal sprays to help moisten and clean your sinuses.  Take over-the-counter or prescription medicines for pain, discomfort,  or fever only as directed by your caregiver. SEEK IMMEDIATE MEDICAL CARE IF:  You have increasing pain or severe headaches.  You have nausea, vomiting, or drowsiness.  You have swelling around your face.  You have vision problems.  You have a stiff neck.  You have difficulty breathing. MAKE SURE YOU:   Understand these instructions.  Will watch your condition.  Will get help right away if you are not doing well or get worse. Document Released: 01/13/2005 Document Revised: 04/07/2011 Document Reviewed: 01/28/2011 [facility] Patient Information 2014 [place], [place].

## 2015-01-30 NOTE — Progress Notes (Signed)
Pre visit review using our clinic review tool, if applicable. No additional management support is needed unless otherwise documented below in the visit note. 

## 2015-01-30 NOTE — Assessment & Plan Note (Signed)
Out of medications for 1 month due to lapse in insurance. Has coverage now and will pick up refills. Will repeat BMP and A1C. BP stable, continue lisinopril. Foot exam updated today. Patient to schedule eye exam as is overdue. Agrees to do so. Flu shot given today.

## 2015-01-30 NOTE — Assessment & Plan Note (Signed)
Rx Augmentin.  Increase fluids.  Rest.  Saline nasal spray.  Probiotic.  Mucinex as directed.  Humidifier in bedroom. Tessalon per orders.  Call or return to clinic if symptoms are not improving.

## 2015-02-02 ENCOUNTER — Telehealth: Payer: Self-pay

## 2015-02-02 NOTE — Telephone Encounter (Signed)
Called patient regarding lab results.

## 2015-02-05 ENCOUNTER — Ambulatory Visit: Payer: Self-pay | Admitting: Physician Assistant

## 2015-02-16 ENCOUNTER — Telehealth: Payer: Self-pay | Admitting: Physician Assistant

## 2015-02-16 MED ORDER — PREGABALIN 75 MG PO CAPS: 75.0000 mg | ORAL_CAPSULE | Freq: Two times a day (BID) | ORAL | Status: DC

## 2015-02-16 NOTE — Telephone Encounter (Signed)
Prescription printed and signed.//AB/CMA

## 2015-02-16 NOTE — Telephone Encounter (Signed)
Relation to pt: self Call back number: Pharmacy:  [facility], [place] [address]  [phone]    Reason for call:  Patient requesting a refill for LYRICA 75 MG capsule and requesting a coupon as well to be sent to pharmacy. Please Arnoldo Hooker

## 2015-02-16 NOTE — Telephone Encounter (Signed)
I do not have license to treat in [place] so I cannot send a medication there.  I can send to an MD here to send out of state but it will be at their discretion whether or not they are willing to fill it.  Dr. Birdie Riddle has agreed to sign. Please print Rx in her name for signature and I will take care of the rest.

## 2015-02-19 ENCOUNTER — Telehealth: Payer: Self-pay | Admitting: Physician Assistant

## 2015-02-19 NOTE — Telephone Encounter (Signed)
Spoke with pharmacist at [facility] in [place] and confirmed that they transferred [place] from [facility] and it is ready for pt to pick up. They did not receive coupon. Faxed co-pay savings care to [place] store and left detailed message on pt's cell# and to call if any further questions.

## 2015-02-19 NOTE — Telephone Encounter (Signed)
Pt called back, he states that he is currently at the pharmacy in [place] and they informed him that they have not received a Rx or a coupon.   Pt is requesting a call back to discuss and confirm further.

## 2015-02-19 NOTE — Telephone Encounter (Signed)
Called pt, lvm informing pt of the below.    Thanks.

## 2015-02-19 NOTE — Telephone Encounter (Signed)
Coupon was faxed with prescription to pharmacy in [place] last Friday. He needs to check with the Pharmacy to see if they have it on file.

## 2015-02-19 NOTE — Telephone Encounter (Signed)
Caller name: Allie   Relationship to patient: Self   Can be reached: [phone]  Pharmacy:  [facility] [place], [place]  Reason for call: Pt called in to see if PCP has a coupon for Lyrica, he says that he is almost out.   Please assist further.

## 2015-02-20 NOTE — Telephone Encounter (Signed)
Spoke with pharmacist, they never received coupon. Gave pharmacist #s off card, status showed deactivated. Printed new coupon from [email or web address] and gave new #s. Rx will now be $25. Notified pt.

## 2015-02-20 NOTE — Telephone Encounter (Signed)
Plse fax Coupon to [phone]  Patient called very hateful and disrespectful because he said the pharmacy did not get the coupon he was told they would get yesterday. Could not give him details from message below below because he was so irate and hostile. Stated that he had called the pharmacy and they told him that they had not received anything from our hour, so he felt that when he spoke with [place] yesterday she lied to him. Patient hung up before I could get a call back number.

## 2015-02-20 NOTE — Telephone Encounter (Signed)
Refaxed coupon. Spoke with pt and advised him that I will call pharmacy to verify fax # I have been using as it is different than the # he provided. He told me to ask for [facility]. Spoke with Eustaquio Maize and verified correct # is [phone]. Same # I have been faxing coupon to. She tells me that it is not showing up in her fax que yet but she will keep an eye out for it. Will call later this evening to ensure they received fax.

## 2015-03-21 ENCOUNTER — Other Ambulatory Visit: Payer: Self-pay | Admitting: Physician Assistant

## 2015-03-28 ENCOUNTER — Telehealth: Payer: Self-pay | Admitting: Physician Assistant

## 2015-03-28 MED ORDER — PREGABALIN 75 MG PO CAPS: 75.0000 mg | ORAL_CAPSULE | Freq: Two times a day (BID) | ORAL | Status: DC

## 2015-03-28 NOTE — Telephone Encounter (Signed)
Rx reprinted and faxed to the pharmacy.  Confirmation received.  Called and [facility] @ 4:08pm @ [phone]) informing the pt that I faxed the prescription to the [facility] in [place].//AB/CMA

## 2015-03-28 NOTE — Telephone Encounter (Signed)
Relation to [id number] Call back [phone] Pharmacy: [facility] [address] [phone] (Phone) [phone] (Fax)         Reason for call:  Patient requesting a refill LYRICA 75 MG capsule

## 2015-06-04 ENCOUNTER — Ambulatory Visit (INDEPENDENT_AMBULATORY_CARE_PROVIDER_SITE_OTHER): Payer: Managed Care, Other (non HMO) | Admitting: Physician Assistant

## 2015-06-04 ENCOUNTER — Encounter: Payer: Self-pay | Admitting: Physician Assistant

## 2015-06-04 VITALS — BP 126/68 | HR 89 | Temp 97.5°F | Ht 69.0 in | Wt 256.6 lb

## 2015-06-04 DIAGNOSIS — Z125 Encounter for screening for malignant neoplasm of prostate: Secondary | ICD-10-CM | POA: Insufficient documentation

## 2015-06-04 DIAGNOSIS — N400 Enlarged prostate without lower urinary tract symptoms: Secondary | ICD-10-CM | POA: Insufficient documentation

## 2015-06-04 DIAGNOSIS — E1142 Type 2 diabetes mellitus with diabetic polyneuropathy: Secondary | ICD-10-CM | POA: Diagnosis not present

## 2015-06-04 DIAGNOSIS — E1165 Type 2 diabetes mellitus with hyperglycemia: Secondary | ICD-10-CM

## 2015-06-04 DIAGNOSIS — IMO0002 Reserved for concepts with insufficient information to code with codable children: Secondary | ICD-10-CM

## 2015-06-04 DIAGNOSIS — I10 Essential (primary) hypertension: Secondary | ICD-10-CM | POA: Diagnosis not present

## 2015-06-04 DIAGNOSIS — E1149 Type 2 diabetes mellitus with other diabetic neurological complication: Secondary | ICD-10-CM

## 2015-06-04 MED ORDER — PREGABALIN 100 MG PO CAPS: 100.0000 mg | ORAL_CAPSULE | Freq: Two times a day (BID) | ORAL | Status: DC

## 2015-06-04 MED ORDER — LOSARTAN POTASSIUM 50 MG PO TABS: 50.0000 mg | ORAL_TABLET | Freq: Every day | ORAL | Status: DC

## 2015-06-04 MED ORDER — TAMSULOSIN HCL 0.4 MG PO CAPS: 0.8000 mg | ORAL_CAPSULE | Freq: Every day | ORAL | Status: DC

## 2015-06-04 MED ORDER — GLUCOSE BLOOD VI STRP: ORAL_STRIP | Status: DC

## 2015-06-04 NOTE — Patient Instructions (Signed)
Please schedule a lab appointment for tomorrow morning so we can get fasting labs.  Please start the new doses of Flomax and the Lyrica. Stop smoking ! Let me know if you would like me to help further with this> Start a daily B Complex Vitamin.   Please stop the lisinopril (due to cough). Start the Losartan daily as directed.  Continue diabetes medications. Do no skip meals while on these medications.  Follow-up in 1 month.

## 2015-06-04 NOTE — Assessment & Plan Note (Signed)
Well controlled but patient endorses cough since start of ACEI. Will stop lisinopril and begin losartan 50 mg daily. DASH diet reviewed. FU 1 month.

## 2015-06-04 NOTE — Assessment & Plan Note (Signed)
Increase Lyrica 100 mg BID. Smoking cessation discussed. Patient to increase efforts. Will begin B Complex vitamin. FU 1 month.

## 2015-06-04 NOTE — Progress Notes (Signed)
Patient presents to clinic today for follow-up of diabetes mellitus II, recently controlled with A1C at 7.4. Is currently on regimen of Invokana 100 mg daily and Glimepiride 2 mg daily. Has continued ACEI as directed. Fasting sugars are averaging around 120-130. Endorses some low sugars if he takes medication but does not eat anything.  Patient has been taking his Lyrica 75 mg BID. Endorses continued neuropathic pain. He is still smoking daily although he states he has cut back considerably.   Patient also notes some continued nocturia, although improved with Flomax 0.4 mg daily. Endorses taking as directed. Denies hesitancy or frequency. Is overdue for repeat PSA testing.   Past Medical History  Diagnosis Date  . Hypertension   . Diabetes mellitus without complication ([place])   . Chicken pox   . Hepatitis C     Current Outpatient Prescriptions on File Prior to Visit  Medication Sig Dispense Refill  . canagliflozin (INVOKANA) 100 MG TABS tablet Take 1 tablet (100 mg total) by mouth daily. 30 tablet 3  . glimepiride (AMARYL) 2 MG tablet Take 1 tablet (2 mg total) by mouth daily before breakfast. 30 tablet 3  . ibuprofen (ADVIL,MOTRIN) 200 MG tablet Take 200 mg by mouth every 6 (six) hours as needed. Reported on 06/04/2015    . loratadine (CLARITIN) 10 MG tablet Take 1 tablet (10 mg total) by mouth daily as needed for allergies. 30 tablet 5   No current facility-administered medications on file prior to visit.    No Known Allergies  Family History  Problem Relation Age of Onset  . Heart attack Father     Deceased  . Heart disease Father   . Diabetes Mother     Living  . Heart attack Brother 63    Deceased  . Sudden death Brother   . Thyroid disease Sister   . Diabetes Sister   . Healthy Sister     x1  . Colon cancer Brother     x1  . Heart disease Maternal Grandfather   . Allergies Daughter   . Diabetes Son   . Diabetes Maternal Grandmother     Social History   Social  History  . Marital Status: Divorced    Spouse Name: N/A  . Number of Children: N/A  . Years of Education: N/A   Social History Main Topics  . Smoking status: Current Some Day Smoker -- 0.25 packs/day    Types: Cigarettes  . Smokeless tobacco: Never Used  . Alcohol Use: Yes  . Drug Use: No  . Sexual Activity: Not Asked   Other Topics Concern  . None   Social History Narrative   Review of Systems - See HPI.  All other ROS are negative.  BP 126/68 mmHg  Pulse 89  Temp(Src) 97.5 F (36.4 C) (Oral)  Ht 5\' 9"  (1.753 m)  Wt 256 lb 9.6 oz (116.393 kg)  BMI 37.88 kg/m2  SpO2 98%  Physical Exam  Constitutional: He is oriented to person, place, and time and well-developed, well-nourished, and in no distress.  HENT:  Head: Normocephalic and atraumatic.  Eyes: Conjunctivae are normal.  Neck: Neck supple.  Cardiovascular: Normal rate, regular rhythm, normal heart sounds and intact distal pulses.   Pulmonary/Chest: Effort normal and breath sounds normal. No respiratory distress. He has no wheezes. He has no rales. He exhibits no tenderness.  Neurological: He is alert and oriented to person, place, and time.  Skin: Skin is warm and dry. No rash noted.  Psychiatric: Affect normal.  Vitals reviewed.  Assessment/Plan: Essential hypertension Well controlled but patient endorses cough since start of ACEI. Will stop lisinopril and begin losartan 50 mg daily. DASH diet reviewed. FU 1 month.  Diabetes mellitus type II, uncontrolled Patient to return for labs -- BMP, A1C. Sugars improved. Will continue Invokana and Amaryl at current doses. Diet and exercise reviewed. Neuropathy is still an issue. Will increase Lyrica to 100 mg BID. Smoking cessation encouraged. Will begin B Complex vitamin.  Diabetic neuropathy Increase Lyrica 100 mg BID. Smoking cessation discussed. Patient to increase efforts. Will begin B Complex vitamin. FU 1 month.  BPH (benign prostatic hyperplasia) Will increase  Flomax to 0.8 mg nightly. Will repeat PSA today. Discussed other options to include Finasteride and Urology consult for TURP, etc. Patient wishes to defer until trying new dose of Flomax.   Prostate cancer screening Will check PSA level as is overdue. The natural history of prostate cancer and ongoing controversy regarding screening and potential treatment outcomes of prostate cancer has been discussed with the patient. The meaning of a false positive PSA and a false negative PSA has been discussed. He indicates understanding of the limitations of this screening test and wishes to proceed with screening PSA testing.

## 2015-06-04 NOTE — Assessment & Plan Note (Signed)
Patient to return for labs -- BMP, A1C. Sugars improved. Will continue Invokana and Amaryl at current doses. Diet and exercise reviewed. Neuropathy is still an issue. Will increase Lyrica to 100 mg BID. Smoking cessation encouraged. Will begin B Complex vitamin.

## 2015-06-04 NOTE — Assessment & Plan Note (Signed)
Will increase Flomax to 0.8 mg nightly. Will repeat PSA today. Discussed other options to include Finasteride and Urology consult for TURP, etc. Patient wishes to defer until trying new dose of Flomax.

## 2015-06-04 NOTE — Assessment & Plan Note (Addendum)
Will check PSA level as is overdue. The natural history of prostate cancer and ongoing controversy regarding screening and potential treatment outcomes of prostate cancer has been discussed with the patient. The meaning of a false positive PSA and a false negative PSA has been discussed. He indicates understanding of the limitations of this screening test and wishes to proceed with screening PSA testing.

## 2015-06-04 NOTE — Progress Notes (Signed)
Pre visit review using our clinic review tool, if applicable. No additional management support is needed unless otherwise documented below in the visit note. 

## 2015-06-05 ENCOUNTER — Other Ambulatory Visit (INDEPENDENT_AMBULATORY_CARE_PROVIDER_SITE_OTHER): Payer: Managed Care, Other (non HMO)

## 2015-06-05 DIAGNOSIS — E1165 Type 2 diabetes mellitus with hyperglycemia: Secondary | ICD-10-CM

## 2015-06-05 DIAGNOSIS — Z125 Encounter for screening for malignant neoplasm of prostate: Secondary | ICD-10-CM

## 2015-06-05 DIAGNOSIS — E1142 Type 2 diabetes mellitus with diabetic polyneuropathy: Secondary | ICD-10-CM

## 2015-06-05 DIAGNOSIS — IMO0002 Reserved for concepts with insufficient information to code with codable children: Secondary | ICD-10-CM

## 2015-06-05 LAB — COMPREHENSIVE METABOLIC PANEL
ALK PHOS: 64 U/L (ref 39–117)
ALT: 171 U/L — ABNORMAL HIGH (ref 0–53)
AST: 137 U/L — ABNORMAL HIGH (ref 0–37)
Albumin: 4.4 g/dL (ref 3.5–5.2)
BUN: 9 mg/dL (ref 6–23)
CHLORIDE: 103 meq/L (ref 96–112)
CO2: 22 meq/L (ref 19–32)
Calcium: 9.6 mg/dL (ref 8.4–10.5)
Creatinine, Ser: 0.79 mg/dL (ref 0.40–1.50)
GFR: 128.55 mL/min (ref >60.00)
GLUCOSE: 161 mg/dL — AB (ref 70–99)
POTASSIUM: 4 meq/L (ref 3.5–5.1)
SODIUM: 141 meq/L (ref 135–145)
TOTAL PROTEIN: 7.3 g/dL (ref 6.0–8.3)
Total Bilirubin: 0.6 mg/dL (ref 0.2–1.2)

## 2015-06-05 LAB — HEMOGLOBIN A1C: Hgb A1c MFr Bld: 8.7 % — ABNORMAL HIGH (ref 4.6–6.5)

## 2015-06-05 LAB — PSA: PSA: 1.09 ng/mL (ref 0.10–4.00)

## 2015-06-07 ENCOUNTER — Telehealth: Payer: Self-pay | Admitting: Physician Assistant

## 2015-06-07 NOTE — Telephone Encounter (Signed)
Pt was advised of the recent lab results and he voices understanding to call and set up that appointment. I asked the pt about scheduling a follow up while I had him on the phone and he declined and he stated that he was going out of town this weekend and he would call back next week to set up that appointment. I also sent a reminder letter to the patient to call back and set up the 1 month appointment per cody's last note.

## 2015-06-07 NOTE — Telephone Encounter (Signed)
Pt would like to know if his results are back from his labs that was done on Monday.

## 2015-06-11 ENCOUNTER — Encounter: Payer: Self-pay | Admitting: Physician Assistant

## 2015-06-11 ENCOUNTER — Ambulatory Visit (INDEPENDENT_AMBULATORY_CARE_PROVIDER_SITE_OTHER): Payer: Managed Care, Other (non HMO) | Admitting: Physician Assistant

## 2015-06-11 VITALS — BP 138/88 | HR 91 | Temp 98.3°F | Resp 18 | Ht 67.0 in | Wt 255.1 lb

## 2015-06-11 DIAGNOSIS — B9689 Other specified bacterial agents as the cause of diseases classified elsewhere: Secondary | ICD-10-CM

## 2015-06-11 DIAGNOSIS — J019 Acute sinusitis, unspecified: Secondary | ICD-10-CM

## 2015-06-11 MED ORDER — GLIMEPIRIDE 2 MG PO TABS: 2.0000 mg | ORAL_TABLET | Freq: Every day | ORAL | Status: DC

## 2015-06-11 MED ORDER — AMOXICILLIN-POT CLAVULANATE 875-125 MG PO TABS: 1.0000 | ORAL_TABLET | Freq: Two times a day (BID) | ORAL | Status: DC

## 2015-06-11 MED ORDER — FLUTICASONE PROPIONATE 50 MCG/ACT NA SUSP: NASAL | Status: DC

## 2015-06-11 MED ORDER — TRAMADOL HCL 50 MG PO TABS: 50.0000 mg | ORAL_TABLET | Freq: Two times a day (BID) | ORAL | Status: DC | PRN

## 2015-06-11 NOTE — Progress Notes (Signed)
Pre visit review using our clinic review tool, if applicable. No additional management support is needed unless otherwise documented below in the visit note/SLS  

## 2015-06-11 NOTE — Progress Notes (Signed)
Patient presents to clinic today c/o 5 days of progressive worsening head congestion with sinus pressure, sinus pain and now with cough productive of yellow sputum. Endorses right-sided facial pain. Denies fever, chills. Denies taking anything OTC for symptom relief.   Past Medical History  Diagnosis Date  . Hypertension   . Diabetes mellitus without complication ([place])   . Chicken pox   . Hepatitis C     Current Outpatient Prescriptions on File Prior to Visit  Medication Sig Dispense Refill  . canagliflozin (INVOKANA) 100 MG TABS tablet Take 1 tablet (100 mg total) by mouth daily. 30 tablet 3  . fluticasone (FLONASE) 50 MCG/ACT nasal spray USE 2 SPRAYS IN EACH NOSTRIL QD FOR SINUS CONGESTION  0  . glimepiride (AMARYL) 2 MG tablet Take 1 tablet (2 mg total) by mouth daily before breakfast. 30 tablet 3  . glucose blood (ONETOUCH VERIO) test strip Onetouch Verio. Check Blood sugar once day. Dx: E119 100 each 12  . ibuprofen (ADVIL,MOTRIN) 200 MG tablet Take 200 mg by mouth every 6 (six) hours as needed. Reported on 06/04/2015    . loratadine (CLARITIN) 10 MG tablet Take 1 tablet (10 mg total) by mouth daily as needed for allergies. 30 tablet 5  . losartan (COZAAR) 50 MG tablet Take 1 tablet (50 mg total) by mouth daily. 90 tablet 3  . pregabalin (LYRICA) 100 MG capsule Take 1 capsule (100 mg total) by mouth 2 (two) times daily. 60 capsule 1  . tamsulosin (FLOMAX) 0.4 MG CAPS capsule Take 2 capsules (0.8 mg total) by mouth daily. 60 capsule 3   No current facility-administered medications on file prior to visit.    No Known Allergies  Family History  Problem Relation Age of Onset  . Heart attack Father     Deceased  . Heart disease Father   . Diabetes Mother     Living  . Heart attack Brother 69    Deceased  . Sudden death Brother   . Thyroid disease Sister   . Diabetes Sister   . Healthy Sister     x1  . Colon cancer Brother     x1  . Heart disease Maternal Grandfather   .  Allergies Daughter   . Diabetes Son   . Diabetes Maternal Grandmother     Social History   Social History  . Marital Status: Divorced    Spouse Name: N/A  . Number of Children: N/A  . Years of Education: N/A   Social History Main Topics  . Smoking status: Current Some Day Smoker -- 0.25 packs/day    Types: Cigarettes  . Smokeless tobacco: Never Used  . Alcohol Use: Yes  . Drug Use: No  . Sexual Activity: Not Asked   Other Topics Concern  . None   Social History Narrative   Review of Systems - See HPI.  All other ROS are negative.  BP 138/88 mmHg  Pulse 91  Temp(Src) 98.3 F (36.8 C) (Oral)  Resp 18  Ht '5\' 7"'$  (1.702 m)  Wt 255 lb 2 oz (115.724 kg)  BMI 39.95 kg/m2  SpO2 97%  Physical Exam  Constitutional: He is oriented to person, place, and time and well-developed, well-nourished, and in no distress.  HENT:  Head: Normocephalic and atraumatic.  Right Ear: Tympanic membrane normal.  Left Ear: Tympanic membrane normal.  Nose: Mucosal edema and rhinorrhea present. Right sinus exhibits maxillary sinus tenderness.  Mouth/Throat: Uvula is midline, oropharynx is clear and moist and  mucous membranes are normal.  Eyes: Conjunctivae are normal.  Cardiovascular: Normal rate, regular rhythm, normal heart sounds and intact distal pulses.   Pulmonary/Chest: Effort normal and breath sounds normal. No respiratory distress. He has no wheezes. He has no rales. He exhibits no tenderness.  Neurological: He is alert and oriented to person, place, and time.  Skin: Skin is warm and dry. No rash noted.  Psychiatric: Affect normal.  Vitals reviewed.   Recent Results (from the past 2160 hour(s))  Comp Met (CMET)     Status: Abnormal   Collection Time: 06/05/15  8:30 AM  Result Value Ref Range   Sodium 141 135 - 145 mEq/L   Potassium 4.0 3.5 - 5.1 mEq/L   Chloride 103 96 - 112 mEq/L   CO2 22 19 - 32 mEq/L   Glucose, Bld 161 (H) 70 - 99 mg/dL   BUN 9 6 - 23 mg/dL   Creatinine,  Ser 0.79 0.40 - 1.50 mg/dL   Total Bilirubin 0.6 0.2 - 1.2 mg/dL   Alkaline Phosphatase 64 39 - 117 U/L   AST 137 (H) 0 - 37 U/L   ALT 171 (H) 0 - 53 U/L   Total Protein 7.3 6.0 - 8.3 g/dL   Albumin 4.4 3.5 - 5.2 g/dL   Calcium 9.6 8.4 - 10.5 mg/dL   GFR 128.55 >60.00 mL/min  Hemoglobin A1c     Status: Abnormal   Collection Time: 06/05/15  8:30 AM  Result Value Ref Range   Hgb A1c MFr Bld 8.7 (H) 4.6 - 6.5 %    Comment: Glycemic Control Guidelines for People with Diabetes:Non Diabetic:  <6%Goal of Therapy: <7%Additional Action Suggested:  >8%   PSA     Status: None   Collection Time: 06/05/15  8:30 AM  Result Value Ref Range   PSA 1.09 0.10 - 4.00 ng/mL    Assessment/Plan: 1. Acute bacterial sinusitis Rx Augmentin.  Increase fluids.  Rest.  Saline nasal spray.  Probiotic.  Mucinex as directed.  Humidifier in bedroom. Tramadol for severe pain. Use sparingly.  Call or return to clinic if symptoms are not improving.  - fluticasone (FLONASE) 50 MCG/ACT nasal spray; USE 2 SPRAYS IN EACH NOSTRIL QD FOR SINUS CONGESTION  Dispense: 16 g; Refill: 0 - amoxicillin-clavulanate (AUGMENTIN) 875-125 MG tablet; Take 1 tablet by mouth 2 (two) times daily.  Dispense: 14 tablet; Refill: 0 - traMADol (ULTRAM) 50 MG tablet; Take 1 tablet (50 mg total) by mouth every 12 (twelve) hours as needed for severe pain.  Dispense: 10 tablet; Refill: 0

## 2015-06-11 NOTE — Patient Instructions (Signed)
Please take antibiotic as directed.  Increase fluid intake.  Use Saline nasal spray.  Take a daily multivitamin. Use pain medication very sparingly.  Place a humidifier in the bedroom.  Please call or return clinic if symptoms are not improving.  Sinusitis Sinusitis is redness, soreness, and swelling (inflammation) of the paranasal sinuses. Paranasal sinuses are air pockets within the bones of your face (beneath the eyes, the middle of the forehead, or above the eyes). In healthy paranasal sinuses, mucus is able to drain out, and air is able to circulate through them by way of your nose. However, when your paranasal sinuses are inflamed, mucus and air can become trapped. This can allow bacteria and other germs to grow and cause infection. Sinusitis can develop quickly and last only a short time (acute) or continue over a long period (chronic). Sinusitis that lasts for more than 12 weeks is considered chronic.  CAUSES  Causes of sinusitis include:  Allergies.  Structural abnormalities, such as displacement of the cartilage that separates your nostrils (deviated septum), which can decrease the air flow through your nose and sinuses and affect sinus drainage.  Functional abnormalities, such as when the small hairs (cilia) that line your sinuses and help remove mucus do not work properly or are not present. SYMPTOMS  Symptoms of acute and chronic sinusitis are the same. The primary symptoms are pain and pressure around the affected sinuses. Other symptoms include:  Upper toothache.  Earache.  Headache.  Bad breath.  Decreased sense of smell and taste.  A cough, which worsens when you are lying flat.  Fatigue.  Fever.  Thick drainage from your nose, which often is green and may contain pus (purulent).  Swelling and warmth over the affected sinuses. DIAGNOSIS  Your caregiver will perform a physical exam. During the exam, your caregiver may:  Look in your nose for signs of abnormal  growths in your nostrils (nasal polyps).  Tap over the affected sinus to check for signs of infection.  View the inside of your sinuses (endoscopy) with a special imaging device with a light attached (endoscope), which is inserted into your sinuses. If your caregiver suspects that you have chronic sinusitis, one or more of the following tests may be recommended:  Allergy tests.  Nasal culture A sample of mucus is taken from your nose and sent to a lab and screened for bacteria.  Nasal cytology A sample of mucus is taken from your nose and examined by your caregiver to determine if your sinusitis is related to an allergy. TREATMENT  Most cases of acute sinusitis are related to a viral infection and will resolve on their own within 10 days. Sometimes medicines are prescribed to help relieve symptoms (pain medicine, decongestants, nasal steroid sprays, or saline sprays).  However, for sinusitis related to a bacterial infection, your caregiver will prescribe antibiotic medicines. These are medicines that will help kill the bacteria causing the infection.  Rarely, sinusitis is caused by a fungal infection. In theses cases, your caregiver will prescribe antifungal medicine. For some cases of chronic sinusitis, surgery is needed. Generally, these are cases in which sinusitis recurs more than 3 times per year, despite other treatments. HOME CARE INSTRUCTIONS   Drink plenty of water. Water helps thin the mucus so your sinuses can drain more easily.  Use a humidifier.  Inhale steam 3 to 4 times a day (for example, sit in the bathroom with the shower running).  Apply a warm, moist washcloth to your face 3  to 4 times a day, or as directed by your caregiver.  Use saline nasal sprays to help moisten and clean your sinuses.  Take over-the-counter or prescription medicines for pain, discomfort, or fever only as directed by your caregiver. SEEK IMMEDIATE MEDICAL CARE IF:  You have increasing pain or  severe headaches.  You have nausea, vomiting, or drowsiness.  You have swelling around your face.  You have vision problems.  You have a stiff neck.  You have difficulty breathing. MAKE SURE YOU:   Understand these instructions.  Will watch your condition.  Will get help right away if you are not doing well or get worse. Document Released: 01/13/2005 Document Revised: 04/07/2011 Document Reviewed: 01/28/2011 Exit[employer] Patient Information 2014 [employer].   

## 2015-06-18 ENCOUNTER — Ambulatory Visit (INDEPENDENT_AMBULATORY_CARE_PROVIDER_SITE_OTHER): Payer: Managed Care, Other (non HMO) | Admitting: Physician Assistant

## 2015-06-18 ENCOUNTER — Encounter: Payer: Self-pay | Admitting: Physician Assistant

## 2015-06-18 VITALS — BP 120/80 | HR 87 | Temp 98.1°F | Ht 67.0 in | Wt 251.6 lb

## 2015-06-18 DIAGNOSIS — J019 Acute sinusitis, unspecified: Secondary | ICD-10-CM | POA: Diagnosis not present

## 2015-06-18 DIAGNOSIS — B9689 Other specified bacterial agents as the cause of diseases classified elsewhere: Secondary | ICD-10-CM

## 2015-06-18 MED ORDER — AMOXICILLIN-POT CLAVULANATE 875-125 MG PO TABS
1.0000 | ORAL_TABLET | Freq: Two times a day (BID) | ORAL | Status: DC
Start: 2015-06-18 — End: 2016-02-08

## 2015-06-18 NOTE — Progress Notes (Signed)
Patient presents to clinic today for follow-up of sinusitis. Patient completed Augmentin yesterday. Endorses symptoms are markedly improved but endorses continued sinus pressure/pain in R maxillary region. Endorses some dental pain. Denies fever, chills. Denies residual chest congestion or cough.   Past Medical History  Diagnosis Date  . Hypertension   . Diabetes mellitus without complication ([place])   . Chicken pox   . Hepatitis C     Current Outpatient Prescriptions on File Prior to Visit  Medication Sig Dispense Refill  . canagliflozin (INVOKANA) 100 MG TABS tablet Take 1 tablet (100 mg total) by mouth daily. 30 tablet 3  . fluticasone (FLONASE) 50 MCG/ACT nasal spray USE 2 SPRAYS IN EACH NOSTRIL QD FOR SINUS CONGESTION 16 g 0  . glimepiride (AMARYL) 2 MG tablet Take 1 tablet (2 mg total) by mouth daily before breakfast. 30 tablet 3  . ibuprofen (ADVIL,MOTRIN) 200 MG tablet Take 200 mg by mouth every 6 (six) hours as needed. Reported on 06/04/2015    . loratadine (CLARITIN) 10 MG tablet Take 1 tablet (10 mg total) by mouth daily as needed for allergies. 30 tablet 5  . losartan (COZAAR) 50 MG tablet Take 1 tablet (50 mg total) by mouth daily. 90 tablet 3  . pregabalin (LYRICA) 100 MG capsule Take 1 capsule (100 mg total) by mouth 2 (two) times daily. 60 capsule 1  . tamsulosin (FLOMAX) 0.4 MG CAPS capsule Take 2 capsules (0.8 mg total) by mouth daily. 60 capsule 3  . traMADol (ULTRAM) 50 MG tablet Take 1 tablet (50 mg total) by mouth every 12 (twelve) hours as needed for severe pain. 10 tablet 0  . glucose blood (ONETOUCH VERIO) test strip Onetouch Verio. Check Blood sugar once day. Dx: E119 (Patient not taking: Reported on 06/18/2015) 100 each 12   No current facility-administered medications on file prior to visit.    No Known Allergies  Family History  Problem Relation Age of Onset  . Heart attack Father     Deceased  . Heart disease Father   . Diabetes Mother     Living  .  Heart attack Brother 66    Deceased  . Sudden death Brother   . Thyroid disease Sister   . Diabetes Sister   . Healthy Sister     x1  . Colon cancer Brother     x1  . Heart disease Maternal Grandfather   . Allergies Daughter   . Diabetes Son   . Diabetes Maternal Grandmother     Social History   Social History  . Marital Status: Divorced    Spouse Name: N/A  . Number of Children: N/A  . Years of Education: N/A   Social History Main Topics  . Smoking status: Current Some Day Smoker -- 0.25 packs/day    Types: Cigarettes  . Smokeless tobacco: Never Used  . Alcohol Use: Yes  . Drug Use: No  . Sexual Activity: Not Asked   Other Topics Concern  . None   Social History Narrative   Review of Systems - See HPI.  All other ROS are negative.  BP 120/80 mmHg  Pulse 87  Temp(Src) 98.1 F (36.7 C) (Oral)  Ht '5\' 7"'$  (1.702 m)  Wt 251 lb 9.6 oz (114.125 kg)  BMI 39.40 kg/m2  SpO2 97%  Physical Exam  Constitutional: He is oriented to person, place, and time and well-developed, well-nourished, and in no distress.  HENT:  Head: Normocephalic and atraumatic.  Right Ear: Tympanic membrane  normal.  Left Ear: Tympanic membrane normal.  Nose: Right sinus exhibits maxillary sinus tenderness.  Mouth/Throat: No oral lesions. Dental caries present. No dental abscesses.  Eyes: Conjunctivae are normal.  Neck: Neck supple.  Cardiovascular: Normal rate, regular rhythm, normal heart sounds and intact distal pulses.   Pulmonary/Chest: Effort normal and breath sounds normal. No respiratory distress. He has no wheezes. He has no rales. He exhibits no tenderness.  Neurological: He is alert and oriented to person, place, and time.  Skin: Skin is warm and dry. No rash noted.  Psychiatric: Affect normal.  Vitals reviewed.  Recent Results (from the past 2160 hour(s))  Comp Met (CMET)     Status: Abnormal   Collection Time: 06/05/15  8:30 AM  Result Value Ref Range   Sodium 141 135 - 145  mEq/L   Potassium 4.0 3.5 - 5.1 mEq/L   Chloride 103 96 - 112 mEq/L   CO2 22 19 - 32 mEq/L   Glucose, Bld 161 (H) 70 - 99 mg/dL   BUN 9 6 - 23 mg/dL   Creatinine, Ser 0.79 0.40 - 1.50 mg/dL   Total Bilirubin 0.6 0.2 - 1.2 mg/dL   Alkaline Phosphatase 64 39 - 117 U/L   AST 137 (H) 0 - 37 U/L   ALT 171 (H) 0 - 53 U/L   Total Protein 7.3 6.0 - 8.3 g/dL   Albumin 4.4 3.5 - 5.2 g/dL   Calcium 9.6 8.4 - 10.5 mg/dL   GFR 128.55 >60.00 mL/min  Hemoglobin A1c     Status: Abnormal   Collection Time: 06/05/15  8:30 AM  Result Value Ref Range   Hgb A1c MFr Bld 8.7 (H) 4.6 - 6.5 %    Comment: Glycemic Control Guidelines for People with Diabetes:Non Diabetic:  <6%Goal of Therapy: <7%Additional Action Suggested:  >8%   PSA     Status: None   Collection Time: 06/05/15  8:30 AM  Result Value Ref Range   PSA 1.09 0.10 - 4.00 ng/mL   Assessment/Plan: 1. Acute bacterial sinusitis Will extend Augmentin for 5 more days to ensure complete resolution since he responded well thus far to Augmentin. He is to see dentist regarding cracked teeth and need for removal.   - amoxicillin-clavulanate (AUGMENTIN) 875-125 MG tablet; Take 1 tablet by mouth 2 (two) times daily.  Dispense: 10 tablet; Refill: 0

## 2015-06-18 NOTE — Patient Instructions (Signed)
Please complete 5 more days of Augmentin. Tylenol for pain. You need to schedule an appointment with the dentist to further assess dental concerns. I feel you have some teeth needing removal.   If symptoms recur, please call me

## 2015-06-18 NOTE — Progress Notes (Signed)
Pre visit review using our clinic review tool, if applicable. No additional management support is needed unless otherwise documented below in the visit note. 

## 2015-07-13 ENCOUNTER — Telehealth: Payer: Self-pay | Admitting: Physician Assistant

## 2015-07-13 DIAGNOSIS — E1149 Type 2 diabetes mellitus with other diabetic neurological complication: Secondary | ICD-10-CM

## 2015-07-13 MED ORDER — PREGABALIN 100 MG PO CAPS: 100.0000 mg | ORAL_CAPSULE | Freq: Two times a day (BID) | ORAL | Status: DC

## 2015-07-13 NOTE — Telephone Encounter (Signed)
Ok to send in refill

## 2015-07-13 NOTE — Telephone Encounter (Signed)
Relationship to patient: self Can be reached: [phone] Pharmacy: [id number] - [place], [place] - [address]   Reason for call: Pt needing lyrica [place] pharmacy [place]. He has 2 left and needs sent in. He said he called this morning but I do not see notes. He thinks it was answering service but they said they would relay the message.

## 2015-07-13 NOTE — Telephone Encounter (Signed)
Rx faxed to pharmacy/SLS 06/16

## 2015-07-13 NOTE — Telephone Encounter (Signed)
Please advise is drug can be px out of state/SLS 06/16 Per [facility], 06/04/15 Rx #60x1, was filled on 06/07/15 only

## 2015-07-29 ENCOUNTER — Other Ambulatory Visit: Payer: Self-pay | Admitting: Physician Assistant

## 2015-07-29 DIAGNOSIS — E1149 Type 2 diabetes mellitus with other diabetic neurological complication: Secondary | ICD-10-CM

## 2015-07-30 NOTE — Telephone Encounter (Signed)
Last Rx 07/13/15, #60x0; Too Soon for Request, will Hold until patient is due for refill/SLS 07/03  Medication Detail      Disp Refills Start End     pregabalin (LYRICA) 100 MG capsule 60 capsule 0 07/13/2015     Sig - Route: Take 1 capsule (100 mg total) by mouth 2 (two) times daily. - Oral    Class: Print     Associated Diagnoses    Other diabetic neurological complication associated with type 2 diabetes mellitus ([place]) - Primary       Pharmacy    [facility] - [place], [place] - [address]

## 2015-08-03 MED ORDER — PREGABALIN 100 MG PO CAPS: 100.0000 mg | ORAL_CAPSULE | Freq: Two times a day (BID) | ORAL | Status: DC

## 2015-08-06 NOTE — Telephone Encounter (Signed)
Rx faxed to pharmacy/SLS 07/10

## 2015-08-16 ENCOUNTER — Other Ambulatory Visit: Payer: Self-pay | Admitting: Physician Assistant

## 2015-11-26 ENCOUNTER — Other Ambulatory Visit: Payer: Self-pay | Admitting: Physician Assistant

## 2015-11-26 ENCOUNTER — Telehealth: Payer: Self-pay | Admitting: *Deleted

## 2015-11-26 DIAGNOSIS — N401 Enlarged prostate with lower urinary tract symptoms: Secondary | ICD-10-CM

## 2015-11-26 DIAGNOSIS — IMO0002 Reserved for concepts with insufficient information to code with codable children: Secondary | ICD-10-CM

## 2015-11-26 DIAGNOSIS — E1165 Type 2 diabetes mellitus with hyperglycemia: Secondary | ICD-10-CM

## 2015-11-26 MED ORDER — GLIMEPIRIDE 2 MG PO TABS: 2.0000 mg | ORAL_TABLET | Freq: Every day | ORAL | 0 refills | Status: DC

## 2015-11-26 MED ORDER — TAMSULOSIN HCL 0.4 MG PO CAPS: 0.8000 mg | ORAL_CAPSULE | Freq: Every day | ORAL | 0 refills | Status: DC

## 2015-11-26 NOTE — Telephone Encounter (Signed)
Faxed refill request received from [facility] for Tamsulosin  Last filled by MD on 06/04/15, #60x3 Last AEX - 06/04/15 Next AEX - 1-Mth Requested drug refills are authorized, however, the patient needs further evaluation and/or laboratory testing before further refills are given. Ask him to make an appointment for this/SLS 10/30

## 2015-11-27 NOTE — Telephone Encounter (Signed)
Patient is in [place] and will not be back until mid December. States he will call Casey Reyes at the [place] office when he returns.

## 2016-01-29 ENCOUNTER — Ambulatory Visit: Payer: Managed Care, Other (non HMO) | Admitting: Physician Assistant

## 2016-02-08 ENCOUNTER — Ambulatory Visit (INDEPENDENT_AMBULATORY_CARE_PROVIDER_SITE_OTHER): Payer: Managed Care, Other (non HMO) | Admitting: Physician Assistant

## 2016-02-08 ENCOUNTER — Encounter: Payer: Self-pay | Admitting: Physician Assistant

## 2016-02-08 VITALS — BP 138/70 | HR 80 | Temp 98.3°F | Resp 16 | Ht 67.0 in | Wt 245.0 lb

## 2016-02-08 DIAGNOSIS — Z23 Encounter for immunization: Secondary | ICD-10-CM | POA: Diagnosis not present

## 2016-02-08 DIAGNOSIS — R748 Abnormal levels of other serum enzymes: Secondary | ICD-10-CM

## 2016-02-08 DIAGNOSIS — E1165 Type 2 diabetes mellitus with hyperglycemia: Secondary | ICD-10-CM | POA: Diagnosis not present

## 2016-02-08 DIAGNOSIS — I10 Essential (primary) hypertension: Secondary | ICD-10-CM

## 2016-02-08 DIAGNOSIS — Z125 Encounter for screening for malignant neoplasm of prostate: Secondary | ICD-10-CM | POA: Diagnosis not present

## 2016-02-08 DIAGNOSIS — E1142 Type 2 diabetes mellitus with diabetic polyneuropathy: Secondary | ICD-10-CM | POA: Diagnosis not present

## 2016-02-08 DIAGNOSIS — E1149 Type 2 diabetes mellitus with other diabetic neurological complication: Secondary | ICD-10-CM

## 2016-02-08 DIAGNOSIS — IMO0002 Reserved for concepts with insufficient information to code with codable children: Secondary | ICD-10-CM

## 2016-02-08 DIAGNOSIS — N401 Enlarged prostate with lower urinary tract symptoms: Secondary | ICD-10-CM

## 2016-02-08 DIAGNOSIS — R351 Nocturia: Secondary | ICD-10-CM

## 2016-02-08 DIAGNOSIS — B182 Chronic viral hepatitis C: Secondary | ICD-10-CM

## 2016-02-08 LAB — URINALYSIS, ROUTINE W REFLEX MICROSCOPIC
BILIRUBIN URINE: NEGATIVE
HGB URINE DIPSTICK: NEGATIVE
KETONES UR: NEGATIVE
LEUKOCYTES UA: NEGATIVE
NITRITE: NEGATIVE
RBC / HPF: NONE SEEN (ref 0–?)
SPECIFIC GRAVITY, URINE: 1.02 (ref 1.000–1.030)
Total Protein, Urine: NEGATIVE
UROBILINOGEN UA: 0.2 (ref 0.0–1.0)
WBC UA: NONE SEEN (ref 0–?)
pH: 6 (ref 5.0–8.0)

## 2016-02-08 LAB — COMPREHENSIVE METABOLIC PANEL
ALT: 70 U/L — ABNORMAL HIGH (ref 0–53)
AST: 112 U/L — ABNORMAL HIGH (ref 0–37)
Albumin: 3.9 g/dL (ref 3.5–5.2)
Alkaline Phosphatase: 123 U/L — ABNORMAL HIGH (ref 39–117)
BILIRUBIN TOTAL: 0.6 mg/dL (ref 0.2–1.2)
BUN: 11 mg/dL (ref 6–23)
CALCIUM: 9.7 mg/dL (ref 8.4–10.5)
CO2: 27 mEq/L (ref 19–32)
Chloride: 101 mEq/L (ref 96–112)
Creatinine, Ser: 0.8 mg/dL (ref 0.40–1.50)
GFR: 126.41 mL/min (ref >60.00)
GLUCOSE: 276 mg/dL — AB (ref 70–99)
POTASSIUM: 3.9 meq/L (ref 3.5–5.1)
Sodium: 137 mEq/L (ref 135–145)
TOTAL PROTEIN: 6.9 g/dL (ref 6.0–8.3)

## 2016-02-08 LAB — CBC
HCT: 45.5 % (ref 39.0–52.0)
HEMOGLOBIN: 15.2 g/dL (ref 13.0–17.0)
MCHC: 33.3 g/dL (ref 30.0–36.0)
MCV: 86.6 fl (ref 78.0–100.0)
PLATELETS: 107 10*3/uL — AB (ref 150.0–400.0)
RBC: 5.26 Mil/uL (ref 4.22–5.81)
RDW: 14.1 % (ref 11.5–15.5)
WBC: 2.4 10*3/uL — ABNORMAL LOW (ref 4.0–10.5)

## 2016-02-08 LAB — LIPID PANEL
CHOL/HDL RATIO: 8
CHOLESTEROL: 184 mg/dL (ref 0–200)
HDL: 21.8 mg/dL — ABNORMAL LOW (ref >39.00)
NONHDL: 161.77
Triglycerides: 238 mg/dL — ABNORMAL HIGH (ref 0.0–149.0)
VLDL: 47.6 mg/dL — ABNORMAL HIGH (ref 0.0–40.0)

## 2016-02-08 LAB — PSA: PSA: 1.58 ng/mL (ref 0.10–4.00)

## 2016-02-08 LAB — LDL CHOLESTEROL, DIRECT: LDL DIRECT: 123 mg/dL

## 2016-02-08 LAB — HEMOGLOBIN A1C: Hgb A1c MFr Bld: 10.6 % — ABNORMAL HIGH (ref 4.6–6.5)

## 2016-02-08 LAB — GLUCOSE, POCT (MANUAL RESULT ENTRY): POC Glucose: 304 mg/dl — AB (ref 70–99)

## 2016-02-08 MED ORDER — TAMSULOSIN HCL 0.4 MG PO CAPS: 0.8000 mg | ORAL_CAPSULE | Freq: Every day | ORAL | 1 refills | Status: DC

## 2016-02-08 MED ORDER — LOSARTAN POTASSIUM 50 MG PO TABS: 50.0000 mg | ORAL_TABLET | Freq: Every day | ORAL | 0 refills | Status: DC

## 2016-02-08 MED ORDER — GLIMEPIRIDE 2 MG PO TABS: 2.0000 mg | ORAL_TABLET | Freq: Every day | ORAL | 1 refills | Status: DC

## 2016-02-08 MED ORDER — PREGABALIN 100 MG PO CAPS: 100.0000 mg | ORAL_CAPSULE | Freq: Two times a day (BID) | ORAL | 1 refills | Status: DC

## 2016-02-08 NOTE — Progress Notes (Signed)
Patient presents to clinic today for follow-up of chronic medical issues. Patient with history of Hypertension, Diabetes Mellitus II uncontrolled with neuropathy and Chronic Hepatitis C. Significant history of non-compliance with medications and instructions regarding chronic health issues.  Hypertension -- Currently on losartan 50 mg daily. Endorses he is taking daily as directed. Almost out of medication. Patient denies chest pain, palpitations, lightheadedness, dizziness, vision changes or frequent headaches.  BP Readings from Last 3 Encounters:  02/08/16 138/70  06/18/15 120/80  06/11/15 138/88   DM II, uncontrolled with peripheral neuropathy -- Currently prescribed a regimen of Invokana 100 mg daily and Amaryl 2 mg daily. Previously also on Metformin which lead to ARF so was stopped. Out of medications for several months. Has not checked sugars regularly.  Last time he checked sugars was over a week ago and in the high 200s. Endorses poor diet overall -- fast food, gravy. Some fruit and salad. Occasional fried chicken. Is not exercising as recommended. Notes significant numbness and tingling in feet bilaterally. Denies foot wound or injury. Was on Lyrica with improvement in symptoms but has been out for several months.   Hepatitis C -- First diagnosed after noting elevated liver enzymes in 2015. Repeat enzymes checked with Hep panel that was + for Hep C. Quantitative Hep C RNA added to further assess and positive. Patient was referred to Hepatology in 06/2013. Patient refused to schedule appointment with specialist initially noting he was "out of town". At repeat visit in December that year another referral was placed after discussion of importance of getting Hep C treated due to risk to liver and health in the future. Patient also encouraged to stop all alcohol consumption which he refused to do. Referral noted multiple attempts to contact patient without successfully scheduling appointment.  Finally gave patient number for specialist to call and set up appointment. Initially states he did but on discussion today states he never followed up with them despite telling us several times that he was seeing the specialist. At present, patient is still using tylenol-containing products and drinking about 1 pint of liquor per week. Is also smoking. Denies readiness to quit.   Patient endorses pain in R lower back off and on x 3-4 months described as aching in nature. Endorses noting occasional sharp pain radiating into lower extremities. Endorses episode of lower extremity weakness a few weeks ago. Denies recent trauma or injury. Has never had imaging of the lower back. .     Past Medical History:  Diagnosis Date  . Chicken pox   . Diabetes mellitus without complication ([place])   . Hepatitis C   . Hypertension     Current Outpatient Prescriptions on File Prior to Visit  Medication Sig Dispense Refill  . fluticasone (FLONASE) 50 MCG/ACT nasal spray USE 2 SPRAYS IN EACH NOSTRIL QD FOR SINUS CONGESTION 16 g 0  . glucose blood (ONETOUCH VERIO) test strip Onetouch Verio. Check Blood sugar once day. Dx: E119 100 each 12  . ibuprofen (ADVIL,MOTRIN) 200 MG tablet Take 200 mg by mouth every 6 (six) hours as needed. Reported on 06/04/2015    . loratadine (CLARITIN) 10 MG tablet Take 1 tablet (10 mg total) by mouth daily as needed for allergies. 30 tablet 5   No current facility-administered medications on file prior to visit.     No Known Allergies  Family History  Problem Relation Age of Onset  . Heart attack Father     Deceased  . Heart disease Father   .  Diabetes Mother     Living  . Heart attack Brother 3    Deceased  . Sudden death Brother   . Thyroid disease Sister   . Diabetes Sister   . Healthy Sister     x1  . Colon cancer Brother     x1  . Heart disease Maternal Grandfather   . Allergies Daughter   . Diabetes Son   . Diabetes Maternal Grandmother     Social History    Social History  . Marital status: Divorced    Spouse name: N/A  . Number of children: N/A  . Years of education: N/A   Social History Main Topics  . Smoking status: Current Some Day Smoker    Packs/day: 0.25    Types: Cigarettes  . Smokeless tobacco: Never Used  . Alcohol use Yes  . Drug use: No  . Sexual activity: Not Asked   Other Topics Concern  . None   Social History Narrative  . None   Review of Systems - See HPI.  All other ROS are negative.  BP 138/70   Pulse 80   Temp 98.3 F (36.8 C) (Oral)   Resp 16   Ht 5\' 7"  ([id number] m)   Wt 245 lb (111.1 kg)   SpO2 96%   BMI 38.37 kg/m   Physical Exam  Constitutional: He is oriented to person, place, and time and well-developed, well-nourished, and in no distress.  HENT:  Head: Normocephalic and atraumatic.  Eyes: Conjunctivae are normal.  Neck: Neck supple.  Cardiovascular: Normal rate, regular rhythm, normal heart sounds and intact distal pulses.   Pulmonary/Chest: Effort normal and breath sounds normal. No respiratory distress. He has no wheezes. He has no rales. He exhibits no tenderness.  Abdominal: Soft. Bowel sounds are normal. He exhibits no distension.  Musculoskeletal: Normal range of motion. He exhibits no tenderness.       Thoracic back: Normal.       Lumbar back: Normal.  Neurological: He is alert and oriented to person, place, and time.  Skin: Skin is warm and dry. No rash noted.  Vitals reviewed.  Assessment/Plan: Essential hypertension BP stable. Asymptomatic. Labs today. Will continue losartan 50 mg daily.   Hepatitis, viral Chronic Hep C. Long discussion with patient again about long-term risks of untreated Hep C. Patient also still with alcohol consumption and smoking. Cessation recommended. Patient not fully ready to commit to this. Patient and wife to discuss in depth and re-visit at FU in 2 weeks. Urgent referral placed back to Hepatology. Will repeat LFT and Hep C RNA to have updated labs  for specialist. Discussed with patient that he is to be compliant with this referral. This will be the last referral for Hep C that I will place. If continuing to be non-compliant with this and other chronic issues, I will no longer be able to care for him. Will not supervise his neglect of his own health.   Diabetes mellitus type II, uncontrolled With neuropathy.  Patient to schedule repeat eye examination.  Out of all medications. Will restart his Amaryl first. Check full lab panel today. Add on further medications based on liver and renal function.  Resume checking fasting sugars and record. Foot exam updated today -- long nails but no objective numbness with monofilament testing.   Diabetic neuropathy Labs today. Restart Lyrica at previous dose. FU 2 weeks.   BPH associated with nocturia PSA today. Restart Flomax. FU 2 weeks.   Prostate cancer screening  The natural history of prostate cancer and ongoing controversy regarding screening and potential treatment outcomes of prostate cancer has been discussed with the patient. The meaning of a false positive PSA and a false negative PSA has been discussed. He indicates understanding of the limitations of this screening test and wishes  to proceed with screening PSA testing.     Leeanne Rio, PA-C

## 2016-02-08 NOTE — Patient Instructions (Addendum)
Please go to the lab for blood work.  Follow-up with your eye doctor as scheduled.  You will receive a call from a new liver specialist, a podiatrist (foot doctor) and a diabetic nutritionist. If you do not get a call from each by next Tuesday, give me a call.  We have been under the impression that you have been seeing a Liver specialist per multiple referrals made and per previous conversations with you. It is very important that we get your liver checked out further and get the Hep C treated.  Continue working on complete cessation of alcohol. No tylenol-containing products.  Restart the Glimeperide, Flomax, Losartan and Lyrica as directed.  We will add on additional medications based on labs.   You will need to follow-up with me in 2 weeks.  I need you to be compliant with follow-up so that we can take good care of you.    Sciatica Rehab Ask your health care provider which exercises are safe for you. Do exercises exactly as told by your health care provider and adjust them as directed. It is normal to feel mild stretching, pulling, tightness, or discomfort as you do these exercises, but you should stop right away if you feel sudden pain or your pain gets worse.Do not begin these exercises until told by your health care provider. Stretching and range of motion exercises These exercises warm up your muscles and joints and improve the movement and flexibility of your hips and your back. These exercises also help to relieve pain, numbness, and tingling. Exercise A: Sciatic nerve glide 1. Sit in a chair with your head facing down toward your chest. Place your hands behind your back. Let your shoulders slump forward. 2. Slowly straighten one of your knees while you tilt your head back as if you are looking toward the ceiling. Only straighten your leg as far as you can without making your symptoms worse. 3. Hold for __________ seconds. 4. Slowly return to the starting position. 5. Repeat with  your other leg. Repeat __________ times. Complete this exercise __________ times a day. Exercise B: Knee to chest with hip adduction and internal rotation 1. Lie on your back on a firm surface with both legs straight. 2. Bend one of your knees and move it up toward your chest until you feel a gentle stretch in your lower back and buttock. Then, move your knee toward the shoulder that is on the opposite side from your leg.  Hold your leg in this position by holding onto the front of your knee. 3. Hold for __________ seconds. 4. Slowly return to the starting position. 5. Repeat with your other leg. Repeat __________ times. Complete this exercise __________ times a day. Exercise C: Prone extension on elbows 1. Lie on your abdomen on a firm surface. A bed may be too soft for this exercise. 2. Prop yourself up on your elbows. 3. Use your arms to help lift your chest up until you feel a gentle stretch in your abdomen and your lower back.  This will place some of your body weight on your elbows. If this is uncomfortable, try stacking pillows under your chest.  Your hips should stay down, against the surface that you are lying on. Keep your hip and back muscles relaxed. 4. Hold for __________ seconds. 5. Slowly relax your upper body and return to the starting position. Repeat __________ times. Complete this exercise __________ times a day. Strengthening exercises These exercises build strength and endurance in your  back. Endurance is the ability to use your muscles for a long time, even after they get tired. Exercise D: Pelvic tilt 1. Lie on your back on a firm surface. Bend your knees and keep your feet flat. 2. Tense your abdominal muscles. Tip your pelvis up toward the ceiling and flatten your lower back into the floor.  To help with this exercise, you may place a small towel under your lower back and try to push your back into the towel. 3. Hold for __________ seconds. 4. Let your muscles  relax completely before you repeat this exercise. Repeat __________ times. Complete this exercise __________ times a day. Exercise E: Alternating arm and leg raises 1. Get on your hands and knees on a firm surface. If you are on a hard floor, you may want to use padding to cushion your knees, such as an exercise mat. 2. Line up your arms and legs. Your hands should be below your shoulders, and your knees should be below your hips. 3. Lift your left leg behind you. At the same time, raise your right arm and straighten it in front of you.  Do not lift your leg higher than your hip.  Do not lift your arm higher than your shoulder.  Keep your abdominal and back muscles tight.  Keep your hips facing the ground.  Do not arch your back.  Keep your balance carefully, and do not hold your breath. 4. Hold for __________ seconds. 5. Slowly return to the starting position and repeat with your right leg and your left arm. Repeat __________ times. Complete this exercise __________ times a day. Posture and body mechanics   Body mechanics refers to the movements and positions of your body while you do your daily activities. Posture is part of body mechanics. Good posture and healthy body mechanics can help to relieve stress in your body's tissues and joints. Good posture means that your spine is in its natural S-curve position (your spine is neutral), your shoulders are pulled back slightly, and your head is not tipped forward. The following are general guidelines for applying improved posture and body mechanics to your everyday activities. Standing   When standing, keep your spine neutral and your feet about hip-width apart. Keep a slight bend in your knees. Your ears, shoulders, and hips should line up.  When you do a task in which you stand in one place for a long time, place one foot up on a stable object that is 2-4 inches (5-10 cm) high, such as a footstool. This helps keep your spine  neutral. Sitting  When sitting, keep your spine neutral and keep your feet flat on the floor. Use a footrest, if necessary, and keep your thighs parallel to the floor. Avoid rounding your shoulders, and avoid tilting your head forward.  When working at a desk or a computer, keep your desk at a height where your hands are slightly lower than your elbows. Slide your chair under your desk so you are close enough to maintain good posture.  When working at a computer, place your monitor at a height where you are looking straight ahead and you do not have to tilt your head forward or downward to look at the screen. Resting   When lying down and resting, avoid positions that are most painful for you.  If you have pain with activities such as sitting, bending, stooping, or squatting (flexion-based activities), lie in a position in which your body does not bend very  much. For example, avoid curling up on your side with your arms and knees near your chest (fetal position).  If you have pain with activities such as standing for a long time or reaching with your arms (extension-based activities), lie with your spine in a neutral position and bend your knees slightly. Try the following positions:  Lying on your side with a pillow between your knees.  Lying on your back with a pillow under your knees. Lifting   When lifting objects, keep your feet at least shoulder-width apart and tighten your abdominal muscles.  Bend your knees and hips and keep your spine neutral. It is important to lift using the strength of your legs, not your back. Do not lock your knees straight out.  Always ask for help to lift heavy or awkward objects. This information is not intended to replace advice given to you by your health care provider. Make sure you discuss any questions you have with your health care provider. Document Released: 01/13/2005 Document Revised: 09/20/2015 Document Reviewed: 09/29/2014 Elsevier  Interactive Patient Education  2017 [employer].

## 2016-02-08 NOTE — Progress Notes (Signed)
Pre visit review using our clinic review tool, if applicable. No additional management support is needed unless otherwise documented below in the visit note. 

## 2016-02-12 LAB — HEPATITIS C RNA QUANTITATIVE
HCV QUANT LOG: 6.7 {Log} — AB (ref <1.18)
HCV Quantitative: 5033001 IU/mL — ABNORMAL HIGH (ref <15)

## 2016-02-14 DIAGNOSIS — R351 Nocturia: Secondary | ICD-10-CM

## 2016-02-14 DIAGNOSIS — R748 Abnormal levels of other serum enzymes: Secondary | ICD-10-CM | POA: Insufficient documentation

## 2016-02-14 DIAGNOSIS — N401 Enlarged prostate with lower urinary tract symptoms: Secondary | ICD-10-CM | POA: Insufficient documentation

## 2016-02-14 NOTE — Assessment & Plan Note (Signed)
The natural history of prostate cancer and ongoing controversy regarding screening and potential treatment outcomes of prostate cancer has been discussed with the patient. The meaning of a false positive PSA and a false negative PSA has been discussed. He indicates understanding of the limitations of this screening test and wishes  to proceed with screening PSA testing.  

## 2016-02-14 NOTE — Assessment & Plan Note (Signed)
PSA today. Restart Flomax. FU 2 weeks.

## 2016-02-14 NOTE — Assessment & Plan Note (Signed)
Labs today. Restart Lyrica at previous dose. FU 2 weeks.

## 2016-02-14 NOTE — Assessment & Plan Note (Signed)
Chronic Hep C. Long discussion with patient again about long-term risks of untreated Hep C. Patient also still with alcohol consumption and smoking. Cessation recommended. Patient not fully ready to commit to this. Patient and wife to discuss in depth and re-visit at FU in 2 weeks. Urgent referral placed back to Hepatology. Will repeat LFT and Hep C RNA to have updated labs for specialist. Discussed with patient that he is to be compliant with this referral. This will be the last referral for Hep C that I will place. If continuing to be non-compliant with this and other chronic issues, I will no longer be able to care for him. Will not supervise his neglect of his own health.

## 2016-02-14 NOTE — Assessment & Plan Note (Signed)
BP stable. Asymptomatic. Labs today. Will continue losartan 50 mg daily.

## 2016-02-14 NOTE — Assessment & Plan Note (Signed)
With neuropathy.  Patient to schedule repeat eye examination.  Out of all medications. Will restart his Amaryl first. Check full lab panel today. Add on further medications based on liver and renal function.  Resume checking fasting sugars and record. Foot exam updated today -- long nails but no objective numbness with monofilament testing.

## 2016-02-15 ENCOUNTER — Other Ambulatory Visit: Payer: Self-pay | Admitting: Emergency Medicine

## 2016-02-15 MED ORDER — EMPAGLIFLOZIN 10 MG PO TABS: 10.0000 mg | ORAL_TABLET | Freq: Every day | ORAL | 1 refills | Status: DC

## 2016-02-21 ENCOUNTER — Telehealth: Payer: Self-pay | Admitting: Physician Assistant

## 2016-02-21 ENCOUNTER — Other Ambulatory Visit: Payer: Self-pay | Admitting: Physician Assistant

## 2016-02-21 DIAGNOSIS — E119 Type 2 diabetes mellitus without complications: Secondary | ICD-10-CM

## 2016-02-21 NOTE — Telephone Encounter (Signed)
(  Voicemail message retrieved from phone at front desk, message left on 02/21/16 @ 8:10am)  Patient states PCP put in referral for him to see a foot doctor.  However, he has not heard anything back regarding an appt.  Pt is requesting a call back with status of appt.    After reviewing pt's chart, I did not see a referral to podiatry.  Please advise if there should be one.

## 2016-02-21 NOTE — Telephone Encounter (Signed)
Appointment scheduled for Podiatry for 02/26/16.

## 2016-02-21 NOTE — Telephone Encounter (Signed)
Referral placed for Podiatry for diabetic foot exam.

## 2016-02-22 ENCOUNTER — Ambulatory Visit (INDEPENDENT_AMBULATORY_CARE_PROVIDER_SITE_OTHER): Payer: Managed Care, Other (non HMO) | Admitting: Physician Assistant

## 2016-02-22 ENCOUNTER — Encounter: Payer: Self-pay | Admitting: Physician Assistant

## 2016-02-22 VITALS — BP 138/84 | HR 86 | Temp 98.3°F | Resp 16 | Ht 67.0 in | Wt 240.0 lb

## 2016-02-22 DIAGNOSIS — B1920 Unspecified viral hepatitis C without hepatic coma: Secondary | ICD-10-CM

## 2016-02-22 DIAGNOSIS — K640 First degree hemorrhoids: Secondary | ICD-10-CM | POA: Diagnosis not present

## 2016-02-22 DIAGNOSIS — K602 Anal fissure, unspecified: Secondary | ICD-10-CM

## 2016-02-22 DIAGNOSIS — E1165 Type 2 diabetes mellitus with hyperglycemia: Secondary | ICD-10-CM

## 2016-02-22 DIAGNOSIS — IMO0002 Reserved for concepts with insufficient information to code with codable children: Secondary | ICD-10-CM

## 2016-02-22 DIAGNOSIS — E1142 Type 2 diabetes mellitus with diabetic polyneuropathy: Secondary | ICD-10-CM

## 2016-02-22 MED ORDER — HYDROCORTISONE ACE-PRAMOXINE 1-1 % RE FOAM: 1.0000 | Freq: Two times a day (BID) | RECTAL | 0 refills | Status: DC

## 2016-02-22 NOTE — Progress Notes (Signed)
Pre visit review using our clinic review tool, if applicable. No additional management support is needed unless otherwise documented below in the visit note. 

## 2016-02-22 NOTE — Patient Instructions (Addendum)
Please start the diabetes medications as directed. Check fasting sugars as directed and record.  Call me with your results over the next week.  Please stay well hydrated and increase fiber supplement. Take a stool softener like Colace to help make it easier to pass bowel movements.  Start soaking the area in warm water 1-2 x day and after bowel movement to help clean the area, alleviate pain and promote healing.  I will give you a prescription to use as directed to help with hemorrhoid. Use as directed.

## 2016-02-22 NOTE — Progress Notes (Signed)
Patient presents to clinic today for 2-week follow-up of Hypertension and DM II after being placed on Jardiance and restarting his Glimiperide and Lyrica. Patient was given prescription vouchers at last visit. Patient states he has lost the vouchers and as such has not started medication. Is not checking fasting sugars as directed. Did see his eye doctor and is having notes faxed to our office. Says he was told his eye look good. Has heard from Hepatology and has appointment scheduled next Monday regarding Hep C. Has stopped all alcohol consumption since last appointment and notes he feels better.   Patient also notes 2 days of pain and pressure with bowel movement. Denies constipation or diarrhea. Denies fever, chills, malaise or fatigue. Endorses some bright red blood on toilet paper. Hurts with wiping.    Past Medical History:  Diagnosis Date  . Chicken pox   . Diabetes mellitus without complication ([place])   . Hepatitis C   . Hypertension     Current Outpatient Prescriptions on File Prior to Visit  Medication Sig Dispense Refill  . fluticasone (FLONASE) 50 MCG/ACT nasal spray USE 2 SPRAYS IN EACH NOSTRIL QD FOR SINUS CONGESTION 16 g 0  . glimepiride (AMARYL) 2 MG tablet Take 1 tablet (2 mg total) by mouth daily before breakfast. 30 tablet 1  . glucose blood (ONETOUCH VERIO) test strip Onetouch Verio. Check Blood sugar once day. Dx: E119 100 each 12  . ibuprofen (ADVIL,MOTRIN) 200 MG tablet Take 200 mg by mouth every 6 (six) hours as needed. Reported on 06/04/2015    . loratadine (CLARITIN) 10 MG tablet Take 1 tablet (10 mg total) by mouth daily as needed for allergies. 30 tablet 5  . losartan (COZAAR) 50 MG tablet Take 1 tablet (50 mg total) by mouth daily. 90 tablet 0  . tamsulosin (FLOMAX) 0.4 MG CAPS capsule Take 2 capsules (0.8 mg total) by mouth daily. 60 capsule 1  . empagliflozin (JARDIANCE) 10 MG TABS tablet Take 10 mg by mouth daily. (Patient not taking: Reported on 02/22/2016) 30  tablet 1  . pregabalin (LYRICA) 100 MG capsule Take 1 capsule (100 mg total) by mouth 2 (two) times daily. (Patient not taking: Reported on 02/22/2016) 60 capsule 1   No current facility-administered medications on file prior to visit.     No Known Allergies  Family History  Problem Relation Age of Onset  . Heart attack Father     Deceased  . Heart disease Father   . Diabetes Mother     Living  . Heart attack Brother 57    Deceased  . Sudden death Brother   . Thyroid disease Sister   . Diabetes Sister   . Healthy Sister     x1  . Colon cancer Brother     x1  . Heart disease Maternal Grandfather   . Allergies Daughter   . Diabetes Son   . Diabetes Maternal Grandmother     Social History   Social History  . Marital status: Divorced    Spouse name: N/A  . Number of children: N/A  . Years of education: N/A   Social History Main Topics  . Smoking status: Current Some Day Smoker    Packs/day: 0.25    Types: Cigarettes  . Smokeless tobacco: Never Used  . Alcohol use Yes  . Drug use: No  . Sexual activity: Not Asked   Other Topics Concern  . None   Social History Narrative  . None  Review of Systems - See HPI.  All other ROS are negative.  BP 138/84   Pulse 86   Temp 98.3 F (36.8 C) (Oral)   Resp 16   Ht 5' 7" (1.702 m)   Wt 240 lb (108.9 kg)   SpO2 98%   BMI 37.59 kg/m   Physical Exam  Constitutional: He is oriented to person, place, and time and well-developed, well-nourished, and in no distress.  HENT:  Head: Normocephalic and atraumatic.  Eyes: Conjunctivae are normal.  Neck: Neck supple.  Cardiovascular: Normal rate, regular rhythm, normal heart sounds and intact distal pulses.   Pulmonary/Chest: Effort normal and breath sounds normal. No respiratory distress. He has no wheezes. He has no rales. He exhibits no tenderness.  Genitourinary: Rectal exam shows internal hemorrhoid and fissure. Rectal exam shows no mass. Prostate is enlarged.  Prostate is not tender.  Neurological: He is alert and oriented to person, place, and time. A cranial nerve deficit is present.  Skin: Skin is warm and dry. No rash noted.  Psychiatric: Affect normal.  Vitals reviewed.  Recent Results (from the past 2160 hour(s))  Comp Met (CMET)     Status: Abnormal   Collection Time: 02/08/16 12:31 PM  Result Value Ref Range   Sodium 137 135 - 145 mEq/L   Potassium 3.9 3.5 - 5.1 mEq/L   Chloride 101 96 - 112 mEq/L   CO2 27 19 - 32 mEq/L   Glucose, Bld 276 (H) 70 - 99 mg/dL   BUN 11 6 - 23 mg/dL   Creatinine, Ser 0.80 0.40 - 1.50 mg/dL   Total Bilirubin 0.6 0.2 - 1.2 mg/dL   Alkaline Phosphatase 123 (H) 39 - 117 U/L   AST 112 (H) 0 - 37 U/L   ALT 70 (H) 0 - 53 U/L   Total Protein 6.9 6.0 - 8.3 g/dL   Albumin 3.9 3.5 - 5.2 g/dL   Calcium 9.7 8.4 - 10.5 mg/dL   GFR 126.41 >60.00 mL/min  CBC     Status: Abnormal   Collection Time: 02/08/16 12:31 PM  Result Value Ref Range   WBC 2.4 Repeated and verified X2. (L) 4.0 - 10.5 K/uL   RBC 5.26 4.22 - 5.81 Mil/uL   Platelets 107.0 (L) 150.0 - 400.0 K/uL   Hemoglobin 15.2 13.0 - 17.0 g/dL   HCT 45.5 39.0 - 52.0 %   MCV 86.6 78.0 - 100.0 fl   MCHC 33.3 30.0 - 36.0 g/dL   RDW 14.1 11.5 - 15.5 %  Hemoglobin A1c     Status: Abnormal   Collection Time: 02/08/16 12:31 PM  Result Value Ref Range   Hgb A1c MFr Bld 10.6 (H) 4.6 - 6.5 %    Comment: Glycemic Control Guidelines for People with Diabetes:Non Diabetic:  <6%Goal of Therapy: <7%Additional Action Suggested:  >8%   Urinalysis, Routine w reflex microscopic     Status: Abnormal   Collection Time: 02/08/16 12:31 PM  Result Value Ref Range   Color, Urine YELLOW Yellow;Lt. Yellow   APPearance CLEAR Clear   Specific Gravity, Urine 1.020 1.000 - 1.030   pH 6.0 5.0 - 8.0   Total Protein, Urine NEGATIVE Negative   Urine Glucose >=1000 (A) Negative   Ketones, ur NEGATIVE Negative   Bilirubin Urine NEGATIVE Negative   Hgb urine dipstick NEGATIVE Negative    Urobilinogen, UA 0.2 0.0 - 1.0   Leukocytes, UA NEGATIVE Negative   Nitrite NEGATIVE Negative   WBC, UA none seen 0-2/hpf  RBC / HPF none seen 0-2/hpf   Squamous Epithelial / LPF Rare(0-4/hpf) Rare(0-4/hpf)  PSA     Status: None   Collection Time: 02/08/16 12:31 PM  Result Value Ref Range   PSA 1.58 0.10 - 4.00 ng/mL  Lipid panel     Status: Abnormal   Collection Time: 02/08/16 12:31 PM  Result Value Ref Range   Cholesterol 184 0 - 200 mg/dL    Comment: ATP III Classification       Desirable:  < 200 mg/dL               Borderline High:  200 - 239 mg/dL          High:  > = 240 mg/dL   Triglycerides 238.0 (H) 0.0 - 149.0 mg/dL    Comment: Normal:  <150 mg/dLBorderline High:  150 - 199 mg/dL   HDL 21.80 (L) >39.00 mg/dL   VLDL 47.6 (H) 0.0 - 40.0 mg/dL   Total CHOL/HDL Ratio 8     Comment:                Men          Women1/2 Average Risk     3.4          3.3Average Risk          5.0          4.42X Average Risk          9.6          7.13X Average Risk          15.0          11.0                       NonHDL 161.77     Comment: NOTE:  Non-HDL goal should be 30 mg/dL higher than patient's LDL goal (i.e. LDL goal of < 70 mg/dL, would have non-HDL goal of < 100 mg/dL)  Hepatitis C RNA quantitative     Status: Abnormal   Collection Time: 02/08/16 12:31 PM  Result Value Ref Range   HCV Quantitative 5,033,001 (H) <15 IU/mL   HCV Quantitative Log 6.70 (H) <1.18 log 10  LDL cholesterol, direct     Status: None   Collection Time: 02/08/16 12:31 PM  Result Value Ref Range   Direct LDL 123.0 mg/dL    Comment: Optimal:  <100 mg/dLNear or Above Optimal:  100-129 mg/dLBorderline High:  130-159 mg/dLHigh:  160-189 mg/dLVery High:  >190 mg/dL  POCT Glucose (CBG)     Status: Abnormal   Collection Time: 02/08/16  1:12 PM  Result Value Ref Range   POC Glucose 304 (A) 70 - 99 mg/dl    Assessment/Plan: Grade I hemorrhoids Diet and hydration reviewed. Stool softener recommended. Sitz baths  recommended. Rx Proctofoam.   Hepatitis, viral Has appointment with Hepatology. Has stopped alcohol consumption and is doing well. Encouraged patient to keep up with this. Hep A and Hep B series previously given.  Follow-up with specialty as scheduled for further assessment and treatment of chronic Hepatitis C.   Anal fissure Rx Proctofoam.  Supportive measures reviewed.  Referral to colorectal surgery if pain not resolving and healing not occurring.   Diabetes mellitus type II, uncontrolled New prescription vouchers given today. Patient to start medications as directed. Check fasting sugars daily. Due to uncontrolled diabetes and history of non-compliance, will refer to Endocrinology as well to hopefully drive home importance of getting this  under control. FU scheduled.     Leeanne Rio, PA-C

## 2016-02-24 DIAGNOSIS — K602 Anal fissure, unspecified: Secondary | ICD-10-CM | POA: Insufficient documentation

## 2016-02-24 DIAGNOSIS — K640 First degree hemorrhoids: Secondary | ICD-10-CM | POA: Insufficient documentation

## 2016-02-24 NOTE — Assessment & Plan Note (Signed)
New prescription vouchers given today. Patient to start medications as directed. Check fasting sugars daily. Due to uncontrolled diabetes and history of non-compliance, will refer to Endocrinology as well to hopefully drive home importance of getting this under control. FU scheduled.

## 2016-02-24 NOTE — Assessment & Plan Note (Signed)
Rx Proctofoam.  Supportive measures reviewed.  Referral to colorectal surgery if pain not resolving and healing not occurring.

## 2016-02-24 NOTE — Assessment & Plan Note (Signed)
Has appointment with Hepatology. Has stopped alcohol consumption and is doing well. Encouraged patient to keep up with this. Hep A and Hep B series previously given.  Follow-up with specialty as scheduled for further assessment and treatment of chronic Hepatitis C.

## 2016-02-24 NOTE — Assessment & Plan Note (Signed)
Diet and hydration reviewed. Stool softener recommended. Sitz baths recommended. Rx Proctofoam.

## 2016-02-26 ENCOUNTER — Telehealth: Payer: Self-pay | Admitting: Physician Assistant

## 2016-02-26 ENCOUNTER — Ambulatory Visit (INDEPENDENT_AMBULATORY_CARE_PROVIDER_SITE_OTHER): Payer: Managed Care, Other (non HMO) | Admitting: Podiatry

## 2016-02-26 ENCOUNTER — Ambulatory Visit (HOSPITAL_BASED_OUTPATIENT_CLINIC_OR_DEPARTMENT_OTHER)
Admission: RE | Admit: 2016-02-26 | Discharge: 2016-02-26 | Disposition: A | Discharge status: Home / Self Care | Payer: 59 | Source: Ambulatory Visit | Attending: Physician Assistant | Admitting: Physician Assistant

## 2016-02-26 ENCOUNTER — Encounter: Payer: Self-pay | Admitting: Podiatry

## 2016-02-26 DIAGNOSIS — I739 Peripheral vascular disease, unspecified: Secondary | ICD-10-CM | POA: Insufficient documentation

## 2016-02-26 DIAGNOSIS — E1149 Type 2 diabetes mellitus with other diabetic neurological complication: Secondary | ICD-10-CM | POA: Diagnosis not present

## 2016-02-26 DIAGNOSIS — M79675 Pain in left toe(s): Secondary | ICD-10-CM

## 2016-02-26 DIAGNOSIS — M25551 Pain in right hip: Secondary | ICD-10-CM | POA: Diagnosis present

## 2016-02-26 DIAGNOSIS — B351 Tinea unguium: Secondary | ICD-10-CM

## 2016-02-26 DIAGNOSIS — M79674 Pain in right toe(s): Secondary | ICD-10-CM

## 2016-02-26 MED ORDER — KETOCONAZOLE 2 % EX CREA: 1.0000 "application " | TOPICAL_CREAM | Freq: Every day | CUTANEOUS | 2 refills | Status: DC

## 2016-02-26 NOTE — Progress Notes (Addendum)
   Subjective:    Patient ID: Casey Reyes, male    DOB: [birth year], 61 y.o.   MRN: [id number]  HPI  61 year old male presents the office they for concerns of thick, painful, elongated toenails that he cannot trim himself. Also presents today for diabetic foot evaluation. He states that he could have treatment of the toenail fungus. The toenails to get painful to pressure in shoes. Denies any open sores. He gets some occasional numbness and tingling to his toes but denies any claudication symptoms. His last A1c was 10.6. No other complaints today.  Review of Systems  All other systems reviewed and are negative.      Objective:   Physical Exam General: AAO x3, NAD  Dermatological: Nails are hypertrophic, dystrophic, brittle, discolored, elongated 10. No surrounding redness or drainage. Tenderness nails 1-5 bilaterally. No open lesions or pre-ulcerative lesions are identified today. There dry, erythematous skin present interdigitally.   Vascular: Dorsalis Pedis artery and Posterior Tibial artery pedal pulses are 2/4 bilateral with immedate capillary fill time. There is no pain with calf compression, swelling, warmth, erythema.   Neruologic: Grossly intact via light touch bilateral. Vibratory intact via tuning fork bilateral. Protective threshold with Semmes Wienstein monofilament intact to all pedal sites bilateral. Subjectively he does have numbness/tingling to his toes.   Musculoskeletal: No gross boney pedal deformities bilateral. No pain, crepitus, or limitation noted with foot and ankle range of motion bilateral. Muscular strength 5/5 in all groups tested bilateral.  Gait: Unassisted, Nonantalgic.     Assessment & Plan:  61 year old male presents for diabetic foot evaluation, symptomatic onychomycosis, early neuropathy, tinea pedis -Treatment options discussed including all alternatives, risks, and complications -Etiology of symptoms were discussed -Diabetic foot evaluation was  completed today. -Nails debrided 10 without complications or bleeding. Discussed treatment optionsfor nail fungus. Given his elevated liver function 1 hold off on oral medication. Discussed over-the-counter topical treatment options for nail fungus. -Ketoconazole prescribed  -Daily foot inspection discussed -Follow-up in 3 months or sooner if any problems arise. In the meantime, encouraged to call the office with any questions, concerns, change in symptoms.   Celesta Gentile, DPM

## 2016-02-26 NOTE — Telephone Encounter (Signed)
Patient states he previously spoke with pcp about getting an x-ray of his hip done at [facility].  Patient reports he has an appt with podiatry this morning at 11am at the [facility] and would like to have x-ray done while he is there.

## 2016-02-26 NOTE — Telephone Encounter (Signed)
Order has been placed. Please inform patient

## 2016-02-26 NOTE — Patient Instructions (Signed)

## 2016-02-26 NOTE — Telephone Encounter (Signed)
Returned call to patient and informed him order has been placed.

## 2016-02-26 NOTE — Addendum Note (Signed)
Addended by: Celesta Gentile R on: 02/26/2016 12:02 PM   Modules accepted: Orders

## 2016-02-27 MED ORDER — KETOCONAZOLE 2 % EX CREA: 1.0000 "application " | TOPICAL_CREAM | Freq: Every day | CUTANEOUS | 2 refills | Status: DC

## 2016-02-27 NOTE — Addendum Note (Signed)
Addended by: Cranford Mon R on: 02/27/2016 11:08 AM   Modules accepted: Orders

## 2016-02-29 ENCOUNTER — Other Ambulatory Visit (HOSPITAL_COMMUNITY): Payer: Self-pay | Admitting: Nurse Practitioner

## 2016-02-29 DIAGNOSIS — B182 Chronic viral hepatitis C: Secondary | ICD-10-CM

## 2016-03-06 ENCOUNTER — Ambulatory Visit (HOSPITAL_COMMUNITY): Payer: Managed Care, Other (non HMO)

## 2016-03-06 ENCOUNTER — Other Ambulatory Visit: Payer: Self-pay | Admitting: Nurse Practitioner

## 2016-03-06 DIAGNOSIS — R772 Abnormality of alphafetoprotein: Secondary | ICD-10-CM

## 2016-03-06 DIAGNOSIS — K7469 Other cirrhosis of liver: Secondary | ICD-10-CM

## 2016-03-07 ENCOUNTER — Other Ambulatory Visit: Payer: Self-pay | Admitting: Nurse Practitioner

## 2016-03-07 DIAGNOSIS — Z1389 Encounter for screening for other disorder: Secondary | ICD-10-CM

## 2016-03-07 DIAGNOSIS — R772 Abnormality of alphafetoprotein: Secondary | ICD-10-CM

## 2016-03-07 DIAGNOSIS — K7469 Other cirrhosis of liver: Secondary | ICD-10-CM

## 2016-03-07 DIAGNOSIS — Z0189 Encounter for other specified special examinations: Secondary | ICD-10-CM

## 2016-03-12 ENCOUNTER — Encounter: Payer: Managed Care, Other (non HMO) | Attending: Physician Assistant | Admitting: *Deleted

## 2016-03-12 DIAGNOSIS — E1165 Type 2 diabetes mellitus with hyperglycemia: Secondary | ICD-10-CM | POA: Diagnosis not present

## 2016-03-12 DIAGNOSIS — Z713 Dietary counseling and surveillance: Secondary | ICD-10-CM | POA: Diagnosis not present

## 2016-03-12 DIAGNOSIS — E1142 Type 2 diabetes mellitus with diabetic polyneuropathy: Secondary | ICD-10-CM | POA: Insufficient documentation

## 2016-03-12 DIAGNOSIS — E1149 Type 2 diabetes mellitus with other diabetic neurological complication: Secondary | ICD-10-CM | POA: Insufficient documentation

## 2016-03-12 DIAGNOSIS — IMO0002 Reserved for concepts with insufficient information to code with codable children: Secondary | ICD-10-CM

## 2016-03-12 NOTE — Patient Instructions (Signed)
Plan:  Aim for 3 Carb Choices per meal (45 grams) +/- 1 either way  Aim for 0-2 Carbs per snack if hungry  Include protein in moderation with your meals and snacks Consider reading food labels for Total Carbohydrate of foods Consider  increasing your activity level daily as tolerated Continue checking BG at alternate times per day   Continue taking medication as directed by MD

## 2016-03-14 ENCOUNTER — Ambulatory Visit
Admission: RE | Admit: 2016-03-14 | Discharge: 2016-03-14 | Disposition: A | Discharge status: Home / Self Care | Payer: Managed Care, Other (non HMO) | Source: Ambulatory Visit | Attending: Nurse Practitioner | Admitting: Nurse Practitioner

## 2016-03-14 DIAGNOSIS — R772 Abnormality of alphafetoprotein: Secondary | ICD-10-CM

## 2016-03-14 DIAGNOSIS — Z1389 Encounter for screening for other disorder: Secondary | ICD-10-CM

## 2016-03-14 DIAGNOSIS — K7469 Other cirrhosis of liver: Secondary | ICD-10-CM

## 2016-03-14 MED ORDER — GADOXETATE DISODIUM 0.25 MMOL/ML IV SOLN
10.0000 mL | Freq: Once | INTRAVENOUS | Status: AC | PRN
  Administered 2016-03-14: 10 mL via INTRAVENOUS

## 2016-03-14 MED ORDER — DIPHENHYDRAMINE HCL 50 MG PO CAPS
50.0000 mg | ORAL_CAPSULE | Freq: Four times a day (QID) | ORAL | Status: DC | PRN
  Administered 2016-03-14: 50 mg via ORAL

## 2016-03-14 NOTE — Progress Notes (Signed)
Called to see patient in MRI suite at [place] for reaction to Eovist contrast. Patient developed hives predominantly on the chest and face with itching after contrast injection. He was able to complete the MRI examination and had no other complaints. He was given benadryl 50 mg po and monitored without development of new or worsening symptoms. He was discharged in the care of his wife. They were instructed on monitoring for new symptoms that would require additional immediate medical attention.

## 2016-03-19 ENCOUNTER — Ambulatory Visit: Payer: Managed Care, Other (non HMO) | Admitting: Physician Assistant

## 2016-03-20 ENCOUNTER — Ambulatory Visit: Payer: Managed Care, Other (non HMO) | Admitting: Physician Assistant

## 2016-03-24 ENCOUNTER — Telehealth: Payer: Self-pay | Admitting: Hematology

## 2016-03-24 ENCOUNTER — Encounter: Payer: Self-pay | Admitting: Hematology

## 2016-03-24 NOTE — Telephone Encounter (Signed)
Tried to contact the pt regarding appt that has been made for him to see Dr. Burr Medico on 3/2. His vm has not been setup. Will mail a letter and try again

## 2016-03-26 NOTE — Progress Notes (Signed)
Diabetes Self-Management Education  Visit Type: First/Initial  Appt. Start Time: 1530 Appt. End Time: 1700  03/26/2016  Mr. Casey Reyes, identified by name and date of birth, is a 61 y.o. male with a diagnosis of Diabetes: Type 2. He is interested in knowing more about good food choices as well as economical foods to eat. 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 He does travel to wherever the work is, so is often out of town for several weeks at a time. See EPIC notes for DSME assessment information.   ASSESSMENT  Height 5\' 7"  (1.702 m), weight 237 lb 14.4 oz (107.9 kg). Body mass index is 37.26 kg/m.   Individualized Plan for Diabetes Self-Management Training:   Learning Objective:  Patient will have a greater understanding of diabetes self-management. Patient education plan is to attend individual and/or group sessions per assessed needs and concerns.   Plan:   Patient Instructions  Plan:  Aim for 3 Carb Choices per meal (45 grams) +/- 1 either way  Aim for 0-2 Carbs per snack if hungry  Include protein in moderation with your meals and snacks Consider reading food labels for Total Carbohydrate of foods Consider  increasing your activity level daily as tolerated Continue checking BG at alternate times per day   Continue taking medication as directed by MD  Expected Outcomes:  Demonstrated interest in learning. Expect positive outcomes  Education material provided: Living Well with Diabetes, Meal plan card and Carbohydrate counting sheet  If problems or questions, patient to contact team via:  Phone and Email  Future DSME appointment: PRN

## 2016-03-27 ENCOUNTER — Encounter: Payer: Self-pay | Admitting: Physician Assistant

## 2016-03-27 ENCOUNTER — Inpatient Hospital Stay (HOSPITAL_BASED_OUTPATIENT_CLINIC_OR_DEPARTMENT_OTHER)
Admission: EM | Admit: 2016-03-27 | Discharge: 2016-03-31 | DRG: 640 | Disposition: A | Discharge status: Home / Self Care | Payer: 59 | Attending: Internal Medicine | Admitting: Internal Medicine

## 2016-03-27 ENCOUNTER — Encounter (HOSPITAL_BASED_OUTPATIENT_CLINIC_OR_DEPARTMENT_OTHER): Payer: Self-pay | Admitting: Emergency Medicine

## 2016-03-27 ENCOUNTER — Ambulatory Visit (INDEPENDENT_AMBULATORY_CARE_PROVIDER_SITE_OTHER): Payer: 59 | Admitting: Physician Assistant

## 2016-03-27 ENCOUNTER — Telehealth: Payer: Self-pay | Admitting: Physician Assistant

## 2016-03-27 VITALS — BP 122/82 | HR 89 | Temp 97.4°F | Resp 14 | Ht 67.0 in | Wt 233.0 lb

## 2016-03-27 DIAGNOSIS — C220 Liver cell carcinoma: Secondary | ICD-10-CM | POA: Diagnosis present

## 2016-03-27 DIAGNOSIS — I10 Essential (primary) hypertension: Secondary | ICD-10-CM

## 2016-03-27 DIAGNOSIS — B1920 Unspecified viral hepatitis C without hepatic coma: Secondary | ICD-10-CM | POA: Diagnosis not present

## 2016-03-27 DIAGNOSIS — E1142 Type 2 diabetes mellitus with diabetic polyneuropathy: Secondary | ICD-10-CM | POA: Diagnosis not present

## 2016-03-27 DIAGNOSIS — IMO0002 Reserved for concepts with insufficient information to code with codable children: Secondary | ICD-10-CM | POA: Diagnosis present

## 2016-03-27 DIAGNOSIS — J209 Acute bronchitis, unspecified: Secondary | ICD-10-CM

## 2016-03-27 DIAGNOSIS — F17210 Nicotine dependence, cigarettes, uncomplicated: Secondary | ICD-10-CM | POA: Diagnosis present

## 2016-03-27 DIAGNOSIS — I81 Portal vein thrombosis: Secondary | ICD-10-CM | POA: Diagnosis present

## 2016-03-27 DIAGNOSIS — E1165 Type 2 diabetes mellitus with hyperglycemia: Secondary | ICD-10-CM | POA: Diagnosis present

## 2016-03-27 DIAGNOSIS — C229 Malignant neoplasm of liver, not specified as primary or secondary: Secondary | ICD-10-CM

## 2016-03-27 DIAGNOSIS — N401 Enlarged prostate with lower urinary tract symptoms: Secondary | ICD-10-CM | POA: Diagnosis present

## 2016-03-27 DIAGNOSIS — K7460 Unspecified cirrhosis of liver: Secondary | ICD-10-CM | POA: Diagnosis present

## 2016-03-27 DIAGNOSIS — Z833 Family history of diabetes mellitus: Secondary | ICD-10-CM

## 2016-03-27 DIAGNOSIS — K7469 Other cirrhosis of liver: Secondary | ICD-10-CM | POA: Diagnosis not present

## 2016-03-27 DIAGNOSIS — Z888 Allergy status to other drugs, medicaments and biological substances status: Secondary | ICD-10-CM

## 2016-03-27 DIAGNOSIS — R748 Abnormal levels of other serum enzymes: Secondary | ICD-10-CM

## 2016-03-27 DIAGNOSIS — R351 Nocturia: Secondary | ICD-10-CM | POA: Diagnosis present

## 2016-03-27 DIAGNOSIS — R160 Hepatomegaly, not elsewhere classified: Secondary | ICD-10-CM | POA: Diagnosis not present

## 2016-03-27 DIAGNOSIS — Z7984 Long term (current) use of oral hypoglycemic drugs: Secondary | ICD-10-CM

## 2016-03-27 DIAGNOSIS — Z79899 Other long term (current) drug therapy: Secondary | ICD-10-CM

## 2016-03-27 DIAGNOSIS — B182 Chronic viral hepatitis C: Secondary | ICD-10-CM | POA: Diagnosis present

## 2016-03-27 DIAGNOSIS — Z8249 Family history of ischemic heart disease and other diseases of the circulatory system: Secondary | ICD-10-CM | POA: Diagnosis not present

## 2016-03-27 HISTORY — DX: Liver cell carcinoma: C22.0

## 2016-03-27 LAB — URINALYSIS, ROUTINE W REFLEX MICROSCOPIC
Glucose, UA: NEGATIVE mg/dL
Hgb urine dipstick: NEGATIVE
KETONES UR: NEGATIVE mg/dL
Leukocytes, UA: NEGATIVE
NITRITE: NEGATIVE
PROTEIN: NEGATIVE mg/dL
Specific Gravity, Urine: 1.016 (ref 1.005–1.030)
pH: 5.5 (ref 5.0–8.0)

## 2016-03-27 LAB — COMPREHENSIVE METABOLIC PANEL
ALBUMIN: 3.8 g/dL (ref 3.5–5.2)
ALT: 47 U/L (ref 0–53)
AST: 202 U/L — ABNORMAL HIGH (ref 0–37)
Alkaline Phosphatase: 216 U/L — ABNORMAL HIGH (ref 39–117)
BUN: 12 mg/dL (ref 6–23)
CALCIUM: 13.3 mg/dL — AB (ref 8.4–10.5)
CO2: 28 mEq/L (ref 19–32)
Chloride: 101 mEq/L (ref 96–112)
Creatinine, Ser: 0.98 mg/dL (ref 0.40–1.50)
GFR: 99.98 mL/min (ref >60.00)
Glucose, Bld: 230 mg/dL — ABNORMAL HIGH (ref 70–99)
POTASSIUM: 4.3 meq/L (ref 3.5–5.1)
SODIUM: 132 meq/L — AB (ref 135–145)
Total Bilirubin: 2.6 mg/dL — ABNORMAL HIGH (ref 0.2–1.2)
Total Protein: 7.2 g/dL (ref 6.0–8.3)

## 2016-03-27 LAB — BASIC METABOLIC PANEL
ANION GAP: 6 (ref 5–15)
BUN: 12 mg/dL (ref 6–20)
CALCIUM: 13.6 mg/dL — AB (ref 8.9–10.3)
CO2: 28 mmol/L (ref 22–32)
Chloride: 102 mmol/L (ref 101–111)
Creatinine, Ser: 1.04 mg/dL (ref 0.61–1.24)
GLUCOSE: 219 mg/dL — AB (ref 65–99)
Potassium: 4.3 mmol/L (ref 3.5–5.1)
Sodium: 136 mmol/L (ref 135–145)

## 2016-03-27 LAB — CBC
HCT: 41.7 % (ref 39.0–52.0)
HEMOGLOBIN: 14.5 g/dL (ref 13.0–17.0)
MCH: 28.7 pg (ref 26.0–34.0)
MCHC: 34.8 g/dL (ref 30.0–36.0)
MCV: 82.6 fL (ref 78.0–100.0)
PLATELETS: 242 10*3/uL (ref 150–400)
RBC: 5.05 MIL/uL (ref 4.22–5.81)
RDW: 16.3 % — ABNORMAL HIGH (ref 11.5–15.5)
WBC: 5.6 10*3/uL (ref 4.0–10.5)

## 2016-03-27 LAB — HEPATIC FUNCTION PANEL
ALT: 47 U/L (ref 17–63)
AST: 202 U/L — ABNORMAL HIGH (ref 15–41)
Albumin: 3.2 g/dL — ABNORMAL LOW (ref 3.5–5.0)
Alkaline Phosphatase: 188 U/L — ABNORMAL HIGH (ref 38–126)
BILIRUBIN DIRECT: 1.2 mg/dL — AB (ref 0.1–0.5)
BILIRUBIN INDIRECT: 1.2 mg/dL — AB (ref 0.3–0.9)
BILIRUBIN TOTAL: 2.4 mg/dL — AB (ref 0.3–1.2)
Total Protein: 7.3 g/dL (ref 6.5–8.1)

## 2016-03-27 LAB — PROTIME-INR
INR: 0.99
PROTHROMBIN TIME: 13.1 s (ref 11.4–15.2)

## 2016-03-27 LAB — I-STAT CG4 LACTIC ACID, ED: LACTIC ACID, VENOUS: 1.4 mmol/L (ref 0.5–1.9)

## 2016-03-27 LAB — CBG MONITORING, ED: GLUCOSE-CAPILLARY: 222 mg/dL — AB (ref 65–99)

## 2016-03-27 MED ORDER — FLUTICASONE PROPIONATE 50 MCG/ACT NA SUSP: 2.0000 | Freq: Every day | NASAL | 6 refills | Status: DC

## 2016-03-27 MED ORDER — SODIUM CHLORIDE 0.9 % IV BOLUS (SEPSIS)
1000.0000 mL | Freq: Once | INTRAVENOUS | Status: AC
Start: 2016-03-27 — End: 2016-03-27
  Administered 2016-03-27: 1000 mL via INTRAVENOUS

## 2016-03-27 MED ORDER — SODIUM CHLORIDE 0.9 % IV SOLN
Freq: Once | INTRAVENOUS | Status: AC
  Administered 2016-03-28: 01:00:00 via INTRAVENOUS

## 2016-03-27 MED ORDER — SODIUM CHLORIDE 0.9 % IV BOLUS (SEPSIS)
1000.0000 mL | Freq: Once | INTRAVENOUS | Status: AC
  Administered 2016-03-27: 1000 mL via INTRAVENOUS

## 2016-03-27 NOTE — ED Notes (Signed)
ED Provider at bedside. 

## 2016-03-27 NOTE — Patient Instructions (Addendum)
Please keep hydrated and eat a well-balanced diet.  Do not skip meals while on diabetes medications.  Stop the Glimepiride.  Restart the Jardiance taking 1/2 tablet (5 mg) daily for a few days.  Check fasting sugars: If they are running > 150 daily, increase Jardiance to 10 mg daily.  If they are running 100-150 continue the 5 mg for now. If < 90, please stop medication and call me.  Over time we will get a more tight control of symptoms.  I am going to call you Monday to review your sugar levels!  Please follow-up with Oncology tomorrow as scheduled.  They will take phenomenal care of you!  I will keep an eye out on their notes.  Please start the Flonase as directed. Use saline nasal rinse twice daily.  Call me if symptoms are not resolving.

## 2016-03-27 NOTE — ED Notes (Signed)
Patient requested to see doctor - relayed information to Dr. Sherry Ruffing

## 2016-03-27 NOTE — ED Notes (Signed)
Given po fluids 

## 2016-03-27 NOTE — Progress Notes (Signed)
61 year old male with pmh of HTN, DM type 2, and hepatitis C; presented from PCP office for abnormal calcium of 11 mg/dL. Repeat check on admission 13.6. Other associated symptoms include generalized weakness and fatigue. Recent MRI of abdomen from 2/16 revealed multiple hepatic lesions suggestive of hepatocellular carcinoma. EKG with without significant changes. VS stable. Patient ordered to be given 2 L of IV fluids. Admit inpatient to a telemetry bed.

## 2016-03-27 NOTE — Progress Notes (Signed)
Pre visit review using our clinic review tool, if applicable. No additional management support is needed unless otherwise documented below in the visit note. 

## 2016-03-27 NOTE — ED Notes (Signed)
Cancelled [facility] - patient will be transported by private vehicle

## 2016-03-27 NOTE — Progress Notes (Signed)
Patient presents to clinic today for follow-up of diabetes mellitus and hypertension. Patient also with history of chronic Hep C, previously non-compliant with our and  Gastroenterology's recommendations.   Hypertension --  Patient is currently on losartan 50 mg daily. Endorses taking medication as directed. Patient denies chest pain, palpitations, lightheadedness, dizziness, vision changes or frequent headaches.  BP Readings from Last 3 Encounters:  03/30/16 135/87  03/27/16 122/82  02/22/16 138/84   Diabetes Mellitus II -- At last visit, patient had Jardiance added to Amaryl. Patient cannot tolerate Metformin due to previously causing renal failure. Is taking medications as directed. Notes sugars recently lower at 100-150 making him feel tired. Had a couple of days where fasting sugars were < 80. States he has not been eating, skipping meals. Has been feeling down since his appointment with Hepatology. Denies SI/HI.   Hepatitis C -- Longstanding. Previously non compliant with Hepatology follow-up and instructions. Has recently seen new Hepatology specialist with MRI concerning for [facility]. Has appointment with Oncology tomorrow. Patient has stopped all alcohol consumption. Up to date on Hep A and Hep B boosters.    Past Medical History:  Diagnosis Date  . Chicken pox   . Diabetes mellitus without complication ([place])   . Hepatitis C   . Hypertension     Current Outpatient Prescriptions on File Prior to Visit  Medication Sig Dispense Refill  . empagliflozin (JARDIANCE) 10 MG TABS tablet Take 10 mg by mouth daily. 30 tablet 1  . fluticasone (FLONASE) 50 MCG/ACT nasal spray USE 2 SPRAYS IN EACH NOSTRIL QD FOR SINUS CONGESTION 16 g 0  . glimepiride (AMARYL) 2 MG tablet Take 1 tablet (2 mg total) by mouth daily before breakfast. 30 tablet 1  . glucose blood (ONETOUCH VERIO) test strip Onetouch Verio. Check Blood sugar once day. Dx: F027 100 each 12  . hydrocortisone-pramoxine (PROCTOFOAM-HC)  rectal foam Place 1 applicator rectally 2 (two) times daily. X 6 days 10 g 0  . ibuprofen (ADVIL,MOTRIN) 200 MG tablet Take 200 mg by mouth every 6 (six) hours as needed. Reported on 06/04/2015    . ketoconazole (NIZORAL) 2 % cream Apply 1 application topically daily. 60 g 2  . loratadine (CLARITIN) 10 MG tablet Take 1 tablet (10 mg total) by mouth daily as needed for allergies. 30 tablet 5  . losartan (COZAAR) 50 MG tablet Take 1 tablet (50 mg total) by mouth daily. 90 tablet 0  . tamsulosin (FLOMAX) 0.4 MG CAPS capsule Take 2 capsules (0.8 mg total) by mouth daily. 60 capsule 1  . pregabalin (LYRICA) 100 MG capsule Take 1 capsule (100 mg total) by mouth 2 (two) times daily. (Patient not taking: Reported on 02/22/2016) 60 capsule 1   No current facility-administered medications on file prior to visit.     Allergies  Allergen Reactions  . Eovist [Gadoxetate] Hives and Itching    Hives and itching over face and chest after 10 ml Eovist given. 50 mg Benadryl given and he was observed by nurse and Dr. Jeralyn Ruths until symptoms improved and he was released. ry    Family History  Problem Relation Age of Onset  . Heart attack Father     Deceased  . Heart disease Father   . Diabetes Mother     Living  . Heart attack Brother 28    Deceased  . Sudden death Brother   . Thyroid disease Sister   . Diabetes Sister   . Healthy Sister     x1  .  Colon cancer Brother     x1  . Heart disease Maternal Grandfather   . Allergies Daughter   . Diabetes Son   . Diabetes Maternal Grandmother     Social History   Social History  . Marital status: Divorced    Spouse name: N/A  . Number of children: N/A  . Years of education: N/A   Social History Main Topics  . Smoking status: Current Some Day Smoker    Packs/day: 0.25    Types: Cigarettes  . Smokeless tobacco: Never Used  . Alcohol use Yes  . Drug use: No  . Sexual activity: Not Asked   Other Topics Concern  . None   Social History Narrative   . None    Review of Systems - See HPI.  All other ROS are negative.  BP 122/82   Pulse 89   Temp 97.4 F (36.3 C) (Oral)   Resp 14   Ht _0  (1.702 m)   Wt 233 lb (105.7 kg)   SpO2 98%   BMI 36.49 kg/m   Physical Exam  Constitutional: He is oriented to person, place, and time and well-developed, well-nourished, and in no distress.  HENT:  Head: Normocephalic and atraumatic.  Eyes: Conjunctivae are normal.  Cardiovascular: Normal rate, regular rhythm, normal heart sounds and intact distal pulses.   Pulmonary/Chest: Effort normal and breath sounds normal. No respiratory distress. He has no wheezes. He has no rales. He exhibits no tenderness.  Neurological: He is alert and oriented to person, place, and time. No cranial nerve deficit.  Skin: Skin is warm and dry. No rash noted.  Psychiatric: Affect normal.  Vitals reviewed.   Recent Results (from the past 2160 hour(s))  Comp Met (CMET)     Status: Abnormal   Collection Time: 02/08/16 12:31 PM  Result Value Ref Range   Sodium 137 135 - 145 mEq/L   Potassium 3.9 3.5 - 5.1 mEq/L   Chloride 101 96 - 112 mEq/L   CO2 27 19 - 32 mEq/L   Glucose, Bld 276 (H) 70 - 99 mg/dL   BUN 11 6 - 23 mg/dL   Creatinine, Ser 0.80 0.40 - 1.50 mg/dL   Total Bilirubin 0.6 0.2 - 1.2 mg/dL   Alkaline Phosphatase 123 (H) 39 - 117 U/L   AST 112 (H) 0 - 37 U/L   ALT 70 (H) 0 - 53 U/L   Total Protein 6.9 6.0 - 8.3 g/dL   Albumin 3.9 3.5 - 5.2 g/dL   Calcium 9.7 8.4 - 10.5 mg/dL   GFR 126.41 >60.00 mL/min  CBC     Status: Abnormal   Collection Time: 02/08/16 12:31 PM  Result Value Ref Range   WBC 2.4 Repeated and verified X2. (L) 4.0 - 10.5 K/uL   RBC 5.26 4.22 - 5.81 Mil/uL   Platelets 107.0 (L) 150.0 - 400.0 K/uL   Hemoglobin 15.2 13.0 - 17.0 g/dL   HCT 45.5 39.0 - 52.0 %   MCV 86.6 78.0 - 100.0 fl   MCHC 33.3 30.0 - 36.0 g/dL   RDW 14.1 11.5 - 15.5 %  Hemoglobin A1c     Status: Abnormal   Collection Time: 02/08/16 12:31 PM  Result  Value Ref Range   Hgb A1c MFr Bld 10.6 (H) 4.6 - 6.5 %    Comment: Glycemic Control Guidelines for People with Diabetes:Non Diabetic:  <6%Goal of Therapy: <7%Additional Action Suggested:  >8%   Urinalysis, Routine w reflex microscopic  Status: Abnormal   Collection Time: 02/08/16 12:31 PM  Result Value Ref Range   Color, Urine YELLOW Yellow;Lt. Yellow   APPearance CLEAR Clear   Specific Gravity, Urine 1.020 1.000 - 1.030   pH 6.0 5.0 - 8.0   Total Protein, Urine NEGATIVE Negative   Urine Glucose >=1000 (A) Negative   Ketones, ur NEGATIVE Negative   Bilirubin Urine NEGATIVE Negative   Hgb urine dipstick NEGATIVE Negative   Urobilinogen, UA 0.2 0.0 - 1.0   Leukocytes, UA NEGATIVE Negative   Nitrite NEGATIVE Negative   WBC, UA none seen 0-2/hpf   RBC / HPF none seen 0-2/hpf   Squamous Epithelial / LPF Rare(0-4/hpf) Rare(0-4/hpf)  PSA     Status: None   Collection Time: 02/08/16 12:31 PM  Result Value Ref Range   PSA 1.58 0.10 - 4.00 ng/mL  Lipid panel     Status: Abnormal   Collection Time: 02/08/16 12:31 PM  Result Value Ref Range   Cholesterol 184 0 - 200 mg/dL    Comment: ATP III Classification       Desirable:  < 200 mg/dL               Borderline High:  200 - 239 mg/dL          High:  > = 240 mg/dL   Triglycerides 238.0 (H) 0.0 - 149.0 mg/dL    Comment: Normal:  <150 mg/dLBorderline High:  150 - 199 mg/dL   HDL 21.80 (L) >39.00 mg/dL   VLDL 47.6 (H) 0.0 - 40.0 mg/dL   Total CHOL/HDL Ratio 8     Comment:                Men          Women1/2 Average Risk     3.4          3.3Average Risk          5.0          4.42X Average Risk          9.6          7.13X Average Risk          15.0          11.0                       NonHDL 161.77     Comment: NOTE:  Non-HDL goal should be 30 mg/dL higher than patient's LDL goal (i.e. LDL goal of < 70 mg/dL, would have non-HDL goal of < 100 mg/dL)  Hepatitis C RNA quantitative     Status: Abnormal   Collection Time: 02/08/16 12:31 PM  Result  Value Ref Range   HCV Quantitative 5,033,001 (H) <15 IU/mL   HCV Quantitative Log 6.70 (H) <1.18 log 10  LDL cholesterol, direct     Status: None   Collection Time: 02/08/16 12:31 PM  Result Value Ref Range   Direct LDL 123.0 mg/dL    Comment: Optimal:  <100 mg/dLNear or Above Optimal:  100-129 mg/dLBorderline High:  130-159 mg/dLHigh:  160-189 mg/dLVery High:  >190 mg/dL  POCT Glucose (CBG)     Status: Abnormal   Collection Time: 02/08/16  1:12 PM  Result Value Ref Range   POC Glucose 304 (A) 70 - 99 mg/dl   Assessment/Plan:  Essential hypertension BP controlled today. Asymptomatic. Will repeat labs today.   3. Hepatitis C virus infection without hepatic coma, unspecified chronicity Followed by Hepatology.  Continue treatment as discussed.  4. Hepatocellular carcinoma ([place]) New diagnosis. Appointment with Oncology tomorrow to determine next steps.  5. Uncontrolled type 2 diabetes mellitus with diabetic polyneuropathy, without long-term current use of insulin (H[facility]) Significant decrease in intake due to depressed mood. Discussed counseling and support groups. Handout givent. Weight loss noted secondary to decreased intake and Jardiance. Will resume Jardiance at 5 mg and hold SU for now. Strict guidelines given for medication related to sugar levels. Will call Monday to discuss sugars and next steps. Patient also with LDL > 100. Needs to start medication but will get input from Hepatology. Labs today.   Leeanne Rio, PA-C

## 2016-03-27 NOTE — ED Notes (Signed)
CA 13.6 critical result given to ED MD

## 2016-03-27 NOTE — ED Notes (Signed)
Given dinner tray

## 2016-03-27 NOTE — ED Triage Notes (Signed)
Pt being sent from PCP with abnormal calcium level. Pt states he has been feeling bad for a week, has been weak and not feeling himself. Denies SOB, chx pain or fevers.   *per patient PCP has faxed over blood work information

## 2016-03-27 NOTE — ED Provider Notes (Signed)
[facility] Provider Note   CSN: [id number] Arrival date & time: 03/27/16  1825  By signing my name below, I, Dora Sims, attest that this documentation has been prepared under the direction and in the presence of physician practitioner, Courtney Paris, MD. Electronically Signed: Dora Sims, Scribe. 03/27/2016. 7:52 PM.  History   Chief Complaint Chief Complaint  Patient presents with  . Abnormal Lab  . Weakness    The history is provided by the patient. No language interpreter was used.     HPI Comments: Casey Reyes is a 61 y.o. male with PMHx including DM, Hepatitis C, HTN and is currently undergoing a workup for possible hepatocellular carcinoma who presents to the Emergency Department from his PCP office for evaluation of an abnormal lab, sleepiness, and abdominal pain. Patient had blood work performed at his PCP's office today and was advised to come to the ED after his calcium level was elevated around 11 mg/dL. Pt reports intermittent right-sided abdominal pain for 3-4 days as well as fatigue and generalized weakness. Family says he is much sleepier than normal. He states he has had difficulty balancing while ambulating today but denies any falls. He states he has been eating and drinking normally. Patient reports his PCP has expressed concern for liver cancer and his liver function is being closely monitored. He denies CP, SOB, palpitations, nausea, vomiting, constipation, diarrhea, dysuria, urinary frequency, back pain, blurry/double vision, numbness/tingling, rhinorrhea, nasal congestion, cough, or any other associated symptoms. He has an appointment with a hepatologist tomorrow.  PCP: Raiford Noble, PA-C  Past Medical History:  Diagnosis Date  . Chicken pox   . Diabetes mellitus without complication ([place])   . Hepatitis C   . Hypertension     Patient Active Problem List   Diagnosis Date Noted  . Grade I hemorrhoids 02/24/2016  . Anal fissure  02/24/2016  . Elevated liver enzymes 02/14/2016  . BPH associated with nocturia 02/14/2016  . Prostate cancer screening 06/04/2015  . Diabetic neuropathy ([place]) 08/03/2014  . Insomnia 04/17/2014  . Hepatitis, viral 01/16/2014  . Diabetes mellitus type II, uncontrolled ([place]) 12/20/2013  . Visit for preventive health examination 07/24/2013  . Essential hypertension 07/24/2013  . Erectile dysfunction 07/24/2013    Past Surgical History:  Procedure Laterality Date  . FINGER SURGERY     Right-Tendon Cut  . WISDOM TOOTH EXTRACTION         Home Medications    Prior to Admission medications   Medication Sig Start Date End Date Taking? Authorizing Provider  empagliflozin (JARDIANCE) 10 MG TABS tablet Take 10 mg by mouth daily. 02/15/16  Yes Brunetta Jeans, PA-C  fluticasone (FLONASE) 50 MCG/ACT nasal spray Place 2 sprays into both nostrils daily. 03/27/16  Yes Brunetta Jeans, PA-C  glucose blood (ONETOUCH VERIO) test strip [employer]. Check Blood sugar once day. Dx: E119 06/04/15  Yes Brunetta Jeans, PA-C  ibuprofen (ADVIL,MOTRIN) 200 MG tablet Take 200 mg by mouth every 6 (six) hours as needed. Reported on 06/04/2015   Yes Historical Provider, MD  loratadine (CLARITIN) 10 MG tablet Take 1 tablet (10 mg total) by mouth daily as needed for allergies. 01/30/15  Yes Brunetta Jeans, PA-C  losartan (COZAAR) 50 MG tablet Take 1 tablet (50 mg total) by mouth daily. 02/08/16  Yes Brunetta Jeans, PA-C  tamsulosin (FLOMAX) 0.4 MG CAPS capsule Take 2 capsules (0.8 mg total) by mouth daily. 02/08/16  Yes Brunetta Jeans, PA-C  ketoconazole (NIZORAL)  2 % cream Apply 1 application topically daily. 02/27/16   Trula Slade, DPM    Family History Family History  Problem Relation Age of Onset  . Heart attack Father     Deceased  . Heart disease Father   . Diabetes Mother     Living  . Heart attack Brother 61    Deceased  . Sudden death Brother   . Thyroid disease Sister   . Diabetes  Sister   . Healthy Sister     x1  . Colon cancer Brother     x1  . Heart disease Maternal Grandfather   . Allergies Daughter   . Diabetes Son   . Diabetes Maternal Grandmother     Social History Social History  Substance Use Topics  . Smoking status: Current Some Day Smoker    Packs/day: 0.25    Types: Cigarettes  . Smokeless tobacco: Never Used  . Alcohol use Yes     Allergies   Eovist [gadoxetate]   Review of Systems Review of Systems  Constitutional: Positive for fatigue.  HENT: Negative for congestion and rhinorrhea.   Eyes: Negative for visual disturbance.  Respiratory: Negative for cough and shortness of breath.   Cardiovascular: Negative for chest pain and palpitations.  Gastrointestinal: Positive for abdominal pain. Negative for constipation, diarrhea, nausea and vomiting.  Genitourinary: Negative for frequency.  Musculoskeletal: Positive for gait problem. Negative for back pain.  Neurological: Positive for weakness (generalized). Negative for numbness.  All other systems reviewed and are negative.    Physical Exam Updated Vital Signs BP 128/81 (BP Location: Left Arm)   Pulse 80   Temp 97.6 F (36.4 C)   Resp 20   Ht 5\' 7"  (1.702 m)   Wt 233 lb (105.7 kg)   SpO2 100%   BMI 36.49 kg/m   Physical Exam  Constitutional: He is oriented to person, place, and time. He appears well-developed and well-nourished. No distress.  Drowsy.  HENT:  Head: Normocephalic and atraumatic.  Right Ear: External ear normal.  Left Ear: External ear normal.  Nose: Nose normal.  Mouth/Throat: Oropharynx is clear and moist. No oropharyngeal exudate.  Eyes: Conjunctivae and EOM are normal. Pupils are equal, round, and reactive to light.  Neck: Normal range of motion. Neck supple.  Pulmonary/Chest: No stridor. No respiratory distress.  Abdominal: Soft. There is no tenderness. There is no rebound and no guarding.  Musculoskeletal: He exhibits no edema.  Neurological: He  is alert and oriented to person, place, and time. He displays normal reflexes. No cranial nerve deficit. He exhibits normal muscle tone. Coordination normal.  No focal deficits.  Skin: Skin is warm. No rash noted. He is not diaphoretic. No erythema.     ED Treatments / Results  Labs (all labs ordered are listed, but only abnormal results are displayed) Labs Reviewed  BASIC METABOLIC PANEL - Abnormal; Notable for the following:       Result Value   Glucose, Bld 219 (*)    Calcium 13.6 (*)    All other components within normal limits  CBC - Abnormal; Notable for the following:    RDW 16.3 (*)    All other components within normal limits  URINALYSIS, ROUTINE W REFLEX MICROSCOPIC - Abnormal; Notable for the following:    Color, Urine AMBER (*)    Bilirubin Urine SMALL (*)    All other components within normal limits  HEPATIC FUNCTION PANEL - Abnormal; Notable for the following:    Albumin  3.2 (*)    AST 202 (*)    Alkaline Phosphatase 188 (*)    Total Bilirubin 2.4 (*)    Bilirubin, Direct 1.2 (*)    Indirect Bilirubin 1.2 (*)    All other components within normal limits  CBG MONITORING, ED - Abnormal; Notable for the following:    Glucose-Capillary 222 (*)    All other components within normal limits  PROTIME-INR  I-STAT CG4 LACTIC ACID, ED    EKG  EKG Interpretation  Date/Time:  Thursday March 27 2016 18:58:13 EST Ventricular Rate:  84 PR Interval:  136 QRS Duration: 86 QT Interval:  366 QTC Calculation: 432 R Axis:   -13 Text Interpretation:  Normal sinus rhythm Normal ECG When compared to prior, New T wave inversion in lead 3 No STEMI Confirmed by Rexanne Inocencio MD, Dlisa Barnwell [id number]) on 03/27/2016 7:29:43 PM       Radiology No results found.  Procedures Procedures (including critical care time)  DIAGNOSTIC STUDIES: Oxygen Saturation is 99% on RA, normal by my interpretation.    COORDINATION OF CARE: 8:09 PM Discussed treatment plan with pt at bedside and pt  agreed to plan.  Medications Ordered in ED Medications  0.9 %  sodium chloride infusion (not administered)  sodium chloride 0.9 % bolus 1,000 mL (0 mLs Intravenous Stopped 03/27/16 2239)  sodium chloride 0.9 % bolus 1,000 mL (0 mLs Intravenous Stopped 03/27/16 2239)     Initial Impression / Assessment and Plan / ED Course  I have reviewed the triage vital signs and the nursing notes.  Pertinent labs & imaging results that were available during my care of the patient were reviewed by me and considered in my medical decision making (see chart for details).     Zadian Perkey is a 61 y.o. male with PMHx including DM, Hepatitis C, HTN and is currently undergoing a workup for possible hepatocellular carcinoma who presents to the Emergency Department from his PCP office for evaluation of an abnormal lab, sleepiness, and abdominal pain.  History and exam are seen above. On exam, patient's abdomen was nontender. Patient's lungs were clear. Patient had no focal neurologic deficits but was sleepy.  Patient had repeat laboratory testing showing continued elevation of calcium. Calcium elevated at 13.6. Suspect this is the cause of the patient's sleepiness and somnolence and fatigue. Patient's liver function was similar to prior. Patient does not have evidence of acute urinary tract infection or current infection.  Patient given fluids for rehydration and to treat the hypercalcemia.  Given elevating calcium and patient's somnolence, will need admission for further management of hypercalcemia. Patient will be admitted to [facility] for further management.   Initially, patient was going to be transferred via ambulance however, patient adamantly refuses ambulance transport. She has Y says that she is willing to drive the patient over to [facility] for his admission. Patient was strongly advised that this is not safe and it is against the medical device of the emergency treatment team however, feel the patient  needs to be admitted and and concern that patient would leave AMA entirely if this mode of transport was not permitted.  Patient understands risks of worsening somnolence, arrhythmias, and death by allowing transport by personal vehicle. Patient is alert and oriented and feel patient is capable to make his own decision at this time.  Patient will directly to the [facility] emergent [facility] where he will be escorted to his room for admission. Patient given all documentation needed for this. Patient  and family had no other questions or concerns and patient was admitted in stable condition.    Final Clinical Impressions(s) / ED Diagnoses   Final diagnoses:  Hypercalcemia    I personally performed the services described in this documentation, which was scribed in my presence. The recorded information has been reviewed and is accurate.   Clinical Impression: 1. Hypercalcemia     Disposition: Admit to hospitalist service    Courtney Paris, MD 03/28/16 [phone]

## 2016-03-27 NOTE — Telephone Encounter (Signed)
Received phone call from nursing after hours regarding critical calcium level.  Attempted to reach patient at his listed number. No answer. Detailed voicemail left with call back number recommending immediate ER assessment and IV fluids to lower calcium level. Attempted to reach emergency contact but number has been disconnected. Will keep attempting to reach patient.

## 2016-03-27 NOTE — Telephone Encounter (Signed)
Spoke with patient and emergency contact (they are together at time of call). Patient with fatigue. Otherwise feeling fine. Giving critical calcium and HCC, sent to [facility] ER which is up the road for them for IV hydration and further assessment.   Updated emergency contacts phone number as the one is patient's chart was incorrect.  Spoke with ER provider to inform them of patient.

## 2016-03-28 ENCOUNTER — Encounter (HOSPITAL_COMMUNITY): Payer: Self-pay | Admitting: Internal Medicine

## 2016-03-28 ENCOUNTER — Ambulatory Visit: Payer: Managed Care, Other (non HMO) | Admitting: Hematology

## 2016-03-28 ENCOUNTER — Telehealth: Payer: Self-pay | Admitting: Physician Assistant

## 2016-03-28 ENCOUNTER — Telehealth: Payer: Self-pay | Admitting: *Deleted

## 2016-03-28 DIAGNOSIS — K7469 Other cirrhosis of liver: Secondary | ICD-10-CM

## 2016-03-28 DIAGNOSIS — N401 Enlarged prostate with lower urinary tract symptoms: Secondary | ICD-10-CM

## 2016-03-28 DIAGNOSIS — C220 Liver cell carcinoma: Secondary | ICD-10-CM

## 2016-03-28 DIAGNOSIS — R351 Nocturia: Secondary | ICD-10-CM

## 2016-03-28 DIAGNOSIS — R160 Hepatomegaly, not elsewhere classified: Secondary | ICD-10-CM

## 2016-03-28 HISTORY — DX: Liver cell carcinoma: C22.0

## 2016-03-28 LAB — COMPREHENSIVE METABOLIC PANEL
ALBUMIN: 2.9 g/dL — AB (ref 3.5–5.0)
ALK PHOS: 194 U/L — AB (ref 38–126)
ALT: 44 U/L (ref 17–63)
AST: 190 U/L — ABNORMAL HIGH (ref 15–41)
Anion gap: 6 (ref 5–15)
BILIRUBIN TOTAL: 2.7 mg/dL — AB (ref 0.3–1.2)
BUN: 8 mg/dL (ref 6–20)
CALCIUM: 13 mg/dL — AB (ref 8.9–10.3)
CO2: 24 mmol/L (ref 22–32)
Chloride: 107 mmol/L (ref 101–111)
Creatinine, Ser: 0.84 mg/dL (ref 0.61–1.24)
GFR calc Af Amer: >60 mL/min (ref >60)
Glucose, Bld: 155 mg/dL — ABNORMAL HIGH (ref 65–99)
Potassium: 4.4 mmol/L (ref 3.5–5.1)
Sodium: 137 mmol/L (ref 135–145)
TOTAL PROTEIN: 6.4 g/dL — AB (ref 6.5–8.1)

## 2016-03-28 LAB — HIV ANTIBODY (ROUTINE TESTING W REFLEX): HIV Screen 4th Generation wRfx: NONREACTIVE

## 2016-03-28 LAB — CBC
HCT: 40.3 % (ref 39.0–52.0)
Hemoglobin: 13.4 g/dL (ref 13.0–17.0)
MCH: 28.3 pg (ref 26.0–34.0)
MCHC: 33.3 g/dL (ref 30.0–36.0)
MCV: 85.2 fL (ref 78.0–100.0)
PLATELETS: 210 10*3/uL (ref 150–400)
RBC: 4.73 MIL/uL (ref 4.22–5.81)
RDW: 16.1 % — AB (ref 11.5–15.5)
WBC: 4.4 10*3/uL (ref 4.0–10.5)

## 2016-03-28 LAB — GLUCOSE, CAPILLARY
GLUCOSE-CAPILLARY: 135 mg/dL — AB (ref 65–99)
GLUCOSE-CAPILLARY: 166 mg/dL — AB (ref 65–99)
Glucose-Capillary: 129 mg/dL — ABNORMAL HIGH (ref 65–99)

## 2016-03-28 MED ORDER — INSULIN ASPART 100 UNIT/ML IV SOLN: 5.0000 [IU] | INTRAVENOUS | Status: AC

## 2016-03-28 MED ORDER — TAMSULOSIN HCL 0.4 MG PO CAPS
0.8000 mg | ORAL_CAPSULE | Freq: Every day | ORAL | Status: DC
  Administered 2016-03-28 – 2016-03-31 (×4): 0.8 mg via ORAL
  Filled 2016-03-28 (×4): qty 2

## 2016-03-28 MED ORDER — ONDANSETRON HCL 4 MG/2ML IJ SOLN: 4.0000 mg | Freq: Four times a day (QID) | INTRAMUSCULAR | Status: DC | PRN

## 2016-03-28 MED ORDER — SODIUM CHLORIDE 0.9 % IV SOLN
INTRAVENOUS | Status: DC
  Administered 2016-03-28 – 2016-03-30 (×4): via INTRAVENOUS

## 2016-03-28 MED ORDER — LOSARTAN POTASSIUM 50 MG PO TABS
50.0000 mg | ORAL_TABLET | Freq: Every day | ORAL | Status: DC
  Administered 2016-03-28 – 2016-03-31 (×4): 50 mg via ORAL
  Filled 2016-03-28 (×4): qty 1

## 2016-03-28 MED ORDER — INSULIN ASPART 100 UNIT/ML ~~LOC~~ SOLN: 0.0000 [IU] | Freq: Every day | SUBCUTANEOUS | Status: DC

## 2016-03-28 MED ORDER — ENOXAPARIN SODIUM 40 MG/0.4ML ~~LOC~~ SOLN
40.0000 mg | SUBCUTANEOUS | Status: DC
  Administered 2016-03-28 – 2016-03-29 (×2): 40 mg via SUBCUTANEOUS
  Filled 2016-03-28 (×2): qty 0.4

## 2016-03-28 MED ORDER — INSULIN ASPART 100 UNIT/ML ~~LOC~~ SOLN: 0.0000 [IU] | Freq: Three times a day (TID) | SUBCUTANEOUS | Status: DC

## 2016-03-28 MED ORDER — FLUTICASONE PROPIONATE 50 MCG/ACT NA SUSP
2.0000 | Freq: Every day | NASAL | Status: DC
  Filled 2016-03-28: qty 16

## 2016-03-28 MED ORDER — ONDANSETRON HCL 4 MG PO TABS: 4.0000 mg | ORAL_TABLET | Freq: Four times a day (QID) | ORAL | Status: DC | PRN

## 2016-03-28 MED ORDER — KETOCONAZOLE 2 % EX CREA
1.0000 "application " | TOPICAL_CREAM | Freq: Every day | CUTANEOUS | Status: DC
  Administered 2016-03-28 – 2016-03-29 (×2): 1 via TOPICAL
  Filled 2016-03-28: qty 15

## 2016-03-28 MED ORDER — LORATADINE 10 MG PO TABS: 10.0000 mg | ORAL_TABLET | Freq: Every day | ORAL | Status: DC | PRN

## 2016-03-28 MED ORDER — GLUCERNA SHAKE PO LIQD
237.0000 mL | Freq: Two times a day (BID) | ORAL | Status: DC
  Administered 2016-03-28 – 2016-03-29 (×2): 237 mL via ORAL
  Administered 2016-03-29: 1 via ORAL
  Administered 2016-03-30 – 2016-03-31 (×3): 237 mL via ORAL

## 2016-03-28 MED ORDER — INSULIN ASPART 100 UNIT/ML ~~LOC~~ SOLN
8.0000 [IU] | Freq: Three times a day (TID) | SUBCUTANEOUS | Status: DC
  Administered 2016-03-29: 2 [IU] via SUBCUTANEOUS

## 2016-03-28 MED ORDER — SODIUM CHLORIDE 0.9 % IV SOLN
4.0000 mg | Freq: Once | INTRAVENOUS | Status: AC
  Administered 2016-03-28: 4 mg via INTRAVENOUS
  Filled 2016-03-28: qty 5

## 2016-03-28 MED ORDER — ALBUTEROL SULFATE (2.5 MG/3ML) 0.083% IN NEBU: 2.5000 mg | INHALATION_SOLUTION | RESPIRATORY_TRACT | Status: DC | PRN

## 2016-03-28 NOTE — Telephone Encounter (Signed)
Spoke with patient and Peter Congo -- patient is currently admitted. They wanted to make sure that I am able to see notes from the hospital. Informed him that the doctors are sending me updates as his PCP but they are managing his care while in hospital and if he has certain questions, they will be the ones to answer until I see him for reassessment.

## 2016-03-28 NOTE — Progress Notes (Signed)
Pt refused his AM insulin. MD. Cathlean Sauer notified.

## 2016-03-28 NOTE — Progress Notes (Signed)
PROGRESS NOTE    Casey Reyes  [id number] DOB: [birth year] DOA: 03/27/2016 PCP: Leeanne Rio, PA-C     Brief Narrative:  61 yo male with chronic hep C and new diagnosed liver lesions per MRI, suspected malignancy but not confirmed. Presented with general weakness and hypercalcemia. Hemodynamic stable with no focal neuro deficits. Corrected Ca up to 14,4 on admission. Admitted for correction of hypercalcemia and further oncology evaluation.   Assessment & Plan:   Principal Problem:   Hypercalcemia Active Problems:   Essential hypertension   Diabetes mellitus type II, uncontrolled (H[facility])   Hepatitis C   BPH associated with nocturia   Hepatocellular carcinoma ([place])   1. Hypercalcemia. Corrected calcium at 13,8, will continue saline diuresis. Suspected hypercalcemia due to malignancy, will give zolendronic acid one dose and follow on calcium levels. Complete workup with PTH, PTH related peptide and vitamin D levels. Patient still weak on clinical examination.  2. Liver lesions. Suspected malignancy, will follow on further CT chest, abdomen and pelvis, follow on oncology recommendations.   3. Hep C. No current therapy, will need outpatient follow up.   4. T2DM. Patient refusing insulin, will increase range. Allow glucose up to 180.   5. HTN. Will continue on losartan. Systolic blood pressure [id number].    DVT prophylaxis: enoxaparin  Code Status: full  Family Communication: I spoke with patient's family at the bedside and all questions were addressed.  Disposition Plan: home.   Consultants:   Oncology   Procedures:    Antimicrobials:    Subjective: Patient with generalized weakness and ambulatory dysfunction, no nausea or vomiting, no fever or chills. Slow to respond to questions.   Objective: Vitals:   03/27/16 2239 03/27/16 2328 03/28/16 0048 03/28/16 0556  BP: 144/92 143/87 139/84 (!) 145/82  Pulse: 77 81 78 78  Resp: 23 24 18 18   Temp:   97.4 F (36.3 C)  97.6 F (36.4 C)  TempSrc:   Oral   SpO2: 100% 99% 100% 99%  Weight:      Height:        Intake/Output Summary (Last 24 hours) at 03/28/16 1045 Last data filed at 03/27/16 2239  Gross per 24 hour  Intake             2000 ml  Output                0 ml  Net             2000 ml   Filed Weights   03/27/16 1849  Weight: 105.7 kg (233 lb)    Examination:  General exam: deconditioned E ENT: positive pallor, no icterus, oral mucosa moist.  Respiratory system: Clear to auscultation. Respiratory effort normal. Mild deceased breath sounds.  Cardiovascular system: S1 & S2 heard, RRR. No JVD, murmurs, rubs, gallops or clicks. No pedal edema. Gastrointestinal system: Abdomen is nondistended, soft and nontender. No organomegaly or masses felt. Normal bowel sounds heard. Central nervous system: Alert and oriented, slow to respond to questions. No focal neurological deficits.  Extremities: Symmetric 5 x 5 power. Skin: No rashes, lesions or ulcers    Data Reviewed: I have personally reviewed following labs and imaging studies  CBC:  Recent Labs Lab 03/27/16 1900 03/28/16 0715  WBC 5.6 4.4  HGB 14.5 13.4  HCT 41.7 40.3  MCV 82.6 85.2  PLT 242 [id number]   Basic Metabolic Panel:  Recent Labs Lab 03/27/16 1238 03/27/16 1900 03/28/16 0715  NA 132* 136 137  K 4.3 4.3 4.4  CL 101 102 107  CO2 28 28 24   GLUCOSE 230* 219* 155*  BUN 12 12 8   CREATININE 0.98 1.04 0.84  CALCIUM 13.3* 13.6* 13.0*   GFR: Estimated Creatinine Clearance: 107 mL/min (by C-G formula based on SCr of 0.84 mg/dL). Liver Function Tests:  Recent Labs Lab 03/27/16 1238 03/27/16 2020 03/28/16 0715  AST 202* 202* 190*  ALT 47 47 44  ALKPHOS 216* 188* 194*  BILITOT 2.6* 2.4* 2.7*  PROT 7.2 7.3 6.4*  ALBUMIN 3.8 3.2* 2.9*   No results for input(s): LIPASE, AMYLASE in the last 168 hours. No results for input(s): AMMONIA in the last 168 hours. Coagulation Profile:  Recent Labs Lab 03/27/16 2020  INR  0.99   Cardiac Enzymes: No results for input(s): CKTOTAL, CKMB, CKMBINDEX, TROPONINI in the last 168 hours. BNP (last 3 results) No results for input(s): PROBNP in the last 8760 hours. HbA1C: No results for input(s): HGBA1C in the last 72 hours. CBG:  Recent Labs Lab 03/27/16 1855 03/28/16 0813  GLUCAP 222* 135*   Lipid Profile: No results for input(s): CHOL, HDL, LDLCALC, TRIG, CHOLHDL, LDLDIRECT in the last 72 hours. Thyroid Function Tests: No results for input(s): TSH, T4TOTAL, FREET4, T3FREE, THYROIDAB in the last 72 hours. Anemia Panel: No results for input(s): VITAMINB12, FOLATE, FERRITIN, TIBC, IRON, RETICCTPCT in the last 72 hours. Sepsis Labs:  Recent Labs Lab 03/27/16 2028  LATICACIDVEN 1.40    No results found for this or any previous visit (from the past 240 hour(s)).       Radiology Studies: No results found.      Scheduled Meds: . enoxaparin (LOVENOX) injection  40 mg Subcutaneous Q24H  . fluticasone  2 spray Each Nare Daily  . insulin aspart  0-5 Units Subcutaneous QHS  . insulin aspart  0-9 Units Subcutaneous TID WC  . insulin aspart  5 Units Intravenous STAT  . ketoconazole  1 application Topical Daily  . losartan  50 mg Oral Daily  . tamsulosin  0.8 mg Oral Daily  . zoledronic acid (ZOMETA) IV  4 mg Intravenous Once   Continuous Infusions: . sodium chloride       LOS: 1 day    Kannon Baum Gerome Apley, MD [facility] Pager [phone]  If 7PM-7AM, please contact night-coverage [email or web address] Password [email or web address] 03/28/2016, 10:45 AM

## 2016-03-28 NOTE — Progress Notes (Signed)
Paged the [facility] admissions regarding the missing orders for the patient

## 2016-03-28 NOTE — Consult Note (Addendum)
[place]  [phone]   HEMATOLOGY ONCOLOGY INPATIENT CONSULTATION   Casey Reyes  DOB: [birth year]  MR#: [id number]  CSN#: [id number]    Requesting Physician: [facility]  Patient Care Team: Brunetta Jeans, PA-C as PCP - General (Physician Assistant)  Reason for consult: liver masses  History of present illness: Casey Reyes is a 61 year old gentleman with past medical history of hypertension, type 2 diabetes, hepatitis C, untreated, was admitted yesterday for hypercalcemia. He was initially referred by his hematologist Heide Spark NP, and was scheduled to see me this morning in my clinic.   The patient, he has been doing well, fully functioning at home until about one week ago, he noticed dizziness, and imbalance. Family also noticed he has been more fatigued and is somnolent. He went to see his primary care physician yesterday, lab test showed hypercalcemia with calcium 11.0. He was sent to locally ED, and admitted to [facility] for hypercalcemia. He is alert, oriented, when I saw him this morning. Denies any pain OR discomfort.  He had a history of IV drug abuse, was diagnosed with hepatitis C many years ago, but was never treated. He was referred to [facility] earlier this year, a liver fibrotest test estimated F4 fibrosis. lab tests reviewed hepatitis C genotype 1A, AFP significant elevated at 22,992, so further abdominal MRI was obtained on 10/12/2016. The MRI showed multiple T1 hyperintense, minimally T2 hyperintense of large lesions distributed through both hepatic lobes with right lobe predominance. Arterial face was interrogated by motion artifact, making the [facility] diagnosis challenging. Per patient, he also had a CT scan by his primary care physician, but I'm not able to see in the Epic.  ROS was otherwise negative. He had one episode of black stool 7-8 months ago, no hematochezia. He never had a colonoscopy.     MEDICAL HISTORY:  Past Medical  History:  Diagnosis Date  . Chicken pox   . Diabetes mellitus without complication ([place])   . Hepatitis C   . Hepatocellular carcinoma ([place]) 03/28/2016  . Hypertension     SURGICAL HISTORY: Past Surgical History:  Procedure Laterality Date  . FINGER SURGERY     Right-Tendon Cut  . WISDOM TOOTH EXTRACTION      SOCIAL HISTORY: Social History   Social History  . Marital status: Divorced    Spouse name: N/A  . Number of children: N/A  . Years of education: N/A   Occupational History  . Not on file.   Social History Main Topics  . Smoking status: Current Some Day Smoker    Packs/day: 0.25    Types: Cigarettes  . Smokeless tobacco: Never Used  . Alcohol use Yes  . Drug use: No  . Sexual activity: Not on file   Other Topics Concern  . Not on file   Social History Narrative  . No narrative on file    FAMILY HISTORY: Family History  Problem Relation Age of Onset  . Heart attack Father     Deceased  . Heart disease Father   . Diabetes Mother     Living  . Heart attack Brother 65    Deceased  . Sudden death Brother   . Thyroid disease Sister   . Diabetes Sister   . Healthy Sister     x1  . Colon cancer Brother     x1  . Heart disease Maternal Grandfather   . Allergies Daughter   . Diabetes Son   .  Diabetes Maternal Grandmother     ALLERGIES:  is allergic to eovist [gadoxetate].  MEDICATIONS:  Current Facility-Administered Medications  Medication Dose Route Frequency Provider Last Rate Last Dose  . albuterol (PROVENTIL) (2.5 MG/3ML) 0.083% nebulizer solution 2.5 mg  2.5 mg Nebulization Q2H PRN Rondell A Tamala Julian, MD      . enoxaparin (LOVENOX) injection 40 mg  40 mg Subcutaneous Q24H Rondell A Tamala Julian, MD      . fluticasone (FLONASE) 50 MCG/ACT nasal spray 2 spray  2 spray Each Nare Daily Rondell A Tamala Julian, MD      . insulin aspart (novoLOG) injection 0-5 Units  0-5 Units Subcutaneous QHS Rondell A Smith, MD      . insulin aspart (novoLOG) injection 0-9 Units   0-9 Units Subcutaneous TID WC Rondell A Tamala Julian, MD      . insulin aspart (novoLOG) injection 5 Units  5 Units Intravenous STAT Rondell A Smith, MD      . ketoconazole (NIZORAL) 2 % cream 1 application  1 application Topical Daily Rondell A Tamala Julian, MD      . loratadine (CLARITIN) tablet 10 mg  10 mg Oral Daily PRN Norval Morton, MD      . losartan (COZAAR) tablet 50 mg  50 mg Oral Daily Rondell A Tamala Julian, MD      . ondansetron (ZOFRAN) tablet 4 mg  4 mg Oral Q6H PRN Norval Morton, MD       Or  . ondansetron (ZOFRAN) injection 4 mg  4 mg Intravenous Q6H PRN Rondell A Tamala Julian, MD      . tamsulosin (FLOMAX) capsule 0.8 mg  0.8 mg Oral Daily Rondell A Tamala Julian, MD        REVIEW OF SYSTEMS:   Constitutional: Denies fevers, chills or abnormal night sweats, (+) fatigue and insomnolence Eyes: Denies blurriness of vision, double vision or watery eyes Ears, nose, mouth, throat, and face: Denies mucositis or sore throat Respiratory: Denies cough, dyspnea or wheezes Cardiovascular: Denies palpitation, chest discomfort or lower extremity swelling Gastrointestinal:  Denies nausea, heartburn or change in bowel habits Skin: Denies abnormal skin rashes Lymphatics: Denies new lymphadenopathy or easy bruising Neurological:Denies numbness, tingling or new weaknesses, (+) imbalance  Behavioral/Psych: Mood is stable, no new changes  All other systems were reviewed with the patient and are negative.  PHYSICAL EXAMINATION: ECOG PERFORMANCE STATUS: 2 - Symptomatic, <50% confined to bed  Vitals:   03/28/16 0048 03/28/16 0556  BP: 139/84 (!) 145/82  Pulse: 78 78  Resp: 18 18  Temp: 97.4 F (36.3 C) 97.6 F (36.4 C)   Filed Weights   03/27/16 1849  Weight: 233 lb (105.7 kg)    GENERAL:alert, no distress and comfortable SKIN: skin color, texture, turgor are normal, no rashes or significant lesions EYES: normal, conjunctiva are pink and non-injected, sclera clear OROPHARYNX:no exudate, no erythema and lips,  buccal mucosa, and tongue normal  NECK: supple, thyroid normal size, non-tender, without nodularity LYMPH:  no palpable lymphadenopathy in the cervical, axillary or inguinal LUNGS: clear to auscultation and percussion with normal breathing effort HEART: regular rate & rhythm and no murmurs and no lower extremity edema ABDOMEN:abdomen soft, non-tender and normal bowel sounds Musculoskeletal:no cyanosis of digits and no clubbing  PSYCH: alert & oriented x 3 with fluent speech NEURO: no focal motor/sensory deficits  LABORATORY DATA:  I have reviewed the data as listed Lab Results  Component Value Date   WBC 4.4 03/28/2016   HGB 13.4 03/28/2016   HCT 40.3  03/28/2016   MCV 85.2 03/28/2016   PLT 210 03/28/2016    Recent Labs  02/08/16 1231 03/27/16 1238 03/27/16 1900 03/27/16 2020  NA 137 132* 136  --   K 3.9 4.3 4.3  --   CL 101 101 102  --   CO2 27 28 28   --   GLUCOSE 276* 230* 219*  --   BUN 11 12 12   --   CREATININE 0.80 0.98 1.04  --   CALCIUM 9.7 13.3* 13.6*  --   GFRNONAA  --   --  >60  --   GFRAA  --   --  >60  --   PROT 6.9 7.2  --  7.3  ALBUMIN 3.9 3.8  --  3.2*  AST 112* 202*  --  202*  ALT 70* 47  --  47  ALKPHOS 123* 216*  --  188*  BILITOT 0.6 2.6*  --  2.4*  BILIDIR  --   --   --  1.2*  IBILI  --   --   --  1.2*    RADIOGRAPHIC STUDIES: I have personally reviewed the radiological images as listed and agreed with the findings in the report. Dg Eye Foreign Body  Result Date: 03/14/2016 CLINICAL DATA:  Metal working/exposure; clearance prior to MRI EXAM: ORBITS FOR FOREIGN BODY - 2 VIEW COMPARISON:  None. FINDINGS: There is no evidence of metallic foreign body within the orbits. No significant bone abnormality identified. IMPRESSION: No evidence of metallic foreign body within the orbits. Electronically Signed   By: Lahoma Crocker M.D.   On: 03/14/2016 11:25   Mr Abdomen Wwo Contrast  Result Date: 03/14/2016 CLINICAL DATA:  Multiple liver lesions in a patient  with cirrhosis and elevated AFP. EXAM: MRI ABDOMEN WITHOUT AND WITH CONTRAST TECHNIQUE: Multiplanar multisequence MR imaging of the abdomen was performed both before and after the administration of intravenous contrast. CONTRAST:  10 cc Eovist COMPARISON:  Ultrasound exam 01/16/2014 FINDINGS: Lower chest:  Unremarkable. Hepatobiliary: Patient has multiple T1 hypointense, minimally T2 hyperintense relatively large lesions distributed through both hepatic lobes with a slight right lobe predominance. Some of these have variable signal intensity on T2 weighted imaging and others appear relatively homogeneous. Some of the lesion show scattered areas of central T1 shortening. After IV contrast administration, the arterial phase imaging is dramatically degraded by motion artifact making assessment for early phase enhancement limited. Later phases suggests are may be some heterogeneous enhancement and various lesions in both hepatic lobes. Some of the more dominant lesions do not definitely show arterial phase hyper enhancement. Lesion in the tip of the lateral segment left liver may be hypervascular. Liver parenchyma signal intensity is lower than blood pool on the hepatocellular phase imaging suggests an delay may not be adequate. Within this limitation, most of the lesions are heterogeneously hypo intense relative to background liver parenchyma and while this may be seen in the setting of [place], it is not specific. There is no evidence for gallstones, gallbladder wall thickening, or pericholecystic fluid. No intrahepatic or extrahepatic biliary dilation. Pancreas: No focal mass lesion. No dilatation of the main duct. No intraparenchymal cyst. No peripancreatic edema. Spleen: No splenomegaly. No focal mass lesion. Adrenals/Urinary Tract: No adrenal nodule or mass. Kidneys are unremarkable. Stomach/Bowel: Stomach is nondistended. No gastric wall thickening. No evidence of outlet obstruction. No bowel dilatation.  Vascular/Lymphatic: No abdominal aortic aneurysm. No evidence for lymphadenopathy. Other: No intraperitoneal free fluid. Musculoskeletal: No abnormal marrow signal within the visualized bony  anatomy. IMPRESSION: Multiple large heterogeneous lesions scattered throughout both hepatic lobes. Markedly degraded arterial phase imaging hinders assessment and while some of these may represent hepatomas, areas of confluent fibrosis or dysplastic nodules could image similarly. Consider dedicated multiphase abdominal CT to better assess hyperenhancement in these lesions as hepatocellular carcinoma is a concern. Electronically Signed   By: Misty Stanley M.D.   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  1. Hypercalcemia, possible related to malignancy 2. Liver masses, malignancy vs Hep C related, likely HCC, rule out metastatic malignancy  3. Untreated Hep C 4. Liver cirrhosis by Fibrotest, not impressive by MRI  5. HTN 6. Type 2 DM   Recommendations: -He needs CT CAP with contrast for further evaluation of his liver masses, MRI was not diagnostic due to motion degradation. He is significantly elevated AFP certainly makes [place] highly suspicious. Per pt, he had CT scans ordered by PCP, I will contact his PCP  -If CT is not definitive for [facility], I would need liver biopsy for tissue diagnosis -Hypercalcemia management per primary care team  -we briefly discussed [place] treatment options. Given the size and multiple focal disease, he is unlikely a candidate for surgical resection or liver transplant. We discussed the option of liver targeted therapy, such as chemoembolization or radio embolization. His total bilirubin is elevated, he may not be great candidate for Y90 or TACE we discussed the option of sorafenib and   immunotherapy Nivolumab. I'll finalized on his next office visit when we have definitive diagnosis.  -I will present his case in our GI tumor board next week. -I will arrange his f/u appointment with me in my clinic.    All questions were answered. The patient knows to call the clinic with any problems, questions or concerns.      Truitt Merle, MD 03/28/2016 8:33 AM  Addendum, My nurse called pt's PCP office and confirmed he did not have CT scan recently. Please consider CT CAP with contrast (liver cancer protocol) if possible when he is in the hospital. Thanks.   Truitt Merle  03/28/2016 10:48 AM

## 2016-03-28 NOTE — Progress Notes (Signed)
Paged the MD regarding the missing orders for the new patient

## 2016-03-28 NOTE — Telephone Encounter (Signed)
Patient's partner Peter Congo calling to request a call back from [place].  She states she has a few questions to ask and really would appreciate a call back when Einar Pheasant has a moment.

## 2016-03-28 NOTE — H&P (Signed)
History and Physical    Casey Reyes [id number] DOB: [birth year] DOA: 03/27/2016  Referring MD/NP/PA: Marda Stalker, MD PCP: Leeanne Rio, PA-C  Patient [facility] from: [facility]  Chief Complaint: Abnormal lab work  HPI: Casey Reyes is a 61 y.o. male with medical history significant of HTN, DM type 2, and hepatitis C; presented from PCP office for abnormal calcium of 11 mg/dL. patient complains of intermittent right-sided abdominal pain and generalized fatigue/weakness over the last week. His family was also noted to have recognize issues with patient's ability to balance while ambulating. Patient denies any falls, chest pain, change in appetite, diarrhea, constipation, or confusion. A MRI of abdomen obtained on 2/16, that revealed multiple hepatic lesions suggestive of hepatocellular carcinoma. It appears that the patient had been being worked up as an outpatient and had been scheduled an appointments to see Dr. Burr Medico this a.m. for further treatment options. However, yesterday after lab work he was instructed to come to the emergency department for further evaluation due to his calcium levels being elevated.   ED Course: Upon admission into the emergency department patient was seen have vital signs within normal limits. Lab work revealed calcium 13.6, glucose 219 , alkaline phosphatase 188, albumin 3.2, AST 202, indirect bilirubin 1.2 , and direct bilirubin 1.2. Patient was given 2 L of normal saline IV fluids at the emergency department. EKG without significant changes. [facility] called to admit for hypercalcemia.   Review of Systems: As per HPI otherwise 10 point review of systems negative.   Past Medical History:  Diagnosis Date  . Chicken pox   . Diabetes mellitus without complication ([place])   . Hepatitis C   . Hypertension     Past Surgical History:  Procedure Laterality Date  . FINGER SURGERY     Right-Tendon Cut  . WISDOM TOOTH EXTRACTION       reports that he has been smoking  Cigarettes.  He has been smoking about 0.25 packs per day. He has never used smokeless tobacco. He reports that he drinks alcohol. He reports that he does not use drugs.  Allergies  Allergen Reactions  . Eovist [Gadoxetate] Hives and Itching    Hives and itching over face and chest after 10 ml Eovist given. 50 mg Benadryl given and he was observed by nurse and Dr. Jeralyn Ruths until symptoms improved and he was released. ry    Family History  Problem Relation Age of Onset  . Heart attack Father     Deceased  . Heart disease Father   . Diabetes Mother     Living  . Heart attack Brother 58    Deceased  . Sudden death Brother   . Thyroid disease Sister   . Diabetes Sister   . Healthy Sister     x1  . Colon cancer Brother     x1  . Heart disease Maternal Grandfather   . Allergies Daughter   . Diabetes Son   . Diabetes Maternal Grandmother     Prior to Admission medications   Medication Sig Start Date End Date Taking? Authorizing Provider  empagliflozin (JARDIANCE) 10 MG TABS tablet Take 10 mg by mouth daily. 02/15/16  Yes Brunetta Jeans, PA-C  fluticasone (FLONASE) 50 MCG/ACT nasal spray Place 2 sprays into both nostrils daily. 03/27/16  Yes Brunetta Jeans, PA-C  glucose blood (ONETOUCH VERIO) test strip [employer]. Check Blood sugar once day. Dx: E119 06/04/15  Yes Brunetta Jeans, PA-C  ibuprofen (ADVIL,MOTRIN) 200 MG tablet Take  200 mg by mouth every 6 (six) hours as needed. Reported on 06/04/2015   Yes Historical Provider, MD  loratadine (CLARITIN) 10 MG tablet Take 1 tablet (10 mg total) by mouth daily as needed for allergies. 01/30/15  Yes Brunetta Jeans, PA-C  losartan (COZAAR) 50 MG tablet Take 1 tablet (50 mg total) by mouth daily. 02/08/16  Yes Brunetta Jeans, PA-C  tamsulosin (FLOMAX) 0.4 MG CAPS capsule Take 2 capsules (0.8 mg total) by mouth daily. 02/08/16  Yes Brunetta Jeans, PA-C  ketoconazole (NIZORAL) 2 % cream Apply 1 application topically daily. 02/27/16   Trula Slade, DPM    Physical Exam:    Constitutional: NAD, calm, comfortable Vitals:   03/27/16 2133 03/27/16 2239 03/27/16 2328 03/28/16 0048  BP:  144/92 143/87 139/84  Pulse: 74 77 81 78  Resp: (!) 27 23 24 18   Temp:    97.4 F (36.3 C)  TempSrc:    Oral  SpO2: 100% 100% 99% 100%  Weight:      Height:       Eyes: PERRL, lids and conjunctivae normal ENMT: Mucous membranes are moist. Posterior pharynx clear of any exudate or lesions.Normal dentition.  Neck: normal, supple, no masses, no thyromegaly Respiratory: clear to auscultation bilaterally, no wheezing, no crackles. Normal respiratory effort. No accessory muscle use.  Cardiovascular: Regular rate and rhythm, no murmurs / rubs / gallops. No extremity edema. 2+ pedal pulses. No carotid bruits.  Abdomen: no tenderness, no masses palpated. No hepatosplenomegaly. Bowel sounds positive.  Musculoskeletal: no clubbing / cyanosis. No joint deformity upper and lower extremities. Good ROM, no contractures. Normal muscle tone.  Skin: no rashes, lesions, ulcers. No induration Neurologic: CN 2-12 grossly intact. Sensation intact, DTR normal. Strength 5/5 in all 4.  Psychiatric: Normal judgment and insight. Alert and oriented x 3. Normal mood.     Labs on Admission: I have personally reviewed following labs and imaging studies  CBC:  Recent Labs Lab 03/27/16 1900  WBC 5.6  HGB 14.5  HCT 41.7  MCV 82.6  PLT [id number]   Basic Metabolic Panel:  Recent Labs Lab 03/27/16 1238 03/27/16 1900  NA 132* 136  K 4.3 4.3  CL 101 102  CO2 28 28  GLUCOSE 230* 219*  BUN 12 12  CREATININE 0.98 1.04  CALCIUM 13.3* 13.6*   GFR: Estimated Creatinine Clearance: 86.4 mL/min (by C-G formula based on SCr of 1.04 mg/dL). Liver Function Tests:  Recent Labs Lab 03/27/16 1238 03/27/16 2020  AST 202* 202*  ALT 47 47  ALKPHOS 216* 188*  BILITOT 2.6* 2.4*  PROT 7.2 7.3  ALBUMIN 3.8 3.2*   No results for input(s): LIPASE, AMYLASE in the last  168 hours. No results for input(s): AMMONIA in the last 168 hours. Coagulation Profile:  Recent Labs Lab 03/27/16 2020  INR 0.99   Cardiac Enzymes: No results for input(s): CKTOTAL, CKMB, CKMBINDEX, TROPONINI in the last 168 hours. BNP (last 3 results) No results for input(s): PROBNP in the last 8760 hours. HbA1C: No results for input(s): HGBA1C in the last 72 hours. CBG:  Recent Labs Lab 03/27/16 1855  GLUCAP 222*   Lipid Profile: No results for input(s): CHOL, HDL, LDLCALC, TRIG, CHOLHDL, LDLDIRECT in the last 72 hours. Thyroid Function Tests: No results for input(s): TSH, T4TOTAL, FREET4, T3FREE, THYROIDAB in the last 72 hours. Anemia Panel: No results for input(s): VITAMINB12, FOLATE, FERRITIN, TIBC, IRON, RETICCTPCT in the last 72 hours. Urine analysis:    Component  Value Date/Time   COLORURINE AMBER (A) 03/27/2016 2200   APPEARANCEUR CLEAR 03/27/2016 2200   LABSPEC 1.016 03/27/2016 2200   PHURINE 5.5 03/27/2016 2200   GLUCOSEU NEGATIVE 03/27/2016 2200   GLUCOSEU >=1000 (A) 02/08/2016 1231   HGBUR NEGATIVE 03/27/2016 2200   BILIRUBINUR SMALL (A) 03/27/2016 2200   KETONESUR NEGATIVE 03/27/2016 2200   PROTEINUR NEGATIVE 03/27/2016 2200   UROBILINOGEN 0.2 02/08/2016 1231   NITRITE NEGATIVE 03/27/2016 2200   LEUKOCYTESUR NEGATIVE 03/27/2016 2200   Sepsis Labs: No results found for this or any previous visit (from the past 240 hour(s)).   Radiological Exams on Admission: No results found.  EKG: Independently reviewed. Sinus rhythm  Assessment/Plan Hypercalcemia: Acute. Calcium moderately elevated at 13.6 on admission. Patient given 2 L of IV fluids while in the ED. - Admit to telemetry bed  - Continue normal saline IV fluids and 100 mL per hour - Recheck calcium  levels the same. - May likely need further work-up, but would consult with oncology first   Hepatocellular carcinoma with history of hepatitis C: Patient with MRI findings to suggest metastatic  carcinoma. - Contact Dr. Burr Medico for further recommendations  Diabetes mellitus type 2, uncontrolled: Patient's initial blood glucose was elevated at 220. It appears patient's last hemoglobin A1c was 10.6 on 02/08/2016. - Hypoglycemic protocol  - Held empaglifozin - CBGs every before meals and at bedtime with sensitive SSI  Essential hypertension - Continue losartan   BPH   - Continue Flomax DVT prophylaxis: Lovenox  Code Status: Full Family Communication: No family present at bedside Disposition Plan: Likely discharge home once medically stable Consults called: None Admission status: inpatient   Norval Morton MD [facility] Pager [phone]  If 7PM-7AM, please contact night-coverage [email or web address] Password [email or web address]  03/28/2016, 4:02 AM

## 2016-03-28 NOTE — Telephone Encounter (Signed)
"  Casey Reyes has been hospitalized.  His calcium level is too high.  Dr. Hassell Done was to call the office to let you all know so someone can come and see him."  Will notify provider.  Asked that they call office when discharged to reschedule today's appointment.    Currently in room [place] [phone]

## 2016-03-28 NOTE — Progress Notes (Signed)
Paged [facility] admissions regarding missing orders for the new patient

## 2016-03-28 NOTE — Progress Notes (Signed)
Initial Nutrition Assessment  DOCUMENTATION CODES:   Obesity unspecified  INTERVENTION:   -Glucerna Shake po BID, each supplement provides 220 kcal and 10 grams of protein  NUTRITION DIAGNOSIS:   Inadequate oral intake related to poor appetite as evidenced by per patient/family report, meal completion < 50%.  GOAL:   Patient will meet greater than or equal to 90% of their needs  MONITOR:   PO intake, Supplement acceptance, Labs, Weight trends, Skin, I & O's  REASON FOR ASSESSMENT:   Malnutrition Screening Tool    ASSESSMENT:   Casey Reyes is a 61 y.o. male with medical history significant of HTN, DM type 2, and hepatitis C; presented from PCP office for abnormal calcium of 11 mg/dL. patient complains of intermittent right-sided abdominal pain and generalized fatigue/weakness over the last week. His family was also noted to have recognize issues with patient's ability to balance while ambulating.   Pt admitted with acute hypercalcemia and hepatocellular carcinoma with hx of heptatitis C.   Per MD notes, MRI reveals possibility of metastatic disease. Reviewed oncology note from 03/28/16, pt is awaiting CT CAP and possible liver biopsy. Plan to discuss with tumor board and discuss interventions as an outpatient once definite diagnosis is obtained.   Hx obtained from pt and pt wife at bedside. Pt reports experiencing an 8# wt loss over the past 3-4 weeks (per wt hx, pt has experienced a 7# (2.9%) wt loss over the past month, which is not significant for time frame). Pt reports declining appetite over the past week; he generally consumes 5-6 small meals per day to assist with glycemic control, however, 1 week PTA he was eating "3 at best". Pt shares that he was previously intentionally trying to lose weight to assist with optimizing glycemic control. He shares that he follows a food chart provided to him by his physician and attempts to consume 30-45 grams of carbohydrate at each mini  meal. Per his report, CBGS typically run in the 130's. (Noted last Hgb A1c taken in 01/2016 was 10.6). Pt reports he takes glyburide at home, however, outpatient reports reveal pt takes 10 mg jardiance daily- neither pt or wife able to recall dosage. Pt also refused insulin administration at time of visit, despite attempts by this RD and RN to educate rationale for insulin usage to obtain tight glycemic control during hospitalization.   Pt did not eat breakfast this AM. Discussed importance of good meal completion to support healing. Pt is amenable to Glucerna supplements between meals, sharing he also consumes them at home during work breaks. Applauded pt for efforts to improve glycemic control and home and briefly reviewed carbohydrate counting with pt.   Nutrition-Focused physical exam completed. Findings are no fat depletion, mild muscle depletion (on calf and knee area only- likely due to decreased mobility PTA), and no edema.   Labs reviewed: CBGS: 135.  Diet Order:  Diet Carb Modified Fluid consistency: Thin; Room service appropriate? Yes  Skin:  Reviewed, no issues  Last BM:  03/28/16  Height:   Ht Readings from Last 1 Encounters:  03/27/16 5\' 7"  (1.702 m)    Weight:   Wt Readings from Last 1 Encounters:  03/27/16 233 lb (105.7 kg)    Ideal Body Weight:  67.3 kg  BMI:  Body mass index is 36.49 kg/m.  Estimated Nutritional Needs:   Kcal:  1900-2100  Protein:  100-115 grams  Fluid:  1.9-2.1 L  EDUCATION NEEDS:   Education needs addressed  Imajean Mcdermid A. Jimmye Norman,  RD, LDN, CDE Pager: [phone] After hours Pager: [phone]

## 2016-03-28 NOTE — Telephone Encounter (Signed)
I have sen him in [facility] this morning.   Truitt Merle MD

## 2016-03-28 NOTE — Progress Notes (Signed)
Casey Reyes 61 y.o male arrived to the unit via wheelchair from [facility] department.  Patient is alert and oriented x 4.  No complaints of pain. Vital signs stable.  IV to the right [facility] which is intact.  Skin assessment complete. No skin issues denitrified. Set up his cardiac monitoring.  Educated the patient on how to reach the staff on the floor. Activated the bed alarm.  Notified the [facility] admissions for orders for the new patient.  Will continue to monitor and update as needed

## 2016-03-29 ENCOUNTER — Inpatient Hospital Stay (HOSPITAL_COMMUNITY): Payer: 59

## 2016-03-29 ENCOUNTER — Encounter (HOSPITAL_COMMUNITY): Payer: Self-pay | Admitting: Radiology

## 2016-03-29 LAB — GLUCOSE, CAPILLARY
GLUCOSE-CAPILLARY: 245 mg/dL — AB (ref 65–99)
Glucose-Capillary: 100 mg/dL — ABNORMAL HIGH (ref 65–99)
Glucose-Capillary: 167 mg/dL — ABNORMAL HIGH (ref 65–99)
Glucose-Capillary: 226 mg/dL — ABNORMAL HIGH (ref 65–99)

## 2016-03-29 LAB — BASIC METABOLIC PANEL
ANION GAP: 8 (ref 5–15)
BUN: 7 mg/dL (ref 6–20)
CALCIUM: 13.3 mg/dL — AB (ref 8.9–10.3)
CO2: 25 mmol/L (ref 22–32)
Chloride: 102 mmol/L (ref 101–111)
Creatinine, Ser: 0.82 mg/dL (ref 0.61–1.24)
Glucose, Bld: 113 mg/dL — ABNORMAL HIGH (ref 65–99)
POTASSIUM: 3.8 mmol/L (ref 3.5–5.1)
SODIUM: 135 mmol/L (ref 135–145)

## 2016-03-29 MED ORDER — PREDNISONE 50 MG PO TABS
50.0000 mg | ORAL_TABLET | Freq: Four times a day (QID) | ORAL | Status: AC
Start: 2016-03-29 — End: 2016-03-30
  Administered 2016-03-29 (×2): 50 mg via ORAL
  Filled 2016-03-29: qty 1

## 2016-03-29 MED ORDER — ENOXAPARIN SODIUM 60 MG/0.6ML ~~LOC~~ SOLN
50.0000 mg | SUBCUTANEOUS | Status: DC
  Filled 2016-03-29 (×2): qty 0.6

## 2016-03-29 MED ORDER — DIPHENHYDRAMINE HCL 25 MG PO CAPS
50.0000 mg | ORAL_CAPSULE | Freq: Once | ORAL | Status: AC
  Administered 2016-03-29: 50 mg via ORAL
  Filled 2016-03-29: qty 2

## 2016-03-29 MED ORDER — IOPAMIDOL (ISOVUE-300) INJECTION 61%
INTRAVENOUS | Status: AC
  Administered 2016-03-29: 30 mL
  Filled 2016-03-29: qty 30

## 2016-03-29 MED ORDER — IOPAMIDOL (ISOVUE-300) INJECTION 61%
INTRAVENOUS | Status: AC
Start: 2016-03-29 — End: 2016-03-29
  Administered 2016-03-29: 100 mL
  Filled 2016-03-29: qty 100

## 2016-03-29 NOTE — Progress Notes (Signed)
Patient refusing insulin this morning, CBG was 100, provider notified that he will only take tablets.

## 2016-03-29 NOTE — Progress Notes (Signed)
CRITICAL VALUE ALERT  Critical value received:  Calcium 13.3   Date of notification:  03/29/2016  Time of notification:  [id number]  Critical value read back:Yes.    Nurse who received alert:  Tonette Lederer  MD notified (1st page): Dr. Olevia Bowens  Time of first page:  [phone]  MD notified (2nd page): Dr.  Olevia Bowens  Time of second page: [phone]  Responding MD:    Time MD responded:

## 2016-03-29 NOTE — Evaluation (Signed)
Physical Therapy Evaluation/ Discharge Patient Details Name: Casey Reyes MRN: [id number] DOB: 1955-09-08 Today's Date: 03/29/2016   History of Present Illness  61 yo admitted with hypercalcemia. PMHx: hep C, liver lesions, DM, HTN, BPH  Clinical Impression  Pt pleasant and moving well without report of dizziness, pain or difficulty with transfers. Pt reports he feels generally fatigued due to sleeping a lot and has not moved much recently. Pt with slow gait and response to questions but overall WFL for transfers mobility and gait and does not require further therapy at this time. Pt educated and encouraged to walk 3x/day. Will sign off with pt aware, agreeable and wife present.     Follow Up Recommendations No PT follow up    Equipment Recommendations  None recommended by PT    Recommendations for Other Services       Precautions / Restrictions Precautions Precautions: None Restrictions Weight Bearing Restrictions: No      Mobility  Bed Mobility Overal bed mobility: Modified Independent             General bed mobility comments: increased time  Transfers Overall transfer level: Modified independent                  Ambulation/Gait Ambulation/Gait assistance: Modified independent (Device/Increase time) Ambulation Distance (Feet): 500 Feet Assistive device: None Gait Pattern/deviations: Step-through pattern;Decreased stride length   Gait velocity interpretation: Below normal speed for age/gender General Gait Details: pt with decreased foot clearance which pt reports is baseline  Science writer    Modified Rankin (Stroke Patients Only)       Balance Overall balance assessment: No apparent balance deficits (not formally assessed)                                           Pertinent Vitals/Pain Pain Assessment: No/denies pain    Home Living Family/patient expects to be discharged to:: Private  residence Living Arrangements: Spouse/significant other Available Help at Discharge: Family;Available 24 hours/day Type of Home: House Home Access: Level entry     Home Layout: One level Home Equipment: None      Prior Function Level of Independence: Independent               Hand Dominance        Extremity/Trunk Assessment   Upper Extremity Assessment Upper Extremity Assessment: Overall WFL for tasks assessed    Lower Extremity Assessment Lower Extremity Assessment: Overall WFL for tasks assessed    Cervical / Trunk Assessment Cervical / Trunk Assessment: Normal  Communication   Communication: No difficulties  Cognition Arousal/Alertness: Awake/alert Behavior During Therapy: WFL for tasks assessed/performed Overall Cognitive Status: Within Functional Limits for tasks assessed                 General Comments: pt with slow responses but reports it is due to being tired, wife confirms    General Comments      Exercises     Assessment/Plan    PT Assessment Patent does not need any further PT services  PT Problem List         PT Treatment Interventions      PT Goals (Current goals can be found in the Care Plan section)  Acute Rehab PT Goals 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 PT Goal  Formulation: All assessment and education complete, DC therapy    Frequency     Barriers to discharge        Co-evaluation               End of Session   Activity Tolerance: Patient tolerated treatment well Patient left: in chair;with call bell/phone within reach;with family/visitor present Nurse Communication: Mobility status PT Visit Diagnosis: Other (comment) (asked to evaluate due to weakness)         Time: [id number] PT Time Calculation (min) (ACUTE ONLY): 25 min   Charges:   PT Evaluation $PT Eval Low Complexity: 1 Procedure     PT G Codes:         [name] [name] 04-17-16, 1:43 PM  [name],  [name]

## 2016-03-29 NOTE — Progress Notes (Signed)
PROGRESS NOTE    Casey Reyes  [id number] DOB: 30-Nov-1955 DOA: 03/27/2016 PCP: Leeanne Rio, PA-C    Brief Narrative:  61 yo male with chronic hep C and new diagnosed liver lesions per MRI, suspected malignancy but not confirmed. Presented with general weakness and hypercalcemia. Hemodynamic stable with no focal neuro deficits. Corrected Ca up to 14,4 on admission. Admitted for correction of hypercalcemia and further oncology evaluation. Patient received zolendronic acid, follow on ct chest, abdomen and pelvis.   Assessment & Plan:   Principal Problem:   Hypercalcemia Active Problems:   Essential hypertension   Diabetes mellitus type II, uncontrolled (HCC)   Hepatitis C   BPH associated with nocturia   Hepatocellular carcinoma ([place])    1. Hypercalcemia. Corrected calcium at 14,1, sp zolendronic acid yesterday, will continue isotonic saline IV and follow on serum calcium in am. Follow up on  PTH, PTH related peptide and vitamin D levels. Patient still weak on clinical examination, will get physical therapy evaluation and out off bed tid with meals.  2. Liver lesions. Suspected malignancy, will precede with further imaging with CT chest, abdomen and pelvis, contrast allergy, patient will need premedication, ordered radiology protocol.  3. Hep C. No current therapy.   4. T2DM. Continue to refuse insulin, will continue monitoring, capillary glucose 129, 100, 167.    5. HTN. Blood pressure control with losartan. Systolic blood pressure 123456.    DVT prophylaxis: enoxaparin  Code Status: full  Family Communication: I spoke with patient's family at the bedside and all questions were addressed.  Disposition Plan: home.   Consultants:   Oncology   Procedures:    Antimicrobials:   Subjective: No nausea or vomiting, persistent weakness, no nausea or vomiting. Mild abdominal pain at the right upper quadrant.   Objective: Vitals:   03/28/16 0556 03/28/16  1433 03/28/16 2200 03/29/16 0534  BP: (!) 145/82 138/72 (!) 147/87 (!) 142/85  Pulse: 78 79 76 84  Resp: 18 (!) 23 18 18   Temp: 97.6 F (36.4 C) 98.3 F (36.8 C) 98 F (36.7 C) 98 F (36.7 C)  TempSrc:  Oral Oral Oral  SpO2: 99% 100% 99% 100%  Weight:      Height:        Intake/Output Summary (Last 24 hours) at 03/29/16 0900 Last data filed at 03/28/16 1700  Gross per 24 hour  Intake              745 ml  Output                1 ml  Net              744 ml   Filed Weights   03/27/16 1849  Weight: 105.7 kg (233 lb)    Examination:  General exam: deconditioned E ENT: mild pallor, no icterus, oral mucosa moist.  Respiratory system: Clear to auscultation. Respiratory effort normal. No wheezing, rales or rhonchi.  Cardiovascular system: S1 & S2 heard, RRR. No JVD, murmurs, rubs, gallops or clicks. No pedal edema. Gastrointestinal system: Abdomen is nondistended, soft and nontender. No organomegaly or masses felt. Normal bowel sounds heard. Central nervous system: Alert and oriented. No focal neurological deficits. Extremities: Symmetric 5 x 5 power. Skin: No rashes, lesions or ulcers     Data Reviewed: I have personally reviewed following labs and imaging studies  CBC:  Recent Labs Lab 03/27/16 1900 03/28/16 0715  WBC 5.6 4.4  HGB 14.5 13.4  HCT 41.7 40.3  MCV 82.6 85.2  PLT 242 [id number]   Basic Metabolic Panel:  Recent Labs Lab 03/27/16 1238 03/27/16 1900 03/28/16 0715 03/29/16 0427  NA 132* 136 137 135  K 4.3 4.3 4.4 3.8  CL 101 102 107 102  CO2 28 28 24 25   GLUCOSE 230* 219* 155* 113*  BUN 12 12 8 7   CREATININE 0.98 1.04 0.84 0.82  CALCIUM 13.3* 13.6* 13.0* 13.3*   GFR: Estimated Creatinine Clearance: 109.6 mL/min (by C-G formula based on SCr of 0.82 mg/dL). Liver Function Tests:  Recent Labs Lab 03/27/16 1238 03/27/16 2020 03/28/16 0715  AST 202* 202* 190*  ALT 47 47 44  ALKPHOS 216* 188* 194*  BILITOT 2.6* 2.4* 2.7*  PROT 7.2 7.3 6.4*    ALBUMIN 3.8 3.2* 2.9*   No results for input(s): LIPASE, AMYLASE in the last 168 hours. No results for input(s): AMMONIA in the last 168 hours. Coagulation Profile:  Recent Labs Lab 03/27/16 2020  INR 0.99   Cardiac Enzymes: No results for input(s): CKTOTAL, CKMB, CKMBINDEX, TROPONINI in the last 168 hours. BNP (last 3 results) No results for input(s): PROBNP in the last 8760 hours. HbA1C: No results for input(s): HGBA1C in the last 72 hours. CBG:  Recent Labs Lab 03/27/16 1855 03/28/16 0813 03/28/16 1149 03/28/16 2159 03/29/16 0810  GLUCAP 222* 135* 166* 129* 100*   Lipid Profile: No results for input(s): CHOL, HDL, LDLCALC, TRIG, CHOLHDL, LDLDIRECT in the last 72 hours. Thyroid Function Tests: No results for input(s): TSH, T4TOTAL, FREET4, T3FREE, THYROIDAB in the last 72 hours. Anemia Panel: No results for input(s): VITAMINB12, FOLATE, FERRITIN, TIBC, IRON, RETICCTPCT in the last 72 hours. Sepsis Labs:  Recent Labs Lab 03/27/16 2028  LATICACIDVEN 1.40    No results found for this or any previous visit (from the past 240 hour(s)).       Radiology Studies: No results found.      Scheduled Meds: . diphenhydrAMINE  50 mg Oral Once  . enoxaparin (LOVENOX) injection  40 mg Subcutaneous Q24H  . feeding supplement (GLUCERNA SHAKE)  237 mL Oral BID BM  . fluticasone  2 spray Each Nare Daily  . insulin aspart  8 Units Subcutaneous TID WC  . ketoconazole  1 application Topical Daily  . losartan  50 mg Oral Daily  . predniSONE  50 mg Oral Q6H  . tamsulosin  0.8 mg Oral Daily   Continuous Infusions: . sodium chloride 100 mL/hr at 03/29/16 0044     LOS: 2 days      Tawni Millers, MD [facility] Pager [phone]  If 7PM-7AM, please contact night-coverage [email or web address] Password [email or web address] 03/29/2016, 9:00 AM

## 2016-03-30 LAB — BASIC METABOLIC PANEL
ANION GAP: 7 (ref 5–15)
BUN: 9 mg/dL (ref 6–20)
CALCIUM: 12.7 mg/dL — AB (ref 8.9–10.3)
CO2: 23 mmol/L (ref 22–32)
Chloride: 104 mmol/L (ref 101–111)
Creatinine, Ser: 0.91 mg/dL (ref 0.61–1.24)
GFR calc non Af Amer: >60 mL/min (ref >60)
Glucose, Bld: 190 mg/dL — ABNORMAL HIGH (ref 65–99)
Potassium: 4.2 mmol/L (ref 3.5–5.1)
SODIUM: 134 mmol/L — AB (ref 135–145)

## 2016-03-30 LAB — GLUCOSE, CAPILLARY
GLUCOSE-CAPILLARY: 149 mg/dL — AB (ref 65–99)
GLUCOSE-CAPILLARY: 185 mg/dL — AB (ref 65–99)
Glucose-Capillary: 171 mg/dL — ABNORMAL HIGH (ref 65–99)

## 2016-03-30 LAB — PARATHYROID HORMONE, INTACT (NO CA): PTH: 4 pg/mL — ABNORMAL LOW (ref 15–65)

## 2016-03-30 NOTE — Progress Notes (Signed)
PROGRESS NOTE    Casey Reyes  [id number] DOB: 1956-01-07 DOA: 03/27/2016 PCP: Leeanne Rio, PA-C    Brief Narrative:  61 yo male with chronic hep C and new diagnosed liver lesions per MRI, suspected malignancy but not confirmed. Presented with general weakness and hypercalcemia. Hemodynamic stable with no focal neuro deficits. Corrected Ca up to 14,4 on admission. Admitted for correction of hypercalcemia and further oncology evaluation. Patient received zolendronic acid, follow on ct chest, abdomen and pelvis.    Assessment & Plan:   Principal Problem:   Hypercalcemia Active Problems:   Essential hypertension   Diabetes mellitus type II, uncontrolled (HCC)   Hepatitis C   BPH associated with nocturia   Hepatocellular carcinoma ([place])  1. Hypercalcemia. Corrected calcium for albumin at 13,7, sp zolendronic acid x 1 dose. Ca trending down will hold on iv fluids for now. Pending PTH, PTH related peptide and vitamin D levels. Physical therapy evaluation with no home pt recommended.  2. Liver lesions. Suspected malignancy. CT chest, abdomen and pelvis, pending report, will follow with oncology recommendations, probably follow as outpatient.   3. Hep C. No current therapy. Will need outpatient follow up.    4. T2DM. Capillary glucose 226, 149, 185, coverage only above 200, patient has been refusing insulin therapy.   5. HTN. Blood pressure control with losartan. Systolic blood pressure [id number] to 140's.   Will plan to discharge in am, if continue to improve serum calcium. Follow with oncology as outpatient   DVT prophylaxis:enoxaparin  Code Status:full  Family Communication:I spoke with patient's family at the bedside and all questions were addressed.  Disposition Plan:home.   Consultants:  Oncology   Procedures:   Antimicrobials:    Subjective: Patient feeling better, still weak but able to ambulate with physical therapy, persistent lowe appetite,  no nausea or vomiting.   Objective: Vitals:   03/29/16 0534 03/29/16 1627 03/29/16 2115 03/30/16 0547  BP: (!) 142/85 (!) 141/81 138/81 135/87  Pulse: 84 90 84 85  Resp: 18 20 20 18   Temp: 98 F (36.7 C) 98 F (36.7 C) 98.1 F (36.7 C) 97.4 F (36.3 C)  TempSrc: Oral   Oral  SpO2: 100% 98% 98% 97%  Weight:      Height:        Intake/Output Summary (Last 24 hours) at 03/30/16 0908 Last data filed at 03/30/16 0659  Gross per 24 hour  Intake          3370.42 ml  Output                0 ml  Net          3370.42 ml   Filed Weights   03/27/16 1849  Weight: 105.7 kg (233 lb)    Examination:  General exam: deconditioned E ENT: mild pallor, no icterus, oral mucosa moist.   Respiratory system: Clear to auscultation. Respiratory effort normal. No wheezing, rales or rhonchi Cardiovascular system: S1 & S2 heard, RRR. No JVD, murmurs, rubs, gallops or clicks. trace edema. Gastrointestinal system: Abdomen is nondistended, soft and nontender. No organomegaly or masses felt. Normal bowel sounds heard. Central nervous system: Alert and oriented. No focal neurological deficits. Extremities: Symmetric 5 x 5 power. Skin: No rashes, lesions or ulcers     Data Reviewed: I have personally reviewed following labs and imaging studies  CBC:  Recent Labs Lab 03/27/16 1900 03/28/16 0715  WBC 5.6 4.4  HGB 14.5 13.4  HCT 41.7 40.3  MCV 82.6  85.2  PLT 242 [id number]   Basic Metabolic Panel:  Recent Labs Lab 03/27/16 1238 03/27/16 1900 03/28/16 0715 03/29/16 0427 03/30/16 0520  NA 132* 136 137 135 134*  K 4.3 4.3 4.4 3.8 4.2  CL 101 102 107 102 104  CO2 28 28 24 25 23   GLUCOSE 230* 219* 155* 113* 190*  BUN 12 12 8 7 9   CREATININE 0.98 1.04 0.84 0.82 0.91  CALCIUM 13.3* 13.6* 13.0* 13.3* 12.7*   GFR: Estimated Creatinine Clearance: 98.8 mL/min (by C-G formula based on SCr of 0.91 mg/dL). Liver Function Tests:  Recent Labs Lab 03/27/16 1238 03/27/16 2020 03/28/16 0715  AST  202* 202* 190*  ALT 47 47 44  ALKPHOS 216* 188* 194*  BILITOT 2.6* 2.4* 2.7*  PROT 7.2 7.3 6.4*  ALBUMIN 3.8 3.2* 2.9*   No results for input(s): LIPASE, AMYLASE in the last 168 hours. No results for input(s): AMMONIA in the last 168 hours. Coagulation Profile:  Recent Labs Lab 03/27/16 2020  INR 0.99   Cardiac Enzymes: No results for input(s): CKTOTAL, CKMB, CKMBINDEX, TROPONINI in the last 168 hours. BNP (last 3 results) No results for input(s): PROBNP in the last 8760 hours. HbA1C: No results for input(s): HGBA1C in the last 72 hours. CBG:  Recent Labs Lab 03/29/16 0810 03/29/16 1225 03/29/16 1720 03/29/16 2112 03/30/16 0747  GLUCAP 100* 167* 245* 226* 149*   Lipid Profile: No results for input(s): CHOL, HDL, LDLCALC, TRIG, CHOLHDL, LDLDIRECT in the last 72 hours. Thyroid Function Tests: No results for input(s): TSH, T4TOTAL, FREET4, T3FREE, THYROIDAB in the last 72 hours. Anemia Panel: No results for input(s): VITAMINB12, FOLATE, FERRITIN, TIBC, IRON, RETICCTPCT in the last 72 hours. Sepsis Labs:  Recent Labs Lab 03/27/16 2028  LATICACIDVEN 1.40    No results found for this or any previous visit (from the past 240 hour(s)).       Radiology Studies: No results found.      Scheduled Meds: . enoxaparin (LOVENOX) injection  50 mg Subcutaneous Q24H  . feeding supplement (GLUCERNA SHAKE)  237 mL Oral BID BM  . fluticasone  2 spray Each Nare Daily  . insulin aspart  8 Units Subcutaneous TID WC  . ketoconazole  1 application Topical Daily  . losartan  50 mg Oral Daily  . tamsulosin  0.8 mg Oral Daily   Continuous Infusions: . sodium chloride 75 mL/hr at 03/30/16 0540     LOS: 3 days        Mauricio Gerome Apley, MD [facility] Pager [phone]  If 7PM-7AM, please contact night-coverage [email or web address] Password [email or web address] 03/30/2016, 9:08 AM

## 2016-03-30 NOTE — Progress Notes (Signed)
Casey Reyes   DOB:01-28-1956   [id number]   [id number]  Oncology follow up   Subjective: Pt is feeling better, wife reports his mental confusion has improved, although not back to his normal status. He wants to go home. CT was done last night.    Objective:  Vitals:   03/29/16 2115 03/30/16 0547  BP: 138/81 135/87  Pulse: 84 85  Resp: 20 18  Temp: 98.1 F (36.7 C) 97.4 F (36.3 C)    Body mass index is 36.49 kg/m.  Intake/Output Summary (Last 24 hours) at 03/30/16 1446 Last data filed at 03/30/16 0659  Gross per 24 hour  Intake          3370.42 ml  Output                0 ml  Net          3370.42 ml     Sclerae unicteric  Oropharynx clear  No peripheral adenopathy  Lungs clear -- no rales or rhonchi  Heart regular rate and rhythm  Abdomen benign  MSK no focal spinal tenderness, no peripheral edema  Neuro nonfocal    CBG (last 3)   Recent Labs  03/29/16 2112 03/30/16 0747 03/30/16 1200  GLUCAP 226* 149* 185*     Labs:  Lab Results  Component Value Date   WBC 4.4 03/28/2016   HGB 13.4 03/28/2016   HCT 40.3 03/28/2016   MCV 85.2 03/28/2016   PLT 210 03/28/2016   NEUTROABS 2.9 01/16/2014   CMP Latest Ref Rng & Units 03/30/2016 03/29/2016 03/28/2016  Glucose 65 - 99 mg/dL 190(H) 113(H) 155(H)  BUN 6 - 20 mg/dL 9 7 8   Creatinine 0.61 - 1.24 mg/dL 0.91 0.82 0.84  Sodium 135 - 145 mmol/L 134(L) 135 137  Potassium 3.5 - 5.1 mmol/L 4.2 3.8 4.4  Chloride 101 - 111 mmol/L 104 102 107  CO2 22 - 32 mmol/L 23 25 24   Calcium 8.9 - 10.3 mg/dL 12.7(H) 13.3(HH) 13.0(H)  Total Protein 6.5 - 8.1 g/dL - - 6.4(L)  Total Bilirubin 0.3 - 1.2 mg/dL - - 2.7(H)  Alkaline Phos 38 - 126 U/L - - 194(H)  AST 15 - 41 U/L - - 190(H)  ALT 17 - 63 U/L - - 44     Urine Studies No results for input(s): UHGB, CRYS in the last 72 hours.  Invalid input(s): UACOL, UAPR, USPG, UPH, UTP, UGL, UKET, UBIL, UNIT, UROB, [place], UEPI, UWBC, Duwayne Heck [place], [place]  Basic Metabolic  Panel:  Recent Labs Lab 03/27/16 1238 03/27/16 1900 03/28/16 0715 03/29/16 0427 03/30/16 0520  NA 132* 136 137 135 134*  K 4.3 4.3 4.4 3.8 4.2  CL 101 102 107 102 104  CO2 28 28 24 25 23   GLUCOSE 230* 219* 155* 113* 190*  BUN 12 12 8 7 9   CREATININE 0.98 1.04 0.84 0.82 0.91  CALCIUM 13.3* 13.6* 13.0* 13.3* 12.7*   GFR Estimated Creatinine Clearance: 98.8 mL/min (by C-G formula based on SCr of 0.91 mg/dL). Liver Function Tests:  Recent Labs Lab 03/27/16 1238 03/27/16 2020 03/28/16 0715  AST 202* 202* 190*  ALT 47 47 44  ALKPHOS 216* 188* 194*  BILITOT 2.6* 2.4* 2.7*  PROT 7.2 7.3 6.4*  ALBUMIN 3.8 3.2* 2.9*   No results for input(s): LIPASE, AMYLASE in the last 168 hours. No results for input(s): AMMONIA in the last 168 hours. Coagulation profile  Recent Labs Lab 03/27/16 2020  INR 0.99  CBC:  Recent Labs Lab 03/27/16 1900 03/28/16 0715  WBC 5.6 4.4  HGB 14.5 13.4  HCT 41.7 40.3  MCV 82.6 85.2  PLT 242 210   Cardiac Enzymes: No results for input(s): CKTOTAL, CKMB, CKMBINDEX, TROPONINI in the last 168 hours. BNP: Invalid input(s): POCBNP CBG:  Recent Labs Lab 03/29/16 1225 03/29/16 1720 03/29/16 2112 03/30/16 0747 03/30/16 1200  GLUCAP 167* 245* 226* 149* 185*   D-Dimer No results for input(s): DDIMER in the last 72 hours. Hgb A1c No results for input(s): HGBA1C in the last 72 hours. Lipid Profile No results for input(s): CHOL, HDL, LDLCALC, TRIG, CHOLHDL, LDLDIRECT in the last 72 hours. Thyroid function studies No results for input(s): TSH, T4TOTAL, T3FREE, THYROIDAB in the last 72 hours.  Invalid input(s): FREET3 Anemia work up No results for input(s): VITAMINB12, FOLATE, FERRITIN, TIBC, IRON, RETICCTPCT in the last 72 hours. Microbiology No results found for this or any previous visit (from the past 240 hour(s)).    Studies:  Ct Abdomen Pelvis W Wo Contrast  Result Date: 03/30/2016 CLINICAL DATA:  Evaluate liver lesions seen  on recent MRI knee. EXAM: CT CHEST WITH CONTRAST CT ABDOMEN AND PELVIS WITH AND WITHOUT CONTRAST TECHNIQUE: Multidetector CT imaging of the chest was performed during intravenous contrast administration. Multidetector CT imaging of the abdomen and pelvis was performed following the standard protocol before and during bolus administration of intravenous contrast. CONTRAST:  121mL ISOVUE-300 IOPAMIDOL (ISOVUE-300) INJECTION 61% COMPARISON:  MRI abdomen 03/14/2016 FINDINGS: CT CHEST FINDINGS Chest wall: No chest wall mass, supraclavicular or axillary lymphadenopathy. The thyroid gland appears normal. Cardiovascular: The heart is normal in size. No pericardial effusion. There is tortuosity, ectasia and calcification of the thoracic aorta. No dissection or focal aneurysm. This mild fusiform dilatation of the descending thoracic aorta with maximum measurement of 3.3 cm. Age advanced fairly extensive three-vessel coronary artery calcifications. Mediastinum/Nodes: No mediastinal or hilar mass or lymphadenopathy. The esophagus is grossly normal. Lungs/Pleura: No acute pulmonary findings. No worrisome pulmonary lesions. Musculoskeletal: No significant bony findings. CT ABDOMEN AND PELVIS FINDINGS Hepatobiliary: Advanced cirrhotic changes involving the liver with portal venous hypertension and portal venous collaterals. There are numerous large masses in the liver as demonstrated on the recent MRI most likely reflecting multifocal hepatoma is. 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 The main portal vein may be partially thrombosed also. The left portal vein appears patent. Early cavernous transformation with numerous small portal collaterals. The lesions are more distinct and better measured on the MRI scan. Pancreas: No mass, inflammation or ductal dilatation. Spleen: Normal size.  No focal lesions. Adrenals/Urinary Tract: The adrenal glands and  kidneys are unremarkable. Stomach/Bowel: The stomach, duodenum, small bowel and colon are grossly normal. No inflammatory changes, mass lesions or obstructive findings. The terminal ileum is normal. The appendix is normal. Vascular/Lymphatic: Enlarged periportal and hepatic duodenum ligament lymph nodes. The largest node measures 12 mm on image number 63. Scattered aortic and branch vessel calcifications. No aneurysm or dissection. The branch vessels are patent. Reproductive: Enlarged prostate gland with median lobe hypertrophy impresses on the base the bladder. No bladder mass or asymmetric wall thickening. Dense calcifications of the vas deferens are noted. Other: No pelvic mass or adenopathy. No free pelvic fluid collections. No inguinal mass or adenopathy. No abdominal wall hernia or subcutaneous lesions. Musculoskeletal: No significant bony findings. IMPRESSION: 1. Cirrhosis with portal hypertension and portal venous collaterals. 2. Numerous large  confluent hepatic masses consistent with hepatomas. 3. 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 There is early cavernous transformation and a bizarre enhancement pattern of the liver because of it. 4. Periportal adenopathy. 5. No significant findings in the chest. No evidence of pulmonary metastatic disease. 6. Age advanced three-vessel coronary artery calcifications. Electronically Signed   By: Marijo Sanes M.D.   On: 03/30/2016 14:10   Ct Chest W Contrast  Result Date: 03/30/2016 CLINICAL DATA:  Evaluate liver lesions seen on recent MRI knee. EXAM: CT CHEST WITH CONTRAST CT ABDOMEN AND PELVIS WITH AND WITHOUT CONTRAST TECHNIQUE: Multidetector CT imaging of the chest was performed during intravenous contrast administration. Multidetector CT imaging of the abdomen and pelvis was performed following the standard protocol before and during bolus administration of intravenous contrast. CONTRAST:   161mL ISOVUE-300 IOPAMIDOL (ISOVUE-300) INJECTION 61% COMPARISON:  MRI abdomen 03/14/2016 FINDINGS: CT CHEST FINDINGS Chest wall: No chest wall mass, supraclavicular or axillary lymphadenopathy. The thyroid gland appears normal. Cardiovascular: The heart is normal in size. No pericardial effusion. There is tortuosity, ectasia and calcification of the thoracic aorta. No dissection or focal aneurysm. This mild fusiform dilatation of the descending thoracic aorta with maximum measurement of 3.3 cm. Age advanced fairly extensive three-vessel coronary artery calcifications. Mediastinum/Nodes: No mediastinal or hilar mass or lymphadenopathy. The esophagus is grossly normal. Lungs/Pleura: No acute pulmonary findings. No worrisome pulmonary lesions. Musculoskeletal: No significant bony findings. CT ABDOMEN AND PELVIS FINDINGS Hepatobiliary: Advanced cirrhotic changes involving the liver with portal venous hypertension and portal venous collaterals. There are numerous large masses in the liver as demonstrated on the recent MRI most likely reflecting multifocal hepatoma is. 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 The main portal vein may be partially thrombosed also. The left portal vein appears patent. Early cavernous transformation with numerous small portal collaterals. The lesions are more distinct and better measured on the MRI scan. Pancreas: No mass, inflammation or ductal dilatation. Spleen: Normal size.  No focal lesions. Adrenals/Urinary Tract: The adrenal glands and kidneys are unremarkable. Stomach/Bowel: The stomach, duodenum, small bowel and colon are grossly normal. No inflammatory changes, mass lesions or obstructive findings. The terminal ileum is normal. The appendix is normal. Vascular/Lymphatic: Enlarged periportal and hepatic duodenum ligament lymph nodes. The largest node measures 12 mm on image number 63. Scattered aortic and  branch vessel calcifications. No aneurysm or dissection. The branch vessels are patent. Reproductive: Enlarged prostate gland with median lobe hypertrophy impresses on the base the bladder. No bladder mass or asymmetric wall thickening. Dense calcifications of the vas deferens are noted. Other: No pelvic mass or adenopathy. No free pelvic fluid collections. No inguinal mass or adenopathy. No abdominal wall hernia or subcutaneous lesions. Musculoskeletal: No significant bony findings. IMPRESSION: 1. Cirrhosis with portal hypertension and portal venous collaterals. 2. Numerous large confluent hepatic masses consistent with hepatomas. 3. 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 There is early cavernous transformation and a bizarre enhancement pattern of the liver because of it. 4. Periportal adenopathy. 5. No significant findings in the chest. No evidence of pulmonary metastatic disease. 6. Age advanced three-vessel coronary artery calcifications. Electronically Signed   By: Marijo Sanes M.D.   On: 03/30/2016 14:10    Assessment: 61 y.o.  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  1. Hypercalcemia, possible related to malignancy 2. Liver masses, malignancy vs Hep C related, likely HCC, rule out metastatic malignancy  3. Untreated Hep C 4. Liver cirrhosis by Fibrotest, not impressive by MRI  5. HTN 6. Type 2 DM  Plan:  -His hypercalcium has improved after zometa, but not close to normal, may consider calcitonin  -CT scan reviewed, it showed advanced cirrhotic changes, numerous large confluent hepatic masses consistent with hepatomas. (+) portal thrombosis, (+) periportal adenopathy, no distant mets.  -Based on CT, I do not think he needs tissue biopsy for now, will review  with radiology and my other colleagues in our GI tumor board next Wednesday -I will see him back in my clinic in 3-5 days to finalize his treatment plan -please repeat his LFT in the morning -OK to discharge home if hypercalcemia improves, from my standpoint.     Truitt Merle, MD 03/30/2016  2:46 PM

## 2016-03-31 DIAGNOSIS — J209 Acute bronchitis, unspecified: Secondary | ICD-10-CM

## 2016-03-31 DIAGNOSIS — E1165 Type 2 diabetes mellitus with hyperglycemia: Secondary | ICD-10-CM

## 2016-03-31 DIAGNOSIS — B1920 Unspecified viral hepatitis C without hepatic coma: Secondary | ICD-10-CM

## 2016-03-31 DIAGNOSIS — K7460 Unspecified cirrhosis of liver: Secondary | ICD-10-CM

## 2016-03-31 DIAGNOSIS — E1142 Type 2 diabetes mellitus with diabetic polyneuropathy: Secondary | ICD-10-CM

## 2016-03-31 DIAGNOSIS — I10 Essential (primary) hypertension: Secondary | ICD-10-CM

## 2016-03-31 LAB — BASIC METABOLIC PANEL
ANION GAP: 3 — AB (ref 5–15)
BUN: 12 mg/dL (ref 6–20)
CO2: 26 mmol/L (ref 22–32)
CREATININE: 0.94 mg/dL (ref 0.61–1.24)
Calcium: 11.2 mg/dL — ABNORMAL HIGH (ref 8.9–10.3)
Chloride: 107 mmol/L (ref 101–111)
GFR calc non Af Amer: >60 mL/min (ref >60)
Glucose, Bld: 92 mg/dL (ref 65–99)
POTASSIUM: 3.6 mmol/L (ref 3.5–5.1)
SODIUM: 136 mmol/L (ref 135–145)

## 2016-03-31 LAB — HEPATIC FUNCTION PANEL
ALBUMIN: 3 g/dL — AB (ref 3.5–5.0)
ALK PHOS: 163 U/L — AB (ref 38–126)
ALT: 42 U/L (ref 17–63)
AST: 245 U/L — ABNORMAL HIGH (ref 15–41)
BILIRUBIN DIRECT: 1.2 mg/dL — AB (ref 0.1–0.5)
BILIRUBIN TOTAL: 2.2 mg/dL — AB (ref 0.3–1.2)
Indirect Bilirubin: 1 mg/dL — ABNORMAL HIGH (ref 0.3–0.9)
Total Protein: 6.2 g/dL — ABNORMAL LOW (ref 6.5–8.1)

## 2016-03-31 LAB — VITAMIN D 25 HYDROXY (VIT D DEFICIENCY, FRACTURES): Vit D, 25-Hydroxy: 14.9 ng/mL — ABNORMAL LOW (ref 30.0–100.0)

## 2016-03-31 LAB — GLUCOSE, CAPILLARY
GLUCOSE-CAPILLARY: 97 mg/dL (ref 65–99)
Glucose-Capillary: 135 mg/dL — ABNORMAL HIGH (ref 65–99)
Glucose-Capillary: 85 mg/dL (ref 65–99)

## 2016-03-31 MED ORDER — ALBUTEROL SULFATE (2.5 MG/3ML) 0.083% IN NEBU: 2.5000 mg | INHALATION_SOLUTION | RESPIRATORY_TRACT | 12 refills | Status: AC | PRN

## 2016-03-31 MED ORDER — NEBULIZER COMPRESSOR KIT: 1.0000 | PACK | Freq: Four times a day (QID) | 0 refills | Status: DC | PRN

## 2016-03-31 NOTE — Discharge Summary (Signed)
Physician Discharge Summary  Casey Reyes [id number] DOB: 11/29/1955 DOA: 03/27/2016  PCP: Leeanne Rio, PA-C  Admit date: 03/27/2016 Discharge date: 03/31/2016  Admitted From: Home  Disposition:  Home   Recommendations for Outpatient Follow-up:  1. Follow up with PCP in 1- week 2. Oncology with Dr. Burr Medico 3. Follow calcium levels in 5 days with Dr. Burr Medico 4. Pending result of PTH related peptide and vitamin D levels.  Home Health: No  Equipment/Devices: No   Discharge Condition: Stable  CODE STATUS: full  Diet recommendation: Heart Healthy / Carb Modified   Brief/Interim Summary: This is a 61 year old male who presented to the hospital with a chief complaint of a abnormal blood work. Symptoms associated with confusion and hematuria dysfunction. Recently diagnosed with multiple hepatic lesions per MRI, outpatient blood pressure hypercalcemia, patient was referred for further admission to the hospital. On initial physical examination his blood pressure 144/92, heart rate 77, respiratory rate 23, oxygen saturation 100%. His mucosal membranes were moist, his lungs were clear auscultation bilaterally, heart S1-S2 present rhythmic, abdomen soft nontender, straight is no edema. Sodium 136, potassium 4.3, chloride 102, bicarbonate 28, glucose 219, BUN 12, creatinine 1.04, calcium 13.6, albumin 3.2, white count 5.6, hemoglobin 14.5, hematocrit 41.7, platelets 242. Urinalysis negative for infection. EKG was normal sinus rhythm with normal PR interval.  The patient was admitted to the hospital with symptomatic hypercalcemia suspected to be malignancy related.  1. Hypercalcemia. The patient was admitted to the medical floor with remote telemetry monitor, patient received aggressive hydration with saline intravenously, he received Zometa 1. His calcium has been improving as well as his symptoms, discharge calcium 11.2, corrected for albumin 12. Physical therapy evaluation with recommendations for  nor further therapy. Patient will follow-up with oncology Dr. Burr Medico in 3-5 days, a repeat calcium is recommended at that point. PTH 4 consistent with normal physiologic response to hypercalcemia. Pending at [facility] related peptide as well as 25-hydroxy vitamin D levels.  2. Numerous large hepatomas, complicated by portal vein thrombosis. Further workup with CT of abdomen, pelvis and chest showed numerous large confluent hepatic masses consistent with hepatomas. Thrombosed middle and right portal veins and distal aspect of the main portal vein. 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 No clinical signs of encephalopathy. LFTs showed AST of 245, ALT 42. Total bilirubin's 2.2. Will follow-up as an outpatient with Dr. Burr Medico in the oncology clinic. Patient has history of hepatitis C, high risk for hepatocellular carcinoma. Hold on anticoagulation until further evaluation as an outpatient.  3. Type 2 diabetes mellitus. Patient was placed on insulin sliding-scale for glucose coverage and monitoring, capillary glucose remained well-controlled, he will resume oral hypoglycemic agents discharge.   4. Hypertension. Blood pressure remained well-controlled, he will continue taking losartan.  5. Hepatitis C. Will need follow-up as an outpatient.      Discharge Diagnoses:  Principal Problem:   Hypercalcemia Active Problems:   Essential hypertension   Diabetes mellitus type II, uncontrolled (HCC)   Hepatitis C   BPH associated with nocturia   Hepatocellular carcinoma ([place])    Discharge Instructions   Allergies as of 03/31/2016      Reactions   Gadolinium Derivatives Hives, Itching   Eovist [gadoxetate] Hives, Itching   Hives and itching over face and chest after 10 ml Eovist given. 50 mg Benadryl given and he was observed by nurse and Dr. Jeralyn Ruths until symptoms improved and he was released. ry      Medication List  STOP taking these medications   ibuprofen 200 MG  tablet Commonly known as:  ADVIL,MOTRIN     TAKE these medications   albuterol (2.5 MG/3ML) 0.083% nebulizer solution Commonly known as:  PROVENTIL Take 3 mLs (2.5 mg total) by nebulization every 2 (two) hours as needed for wheezing.   empagliflozin 10 MG Tabs tablet Commonly known as:  JARDIANCE Take 10 mg by mouth daily. What changed:  how much to take   fluticasone 50 MCG/ACT nasal spray Commonly known as:  FLONASE Place 2 sprays into both nostrils daily. What changed:  when to take this  reasons to take this   ketoconazole 2 % cream Commonly known as:  NIZORAL Apply 1 application topically daily. What changed:  when to take this  reasons to take this   loratadine 10 MG tablet Commonly known as:  CLARITIN Take 1 tablet (10 mg total) by mouth daily as needed for allergies.   losartan 50 MG tablet Commonly known as:  COZAAR Take 1 tablet (50 mg total) by mouth daily.   omeprazole 20 MG tablet Commonly known as:  PRILOSEC OTC Take 20 mg by mouth daily.   pregabalin 100 MG capsule Commonly known as:  LYRICA Take 100 mg by mouth 2 (two) times daily.   tamsulosin 0.4 MG Caps capsule Commonly known as:  FLOMAX Take 2 capsules (0.8 mg total) by mouth daily.       Allergies  Allergen Reactions  . Gadolinium Derivatives Hives and Itching  . Eovist [Gadoxetate] Hives and Itching    Hives and itching over face and chest after 10 ml Eovist given. 50 mg Benadryl given and he was observed by nurse and Dr. Jeralyn Ruths until symptoms improved and he was released. ry    Consultations:   Oncology    Procedures/Studies: Ct Abdomen Pelvis W Wo Contrast  Result Date: 03/30/2016 CLINICAL DATA:  Evaluate liver lesions seen on recent MRI knee. EXAM: CT CHEST WITH CONTRAST CT ABDOMEN AND PELVIS WITH AND WITHOUT CONTRAST TECHNIQUE: Multidetector CT imaging of the chest was performed during intravenous contrast administration. Multidetector CT imaging of the abdomen and pelvis  was performed following the standard protocol before and during bolus administration of intravenous contrast. CONTRAST:  129mL ISOVUE-300 IOPAMIDOL (ISOVUE-300) INJECTION 61% COMPARISON:  MRI abdomen 03/14/2016 FINDINGS: CT CHEST FINDINGS Chest wall: No chest wall mass, supraclavicular or axillary lymphadenopathy. The thyroid gland appears normal. Cardiovascular: The heart is normal in size. No pericardial effusion. There is tortuosity, ectasia and calcification of the thoracic aorta. No dissection or focal aneurysm. This mild fusiform dilatation of the descending thoracic aorta with maximum measurement of 3.3 cm. Age advanced fairly extensive three-vessel coronary artery calcifications. Mediastinum/Nodes: No mediastinal or hilar mass or lymphadenopathy. The esophagus is grossly normal. Lungs/Pleura: No acute pulmonary findings. No worrisome pulmonary lesions. Musculoskeletal: No significant bony findings. CT ABDOMEN AND PELVIS FINDINGS Hepatobiliary: Advanced cirrhotic changes involving the liver with portal venous hypertension and portal venous collaterals. There are numerous large masses in the liver as demonstrated on the recent MRI most likely reflecting multifocal hepatoma is. 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 The main portal vein may be partially thrombosed also. The left portal vein appears patent. Early cavernous transformation with numerous small portal collaterals. The lesions are more distinct and better measured on the MRI scan. Pancreas: No mass, inflammation or ductal dilatation. Spleen: Normal size.  No focal lesions. Adrenals/Urinary Tract: The adrenal  glands and kidneys are unremarkable. Stomach/Bowel: The stomach, duodenum, small bowel and colon are grossly normal. No inflammatory changes, mass lesions or obstructive findings. The terminal ileum is normal. The appendix is normal. Vascular/Lymphatic: Enlarged  periportal and hepatic duodenum ligament lymph nodes. The largest node measures 12 mm on image number 63. Scattered aortic and branch vessel calcifications. No aneurysm or dissection. The branch vessels are patent. Reproductive: Enlarged prostate gland with median lobe hypertrophy impresses on the base the bladder. No bladder mass or asymmetric wall thickening. Dense calcifications of the vas deferens are noted. Other: No pelvic mass or adenopathy. No free pelvic fluid collections. No inguinal mass or adenopathy. No abdominal wall hernia or subcutaneous lesions. Musculoskeletal: No significant bony findings. IMPRESSION: 1. Cirrhosis with portal hypertension and portal venous collaterals. 2. Numerous large confluent hepatic masses consistent with hepatomas. 3. 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 There is early cavernous transformation and a bizarre enhancement pattern of the liver because of it. 4. Periportal adenopathy. 5. No significant findings in the chest. No evidence of pulmonary metastatic disease. 6. Age advanced three-vessel coronary artery calcifications. Electronically Signed   By: Marijo Sanes M.D.   On: 03/30/2016 14:10   Dg Eye Foreign Body  Result Date: 03/14/2016 CLINICAL DATA:  Metal working/exposure; clearance prior to MRI EXAM: ORBITS FOR FOREIGN BODY - 2 VIEW COMPARISON:  None. FINDINGS: There is no evidence of metallic foreign body within the orbits. No significant bone abnormality identified. IMPRESSION: No evidence of metallic foreign body within the orbits. Electronically Signed   By: Lahoma Crocker M.D.   On: 03/14/2016 11:25   Ct Chest W Contrast  Result Date: 03/30/2016 CLINICAL DATA:  Evaluate liver lesions seen on recent MRI knee. EXAM: CT CHEST WITH CONTRAST CT ABDOMEN AND PELVIS WITH AND WITHOUT CONTRAST TECHNIQUE: Multidetector CT imaging of the chest was performed during intravenous contrast  administration. Multidetector CT imaging of the abdomen and pelvis was performed following the standard protocol before and during bolus administration of intravenous contrast. CONTRAST:  119mL ISOVUE-300 IOPAMIDOL (ISOVUE-300) INJECTION 61% COMPARISON:  MRI abdomen 03/14/2016 FINDINGS: CT CHEST FINDINGS Chest wall: No chest wall mass, supraclavicular or axillary lymphadenopathy. The thyroid gland appears normal. Cardiovascular: The heart is normal in size. No pericardial effusion. There is tortuosity, ectasia and calcification of the thoracic aorta. No dissection or focal aneurysm. This mild fusiform dilatation of the descending thoracic aorta with maximum measurement of 3.3 cm. Age advanced fairly extensive three-vessel coronary artery calcifications. Mediastinum/Nodes: No mediastinal or hilar mass or lymphadenopathy. The esophagus is grossly normal. Lungs/Pleura: No acute pulmonary findings. No worrisome pulmonary lesions. Musculoskeletal: No significant bony findings. CT ABDOMEN AND PELVIS FINDINGS Hepatobiliary: Advanced cirrhotic changes involving the liver with portal venous hypertension and portal venous collaterals. There are numerous large masses in the liver as demonstrated on the recent MRI most likely reflecting multifocal hepatoma is. 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 The main portal vein may be partially thrombosed also. The left portal vein appears patent. Early cavernous transformation with numerous small portal collaterals. The lesions are more distinct and better measured on the MRI scan. Pancreas: No mass, inflammation or ductal dilatation. Spleen: Normal size.  No focal lesions. Adrenals/Urinary Tract: The adrenal glands and kidneys are unremarkable. Stomach/Bowel: The stomach, duodenum, small bowel and colon are grossly normal. No inflammatory changes, mass lesions or  obstructive findings. The terminal ileum is  normal. The appendix is normal. Vascular/Lymphatic: Enlarged periportal and hepatic duodenum ligament lymph nodes. The largest node measures 12 mm on image number 63. Scattered aortic and branch vessel calcifications. No aneurysm or dissection. The branch vessels are patent. Reproductive: Enlarged prostate gland with median lobe hypertrophy impresses on the base the bladder. No bladder mass or asymmetric wall thickening. Dense calcifications of the vas deferens are noted. Other: No pelvic mass or adenopathy. No free pelvic fluid collections. No inguinal mass or adenopathy. No abdominal wall hernia or subcutaneous lesions. Musculoskeletal: No significant bony findings. IMPRESSION: 1. Cirrhosis with portal hypertension and portal venous collaterals. 2. Numerous large confluent hepatic masses consistent with hepatomas. 3. 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 There is early cavernous transformation and a bizarre enhancement pattern of the liver because of it. 4. Periportal adenopathy. 5. No significant findings in the chest. No evidence of pulmonary metastatic disease. 6. Age advanced three-vessel coronary artery calcifications. Electronically Signed   By: Marijo Sanes M.D.   On: 03/30/2016 14:10   Mr Abdomen Wwo Contrast  Result Date: 03/14/2016 CLINICAL DATA:  Multiple liver lesions in a patient with cirrhosis and elevated AFP. EXAM: MRI ABDOMEN WITHOUT AND WITH CONTRAST TECHNIQUE: Multiplanar multisequence MR imaging of the abdomen was performed both before and after the administration of intravenous contrast. CONTRAST:  10 cc Eovist COMPARISON:  Ultrasound exam 01/16/2014 FINDINGS: Lower chest:  Unremarkable. Hepatobiliary: Patient has multiple T1 hypointense, minimally T2 hyperintense relatively large lesions distributed through both hepatic lobes with a slight right lobe predominance. Some of these have variable signal intensity on T2  weighted imaging and others appear relatively homogeneous. Some of the lesion show scattered areas of central T1 shortening. After IV contrast administration, the arterial phase imaging is dramatically degraded by motion artifact making assessment for early phase enhancement limited. Later phases suggests are may be some heterogeneous enhancement and various lesions in both hepatic lobes. Some of the more dominant lesions do not definitely show arterial phase hyper enhancement. Lesion in the tip of the lateral segment left liver may be hypervascular. Liver parenchyma signal intensity is lower than blood pool on the hepatocellular phase imaging suggests an delay may not be adequate. Within this limitation, most of the lesions are heterogeneously hypo intense relative to background liver parenchyma and while this may be seen in the setting of [place], it is not specific. There is no evidence for gallstones, gallbladder wall thickening, or pericholecystic fluid. No intrahepatic or extrahepatic biliary dilation. Pancreas: No focal mass lesion. No dilatation of the main duct. No intraparenchymal cyst. No peripancreatic edema. Spleen: No splenomegaly. No focal mass lesion. Adrenals/Urinary Tract: No adrenal nodule or mass. Kidneys are unremarkable. Stomach/Bowel: Stomach is nondistended. No gastric wall thickening. No evidence of outlet obstruction. No bowel dilatation. Vascular/Lymphatic: No abdominal aortic aneurysm. No evidence for lymphadenopathy. Other: No intraperitoneal free fluid. Musculoskeletal: No abnormal marrow signal within the visualized bony anatomy. IMPRESSION: Multiple large heterogeneous lesions scattered throughout both hepatic lobes. Markedly degraded arterial phase imaging hinders assessment and while some of these may represent hepatomas, areas of confluent fibrosis or dysplastic nodules could image similarly. Consider dedicated multiphase abdominal CT to better assess hyperenhancement in these lesions  as hepatocellular carcinoma is a concern. Electronically Signed   By: Misty Stanley M.D.   On: 03/14/2016 16:19       Subjective: Patient feeling better, improved strength and  balance. No nausea or vomiting. No abdominal pain.   Discharge Exam: Vitals:   03/30/16 2129 03/31/16 0529  BP: 134/77 139/83  Pulse: 85 88  Resp: 18 20  Temp: 98.1 F (36.7 C) 97.3 F (36.3 C)   Vitals:   03/30/16 1454 03/30/16 1502 03/30/16 2129 03/31/16 0529  BP: (!) 97/51 113/69 134/77 139/83  Pulse: 80 86 85 88  Resp: 18  18 20   Temp: 97.3 F (36.3 C)  98.1 F (36.7 C) 97.3 F (36.3 C)  TempSrc: Oral   Oral  SpO2: 99%  100% 100%  Weight:      Height:        General: Pt is alert, awake, not in acute distress Cardiovascular: RRR, S1/S2 +, no rubs, no gallops Respiratory: CTA bilaterally, no wheezing, no rhonchi Abdominal: Soft, NT, ND, bowel sounds + Extremities: no edema, no cyanosis    The results of significant diagnostics from this hospitalization (including imaging, microbiology, ancillary and laboratory) are listed below for reference.     Microbiology: No results found for this or any previous visit (from the past 240 hour(s)).   Labs: BNP (last 3 results) No results for input(s): BNP in the last 8760 hours. Basic Metabolic Panel:  Recent Labs Lab 03/27/16 1900 03/28/16 0715 03/29/16 0427 03/30/16 0520 03/31/16 0446  NA 136 137 135 134* 136  K 4.3 4.4 3.8 4.2 3.6  CL 102 107 102 104 107  CO2 28 24 25 23 26   GLUCOSE 219* 155* 113* 190* 92  BUN 12 8 7 9 12   CREATININE 1.04 0.84 0.82 0.91 0.94  CALCIUM 13.6* 13.0* 13.3* 12.7* 11.2*   Liver Function Tests:  Recent Labs Lab 03/27/16 1238 03/27/16 2020 03/28/16 0715 03/31/16 0739  AST 202* 202* 190* 245*  ALT 47 47 44 42  ALKPHOS 216* 188* 194* 163*  BILITOT 2.6* 2.4* 2.7* 2.2*  PROT 7.2 7.3 6.4* 6.2*  ALBUMIN 3.8 3.2* 2.9* 3.0*   No results for input(s): LIPASE, AMYLASE in the last 168 hours. No results  for input(s): AMMONIA in the last 168 hours. CBC:  Recent Labs Lab 03/27/16 1900 03/28/16 0715  WBC 5.6 4.4  HGB 14.5 13.4  HCT 41.7 40.3  MCV 82.6 85.2  PLT 242 210   Cardiac Enzymes: No results for input(s): CKTOTAL, CKMB, CKMBINDEX, TROPONINI in the last 168 hours. BNP: Invalid input(s): POCBNP CBG:  Recent Labs Lab 03/29/16 2112 03/30/16 0747 03/30/16 1200 03/30/16 1700 03/31/16 0749  GLUCAP 226* 149* 185* 171* 97   D-Dimer No results for input(s): DDIMER in the last 72 hours. Hgb A1c No results for input(s): HGBA1C in the last 72 hours. Lipid Profile No results for input(s): CHOL, HDL, LDLCALC, TRIG, CHOLHDL, LDLDIRECT in the last 72 hours. Thyroid function studies No results for input(s): TSH, T4TOTAL, T3FREE, THYROIDAB in the last 72 hours.  Invalid input(s): FREET3 Anemia work up No results for input(s): VITAMINB12, FOLATE, FERRITIN, TIBC, IRON, RETICCTPCT in the last 72 hours. Urinalysis    Component Value Date/Time   COLORURINE AMBER (A) 03/27/2016 2200   APPEARANCEUR CLEAR 03/27/2016 2200   LABSPEC 1.016 03/27/2016 2200   PHURINE 5.5 03/27/2016 2200   GLUCOSEU NEGATIVE 03/27/2016 2200   GLUCOSEU >=1000 (A) 02/08/2016 1231   HGBUR NEGATIVE 03/27/2016 2200   BILIRUBINUR SMALL (A) 03/27/2016 2200   KETONESUR NEGATIVE 03/27/2016 2200   PROTEINUR NEGATIVE 03/27/2016 2200   UROBILINOGEN 0.2 02/08/2016 1231   NITRITE NEGATIVE 03/27/2016 2200   LEUKOCYTESUR NEGATIVE 03/27/2016 2200  Sepsis Labs Invalid input(s): PROCALCITONIN,  WBC,  LACTICIDVEN Microbiology No results found for this or any previous visit (from the past 240 hour(s)).   Time coordinating discharge: 45 minutes  SIGNED:   Tawni Millers, MD  [facility] 03/31/2016, 8:58 AM Pager   If 7PM-7AM, please contact night-coverage [email or web address] Password [email or web address]

## 2016-03-31 NOTE — Progress Notes (Signed)
Patient was discharged home by MD order; discharged instructions  review and give to patient and his wife with care notes; prescription was sent to pharmacy; IV DIC;  patient will be escorted to the car by a volunteer via wheelchair.

## 2016-04-01 ENCOUNTER — Other Ambulatory Visit: Payer: Self-pay | Admitting: Hematology

## 2016-04-01 ENCOUNTER — Telehealth: Payer: Self-pay

## 2016-04-01 DIAGNOSIS — C220 Liver cell carcinoma: Secondary | ICD-10-CM

## 2016-04-01 NOTE — Telephone Encounter (Signed)
Reviewed discharge. Patient needs to schedule FU with Dr. Burr Medico (Oncology) this week per discharge. I could not see where he had an appointment. Please reach out to patient.

## 2016-04-01 NOTE — Telephone Encounter (Signed)
Spoke with patient and partner Peter Congo).    Transition Care Management Follow-up Telephone Call   Date discharged? 03/31/2016   How have you been since you were released from the hospital? "i'm not okay. My stomach is a little upset" Peter Congo states patient is "doing a lot better today."    Do you understand why you were in the hospital? yes   Do you understand the discharge instructions? yes   Where were you discharged to? Home   Items Reviewed:  Medications reviewed: no. Patient and partner requested to review at time of office visit.   Allergies reviewed: yes  Dietary changes reviewed: yes, heart healthy, diabetic diet discussed  Referrals reviewed: no   Functional Questionnaire:   Activities of Daily Living (ADLs):   He states they are independent in the following: ambulation, bathing and hygiene, feeding, continence, grooming, toileting and dressing States they require assistance with the following: None   Any transportation issues/concerns?: no   Any patient concerns? Questions regarding breathing treatment supplies. Order has been placed by discharging provider, message sent to D[facility] ([facility]) to f/u.    Confirmed importance and date/time of follow-up visits scheduled yes  Provider Appointment booked with PCP 04/08/16 @ 11:30.  Confirmed with patient if condition begins to worsen call PCP or go to the ER.  Patient was given the office number and encouraged to call back with question or concerns.  : yes

## 2016-04-01 NOTE — Telephone Encounter (Signed)
Advised patient and partner Peter Congo) they will be contacted by [facility] regarding nebulizer equipment. Patient has contacted Dr. Ernestina Penna office for appointment (pt was hospitalized at time of initial appointment), waiting for their return call. Will follow.

## 2016-04-02 ENCOUNTER — Telehealth: Payer: Self-pay | Admitting: Hematology

## 2016-04-02 ENCOUNTER — Telehealth: Payer: Self-pay | Admitting: Physician Assistant

## 2016-04-02 NOTE — Telephone Encounter (Signed)
Just received call back from [facility] with Dr. Christain [place] office, Pt has a hosp follow up appt scheduled for tomorrow 3/8 at 11:30.

## 2016-04-02 NOTE — Telephone Encounter (Signed)
Received call from [place] office/Angela requesting call back regarding hospital f/u within 3 days.  Called & informed that pt is scheduled for tomorrow @ 11:30 am.

## 2016-04-02 NOTE — Telephone Encounter (Signed)
Per msg from Dr. Burr Medico, a hosp f/u has been scheduled for the pt to see her on 3/8 at 1130am. Aware to arrive 30 minutes early. Pt agreed to the appt date and time.

## 2016-04-02 NOTE — Telephone Encounter (Signed)
Noted! Thank you

## 2016-04-02 NOTE — Telephone Encounter (Signed)
Called pt to make aware that a follow up was needed, pt states that they have called Dr. Christain [place] office. I called Dr. Christain [place] office and left message, waiting on call back.

## 2016-04-02 NOTE — Telephone Encounter (Signed)
They need to call Dr. Ernestina Penna office and schedule a hospital FU and that they were told to be seen in 3 days. If they cannot see them in that time, he needs to come to the lab either here at [place] for some repeat blood work as calcium recheck is needed.

## 2016-04-02 NOTE — Progress Notes (Signed)
[place]  [phone] [phone]  Clinic Follow up Note   Patient Care Team: Brunetta Jeans, PA-C as PCP - General (Physician Assistant) 04/05/2016  SUMMARY OF ONCOLOGIC HISTORY: Oncology History   Cancer Staging Hepatocellular carcinoma [facility]) Staging form: Liver, AJCC 8th Edition - Clinical stage from 03/14/2016: Stage IIIA (cT3(m), cN0, cM0) - Signed by Truitt Merle, MD on 04/03/2016       Hepatocellular carcinoma ([place])   03/14/2016 Initial Diagnosis    Hepatocellular carcinoma ([place])      03/14/2016 Imaging    Abdominal MRI with and without contrast showed multiple large heterogeneous lesions scattered throughout both hepatic lobes, markedly degraded arterial phase image makes the diagnosis of [place] less certain. Area of confluent fibrosis or dysplastic nodules could image similarly.      03/30/2016 Imaging    CT chest, abdomen and pelvis with contrast showed liver cirrhosis with portal hypertension, numerous large confluent hepatic metastasis consistent with hepatomas. 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 No distant metastasis.       04/03/2016 Tumor Marker    AFP 160,321 ng/ml      History of present illness (03/28/2016): Mr. Casey Reyes is a 61 year old gentleman with past medical history of hypertension, type 2 diabetes, hepatitis C, untreated, was admitted yesterday for hypercalcemia. He was initially referred by his hematologist Heide Spark NP, and was scheduled to see me this morning in my clinic.   The patient, he has been doing well, fully functioning at home until about one week ago, he noticed dizziness, and imbalance. Family also noticed he has been more fatigued and is somnolent. He went to see his primary care physician yesterday, lab test showed hypercalcemia with calcium 11.0. He was sent to locally ED, and admitted to [facility] for hypercalcemia.  He is alert, oriented, when I saw him this morning. Denies any pain OR discomfort.  He had a history of IV drug abuse, was diagnosed with hepatitis C many years ago, but was never treated. He was referred to [facility] earlier this year, a liver fibrotest test estimated F4 fibrosis. lab tests reviewed hepatitis C genotype 1A, AFP significant elevated at 22,992, so further abdominal MRI was obtained on 10/12/2016. The MRI showed multiple T1 hyperintense, minimally T2 hyperintense of large lesions distributed through both hepatic lobes with right lobe predominance. Arterial face was interrogated by motion artifact, making the [facility] diagnosis challenging. Per patient, he also had a CT scan by his primary care physician, but I'm not able to see in the Epic.  ROS was otherwise negative. He had one episode of black stool 7-8 months ago, no hematochezia. He never had a colonoscopy.    CURRENT THERAPY: pending   INTERVAL HISTORY: The patient returns for follow up with his wife. The patient was recently hospitalized for hypercalcemia. He reports he is feeling better and stronger. His appetite is returning. He reports some pain to his abdomen. The patient questions what dietary changes he should make to manage his hypercalcemia.  Recent CT scan showed cirrhosis with portal hypertension with numerous large confluent hepatic masses consistent with hepatomas. This is suspicious for hepatocellular carcinoma.  REVIEW OF SYSTEMS:   Constitutional: Denies fevers, chills or abnormal weight loss (+) improved appetite Eyes: Denies blurriness of vision Ears, nose, mouth, throat, and face: Denies mucositis or sore throat Respiratory: Denies cough, dyspnea or wheezes Cardiovascular: Denies palpitation, chest discomfort or lower  extremity swelling Gastrointestinal:  Denies nausea, heartburn or change in bowel habits Abdomen: (+) mild abdominal pain Skin: Denies abnormal skin rashes Lymphatics: Denies new lymphadenopathy or  easy bruising Neurological:Denies numbness, tingling or new weaknesses Behavioral/Psych: Mood is stable, no new changes  All other systems were reviewed with the patient and are negative.  MEDICAL HISTORY:  Past Medical History:  Diagnosis Date  . Chicken pox   . Diabetes mellitus without complication ([place])   . Hepatitis C   . Hepatocellular carcinoma ([place]) 03/28/2016  . Hypertension     SURGICAL HISTORY: Past Surgical History:  Procedure Laterality Date  . FINGER SURGERY     Right-Tendon Cut  . WISDOM TOOTH EXTRACTION      I have reviewed the social history and family history with the patient and they are unchanged from previous note.  ALLERGIES:  is allergic to gadolinium derivatives and eovist [gadoxetate].  MEDICATIONS:  Current Outpatient Prescriptions  Medication Sig Dispense Refill  . albuterol (PROVENTIL) (2.5 MG/3ML) 0.083% nebulizer solution Take 3 mLs (2.5 mg total) by nebulization every 2 (two) hours as needed for wheezing. 75 mL 12  . empagliflozin (JARDIANCE) 10 MG TABS tablet Take 10 mg by mouth daily. (Patient taking differently: Take 5 mg by mouth daily. ) 30 tablet 1  . fluticasone (FLONASE) 50 MCG/ACT nasal spray Place 2 sprays into both nostrils daily. (Patient taking differently: Place 2 sprays into both nostrils daily as needed for allergies. ) 16 g 6  . ketoconazole (NIZORAL) 2 % cream Apply 1 application topically daily. (Patient taking differently: Apply 1 application topically daily as needed for irritation. ) 60 g 2  . loratadine (CLARITIN) 10 MG tablet Take 1 tablet (10 mg total) by mouth daily as needed for allergies. 30 tablet 5  . losartan (COZAAR) 50 MG tablet Take 1 tablet (50 mg total) by mouth daily. 90 tablet 0  . omeprazole (PRILOSEC OTC) 20 MG tablet Take 20 mg by mouth daily.    . pregabalin (LYRICA) 100 MG capsule Take 100 mg by mouth 2 (two) times daily.    Marland Kitchen Respiratory Therapy Supplies (NEBULIZER COMPRESSOR) KIT 1 kit by Does not apply  route every 6 (six) hours as needed. 1 each 0  . tamsulosin (FLOMAX) 0.4 MG CAPS capsule Take 2 capsules (0.8 mg total) by mouth daily. 60 capsule 1   No current facility-administered medications for this visit.     PHYSICAL EXAMINATION:  ECOG PERFORMANCE STATUS: 2 - Symptomatic, <50% confined to bed  Vitals:   04/03/16 1202  BP: 122/75  Pulse: 88  Resp: 18  Temp: 97.7 F (36.5 C)   Filed Weights   04/03/16 1202  Weight: 234 lb 12.8 oz (106.5 kg)    GENERAL:alert, no distress and comfortable SKIN: skin color, texture, turgor are normal, no rashes or significant lesions EYES: normal, Conjunctiva are pink and non-injected, sclera clear OROPHARYNX:no exudate, no erythema and lips, buccal mucosa, and tongue normal  NECK: supple, thyroid normal size, non-tender, without nodularity LYMPH:  no palpable lymphadenopathy in the cervical, axillary or inguinal LUNGS: clear to auscultation and percussion with normal breathing effort HEART: regular rate & rhythm and no murmurs and no lower extremity edema ABDOMEN:abdomen soft, non-tender and normal bowel sounds Musculoskeletal:no cyanosis of digits and no clubbing  NEURO: alert & oriented x 3 with fluent speech, no focal motor/sensory deficits  LABORATORY DATA:  I have reviewed the data as listed CBC Latest Ref Rng & Units 04/03/2016 03/28/2016 03/27/2016  WBC 4.0 -  10.3 10e3/uL 4.8 4.4 5.6  Hemoglobin 13.0 - 17.1 g/dL 13.5 13.4 14.5  Hematocrit 38.4 - 49.9 % 39.4 40.3 41.7  Platelets 140 - 400 10e3/uL 229 210 242     CMP Latest Ref Rng & Units 04/03/2016 03/31/2016 03/30/2016  Glucose 70 - 140 mg/dl 68(L) 92 190(H)  BUN 7.0 - 26.0 mg/dL 20._0 Creatinine 0.7 - 1.3 mg/dL 2.2(H) 0.94 0.91  Sodium 136 - 145 mEq/L 141 136 134(L)  Potassium 3.5 - 5.1 mEq/L 3.9 3.6 4.2  Chloride 101 - 111 mmol/L - 107 104  CO2 22 - 29 mEq/L _1 Calcium 8.4 - 10.4 mg/dL 11.4(H) 11.2(H) 12.7(H)  Total Protein 6.4 - 8.3 g/dL 7.7 6.2(L) -  Total  Bilirubin 0.20 - 1.20 mg/dL 2.71(H) 2.2(H) -  Alkaline Phos 40 - 150 U/L 260(H) 163(H) -  AST 5 - 34 U/L 289(HH) 245(H) -  ALT 0 - 55 U/L 60(H) 42 -      RADIOGRAPHIC STUDIES: I have personally reviewed the radiological images as listed and agreed with the findings in the report. No results found.   CT C/A/P W Contrast 03/30/16 IMPRESSION: 1. Cirrhosis with portal hypertension and portal venous collaterals. 2. Numerous large confluent hepatic masses consistent with hepatomas. 3. 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 There is early cavernous transformation and a bizarre enhancement pattern of the liver because of it. 4. Periportal adenopathy. 5. No significant findings in the chest. No evidence of pulmonary metastatic disease. 6. Age advanced three-vessel coronary artery calcifications.  MR Abdomen W WO Contrast 03/14/16 IMPRESSION: Multiple large heterogeneous lesions scattered throughout both hepatic lobes. Markedly degraded arterial phase imaging hinders assessment and while some of these may represent hepatomas, areas of confluent fibrosis or dysplastic nodules could image similarly. Consider dedicated multiphase abdominal CT to better assess hyperenhancement in these lesions as hepatocellular carcinoma is a concern.  ASSESSMENT & PLAN:  Casey Reyes is a 61 y.o.  gentleman, with past medical history of hypertension, diabetes, untreated hepatitis C, presented with significantly elevated AFP, liver cirrhosis by CT and fibrotest (F4), and multiple liver masses.   1. Hepatocellular carcinoma, stage IIIA -Pt Is a 62 y.o. AAM with untreated hepatitis C, liver cirrhosis with portal hypertension. His Child-Pugh score is 7 (class B). -I reviewed the patient's recent CT scan with patient and his wife. The scan findings is consistent with hepatocellular carcinoma. Given his underlying liver cirrhosis, untreated  hepatitis C, significantly elevated AFP, his diagnosis of HCC is certain, no need for tissue biopsy to confirm. This was discussed in our GI tumor Board yesterday. - I discussed today that the patient is likely not a candidate for surgery at this time, due to the multifocal disease and the size of his tumors. The patient expresses understanding. -We discussed the option of liver Dorothea Ogle therapy, such as face or Y 90. Case was reviewed in GI tumor Board, interventional radiologist Dr. Anselm Pancoast feels Y90 maybe an option, but is concerned that he may not be a candidate if his liver functions gets worse, especially his TBIL.  -I discussed the option of systemic therapy, including sorafenib, Nivolumab and chemotherapy. Due to his is decompensated liver function (Child-Pugh B), he is not a great candidate for sorafenib, and he may not tolerate well. I recommended him to consider Nivolumab which has been approved by FDA last year.  We reviewed the potential side effects of immunotherapy.  This infusion would be given every 2 weeks. After 4-5 treatments I will order repeat CT scan. -We also discussed systemic chemotherapy. In general, [place] is not very sensitive to chemotherapy. We discussed side effects of chemo. -After extensive discussion, patient decided to proceed with Nivolumab. --Chemotherapy consent: Side effects including but does not not limited to, fatigue, nausea, diarrhea, skin rash, autoimmune disease such as thyroid dysfunction, pneumonitis, worsening liver functions, etc were discussed with patient in great detail. He agrees to proceed. -The goal of therapy is palliative -will start next week -I will refer the patient to Interventional Radiology for a consultation  -I encouraged the patient to attend chemotherapy class before first treatment   2. Hypercalcemia  -The patient was recently admitted to the hospital for hypercalcemia and received aggressive hydration with Zometa x 1 with good response. -His  discharge calcium was 11.2. -I encouraged the patient not to take calcium supplements, or excessively eat foods high in calcium. -I encouraged the patient to stay well hydrated.  3. Untreated Hepc  -She will continue follow-up with liver clinic NP Dawn  4. Liver cirrhosis with portal hypertension, child-Pugh B (7) -She will continue follow-up with liver clinic NP Dawn  5. HTN and DM -I encouraged him to follow-up with primary care physician  6. Goal of care discussion  -We again discussed the incurable nature of his cancer, and the overall poor prognosis, especially if he does not have good response to treatment or progress on treatment -The patient understands the goal of care is palliative. -I recommend DNR/DNI, he will think about it   Plan -I will refer the patient to IR and Social Work. -Repeat labs to check calcium levels and liver function. -The patient will begin treatment with Nivolumab on 04/11/16. -The patient will try to obtain FMLA paperwork, which I will be happy to sign.  No problem-specific Assessment & Plan notes found for this encounter.   Orders Placed This Encounter  Procedures  . Ambulatory referral to Social Work    Referral Priority:   Routine    Referral Type:   Consultation    Referral Reason:   Specialty Services Required    Number of Visits Requested:   1   All questions were answered. The patient knows to call the clinic with any problems, questions or concerns. No barriers to learning was detected.  I spent 30 minutes counseling the patient face to face. The total time spent in the appointment was 40 minutes and more than 50% was on counseling and review of test results  This document serves as a record of services personally performed by Truitt Merle, MD. It was created on her behalf by Maryla Morrow, a trained medical scribe. The creation of this record is based on the scribe's personal observations and the provider's statements to them. This  document has been checked and approved by the attending provider.     Truitt Merle, MD 04/04/15

## 2016-04-02 NOTE — Telephone Encounter (Signed)
FYI, Wife called stating that they have not heard from Dr. Christain [place] office.

## 2016-04-03 ENCOUNTER — Ambulatory Visit (HOSPITAL_BASED_OUTPATIENT_CLINIC_OR_DEPARTMENT_OTHER): Payer: 59

## 2016-04-03 ENCOUNTER — Telehealth: Payer: Self-pay | Admitting: Hematology

## 2016-04-03 ENCOUNTER — Ambulatory Visit (HOSPITAL_BASED_OUTPATIENT_CLINIC_OR_DEPARTMENT_OTHER): Payer: 59 | Admitting: Hematology

## 2016-04-03 VITALS — BP 122/75 | HR 88 | Temp 97.7°F | Resp 18 | Ht 67.0 in | Wt 234.8 lb

## 2016-04-03 DIAGNOSIS — C220 Liver cell carcinoma: Secondary | ICD-10-CM

## 2016-04-03 DIAGNOSIS — E1165 Type 2 diabetes mellitus with hyperglycemia: Secondary | ICD-10-CM

## 2016-04-03 DIAGNOSIS — I10 Essential (primary) hypertension: Secondary | ICD-10-CM

## 2016-04-03 DIAGNOSIS — K7469 Other cirrhosis of liver: Secondary | ICD-10-CM

## 2016-04-03 DIAGNOSIS — K7460 Unspecified cirrhosis of liver: Secondary | ICD-10-CM | POA: Insufficient documentation

## 2016-04-03 DIAGNOSIS — B1920 Unspecified viral hepatitis C without hepatic coma: Secondary | ICD-10-CM

## 2016-04-03 DIAGNOSIS — Z7189 Other specified counseling: Secondary | ICD-10-CM

## 2016-04-03 DIAGNOSIS — E1142 Type 2 diabetes mellitus with diabetic polyneuropathy: Secondary | ICD-10-CM

## 2016-04-03 DIAGNOSIS — IMO0002 Reserved for concepts with insufficient information to code with codable children: Secondary | ICD-10-CM

## 2016-04-03 LAB — CBC WITH DIFFERENTIAL/PLATELET
BASO%: 0.4 % (ref 0.0–2.0)
BASOS ABS: 0 10*3/uL (ref 0.0–0.1)
EOS%: 2.3 % (ref 0.0–7.0)
Eosinophils Absolute: 0.1 10*3/uL (ref 0.0–0.5)
HCT: 39.4 % (ref 38.4–49.9)
HGB: 13.5 g/dL (ref 13.0–17.1)
LYMPH%: 26.7 % (ref 14.0–49.0)
MCH: 28.7 pg (ref 27.2–33.4)
MCHC: 34.3 g/dL (ref 32.0–36.0)
MCV: 83.8 fL (ref 79.3–98.0)
MONO#: 0.8 10*3/uL (ref 0.1–0.9)
MONO%: 15.7 % — ABNORMAL HIGH (ref 0.0–14.0)
NEUT#: 2.7 10*3/uL (ref 1.5–6.5)
NEUT%: 54.9 % (ref 39.0–75.0)
Platelets: 229 10*3/uL (ref 140–400)
RBC: 4.7 10*6/uL (ref 4.20–5.82)
RDW: 16.8 % — AB (ref 11.0–14.6)
WBC: 4.8 10*3/uL (ref 4.0–10.3)
lymph#: 1.3 10*3/uL (ref 0.9–3.3)

## 2016-04-03 LAB — COMPREHENSIVE METABOLIC PANEL
ALT: 60 U/L — ABNORMAL HIGH (ref 0–55)
AST: 289 U/L — AB (ref 5–34)
Albumin: 3.5 g/dL (ref 3.5–5.0)
Alkaline Phosphatase: 260 U/L — ABNORMAL HIGH (ref 40–150)
Anion Gap: 10 mEq/L (ref 3–11)
BUN: 20.6 mg/dL (ref 7.0–26.0)
CHLORIDE: 106 meq/L (ref 98–109)
CO2: 25 mEq/L (ref 22–29)
Calcium: 11.4 mg/dL — ABNORMAL HIGH (ref 8.4–10.4)
Creatinine: 2.2 mg/dL — ABNORMAL HIGH (ref 0.7–1.3)
EGFR: 36 mL/min/{1.73_m2} — ABNORMAL LOW (ref >90)
GLUCOSE: 68 mg/dL — AB (ref 70–140)
POTASSIUM: 3.9 meq/L (ref 3.5–5.1)
SODIUM: 141 meq/L (ref 136–145)
Total Bilirubin: 2.71 mg/dL — ABNORMAL HIGH (ref 0.20–1.20)
Total Protein: 7.7 g/dL (ref 6.4–8.3)

## 2016-04-03 NOTE — Telephone Encounter (Signed)
Lab appointment added to today's schedule, per 04/03/16 los.

## 2016-04-03 NOTE — Telephone Encounter (Signed)
Chemo class scheduled for 04/04/16 per today's los. Lab, follow up and Chemo scheduled for Friday 04/11/16, per 04/03/16 los. No IR referral or Orders in Epic, Dr Burr Medico was advised via telephone and stated that she will update/add. Called S/W Lauren, got no answer. Left message on Lauren's voicemail with patient's info and appointment request, per 04/03/16 los. Patient was given a copy of the AVS report and appointment schedule per 04/03/16 los.

## 2016-04-04 ENCOUNTER — Other Ambulatory Visit: Payer: Self-pay | Admitting: Physician Assistant

## 2016-04-04 ENCOUNTER — Other Ambulatory Visit: Payer: 59

## 2016-04-04 DIAGNOSIS — N401 Enlarged prostate with lower urinary tract symptoms: Secondary | ICD-10-CM

## 2016-04-04 LAB — PTH-RELATED PEPTIDE: PTH-RELATED PEPTIDE: 2.7 pmol/L

## 2016-04-04 LAB — AFP TUMOR MARKER

## 2016-04-05 ENCOUNTER — Encounter: Payer: Self-pay | Admitting: Hematology

## 2016-04-05 DIAGNOSIS — Z7189 Other specified counseling: Secondary | ICD-10-CM | POA: Insufficient documentation

## 2016-04-05 NOTE — Progress Notes (Signed)
START OFF PATHWAY REGIMEN - [Other Dx]   [id number]:Nivolumab 240 mg q14 Days:   A cycle is every 14 days:     Nivolumab   **Always confirm dose/schedule in your pharmacy ordering system**    Intent of Therapy: Non-Curative / Palliative Intent, Discussed with Patient

## 2016-04-07 ENCOUNTER — Encounter: Payer: Self-pay | Admitting: Hematology

## 2016-04-07 NOTE — Progress Notes (Signed)
Casey Reyes  [phone] [phone]  Clinic Follow up Note   Patient Care Team: Brunetta Jeans, PA-C as PCP - General (Physician Assistant) 04/11/2016  SUMMARY OF ONCOLOGIC HISTORY: Oncology History   Cancer Staging Hepatocellular carcinoma [facility]) Staging form: Liver, AJCC 8th Edition - Clinical stage from 03/14/2016: Stage IIIA (cT3(m), cN0, cM0) - Signed by Truitt Merle, MD on 04/03/2016       Hepatocellular carcinoma ([place])   03/14/2016 Initial Diagnosis    Hepatocellular carcinoma ([place])      03/14/2016 Imaging    Abdominal MRI with and without contrast showed multiple large heterogeneous lesions scattered throughout both hepatic lobes, markedly degraded arterial phase image makes the diagnosis of [place] less certain. Area of confluent fibrosis or dysplastic nodules could image similarly.      03/30/2016 Imaging    CT chest, abdomen and pelvis with contrast showed liver cirrhosis with portal hypertension, numerous large confluent hepatic metastasis consistent with hepatomas. 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 No distant metastasis.       04/03/2016 Tumor Marker    AFP 160,321 ng/ml      History of present illness (03/28/2016): Casey Reyes is a 61 year old gentleman with past medical history of hypertension, type 2 diabetes, hepatitis C, untreated, was admitted yesterday for hypercalcemia. He was initially referred by his hematologist Heide Spark NP, and was scheduled to see me this morning in my clinic.   The patient, he has been doing well, fully functioning at home until about one week ago, he noticed dizziness, and imbalance. Family also noticed he has been more fatigued and is somnolent. He went to see his primary care physician yesterday, lab test showed hypercalcemia with calcium 11.0. He was sent to locally ED, and admitted to [facility] for hypercalcemia.  He is alert, oriented, when I saw him this morning. Denies any pain OR discomfort.  He had a history of IV drug abuse, was diagnosed with hepatitis C many years ago, but was never treated. He was referred to [facility] earlier this year, a liver fibrotest test estimated F4 fibrosis. lab tests reviewed hepatitis C genotype 1A, AFP significant elevated at 22,992, so further abdominal MRI was obtained on 10/12/2016. The MRI showed multiple T1 hyperintense, minimally T2 hyperintense of large lesions distributed through both hepatic lobes with right lobe predominance. Arterial face was interrogated by motion artifact, making the [facility] diagnosis challenging. Per patient, he also had a CT scan by his primary care physician, but I'm not able to see in the Epic.  ROS was otherwise negative. He had one episode of black stool 7-8 months ago, no hematochezia. He never had a colonoscopy.    CURRENT THERAPY: Nivolumab 269m every two weeks, starting 04/11/2016  INTERVAL HISTORY: The patient returns for follow up and first dose Nivolumab. He has not been feeling well. He has had a lot of gas, diarrhea, abdominal cramping, and bloating since Monday. He rates the discomfort as a 4-5 out of 10. Due to this, he hasn't been eating much for the past week, but still drinks a lot of water. Denies fever, or any other concerns.   REVIEW OF SYSTEMS:   Constitutional: Denies fevers, chills or abnormal weight loss (+) loss of appetite Eyes: Denies blurriness of vision Ears, nose, mouth, throat, and face: Denies mucositis or sore throat Respiratory: Denies cough, dyspnea or wheezes Cardiovascular: Denies palpitation, chest discomfort or lower  extremity swelling Gastrointestinal:  Denies nausea, heartburn or change in bowel habits (+) bloating, abdominal cramping, diarrhea  Skin: Denies abnormal skin rashes Lymphatics: Denies new lymphadenopathy or easy bruising Neurological:Denies numbness, tingling or new  weaknesses Behavioral/Psych: Mood is stable, no new changes  All other systems were reviewed with the patient and are negative.  MEDICAL HISTORY:  Past Medical History:  Diagnosis Date  . Chicken pox   . Diabetes mellitus without complication ([place])   . Hepatitis C   . Hepatocellular carcinoma ([place]) 03/28/2016  . Hypertension     SURGICAL HISTORY: Past Surgical History:  Procedure Laterality Date  . FINGER SURGERY     Right-Tendon Cut  . WISDOM TOOTH EXTRACTION      I have reviewed the social history and family history with the patient and they are unchanged from previous note.  ALLERGIES:  is allergic to gadolinium derivatives and eovist [gadoxetate].  MEDICATIONS:  Current Outpatient Prescriptions  Medication Sig Dispense Refill  . albuterol (PROVENTIL) (2.5 MG/3ML) 0.083% nebulizer solution Take 3 mLs (2.5 mg total) by nebulization every 2 (two) hours as needed for wheezing. 75 mL 12  . fluticasone (FLONASE) 50 MCG/ACT nasal spray Place 2 sprays into both nostrils daily. (Patient taking differently: Place 2 sprays into both nostrils daily as needed for allergies. ) 16 g 6  . ketoconazole (NIZORAL) 2 % cream Apply 1 application topically daily. (Patient taking differently: Apply 1 application topically daily as needed for irritation. ) 60 g 2  . loratadine (CLARITIN) 10 MG tablet Take 1 tablet (10 mg total) by mouth daily as needed for allergies. 30 tablet 5  . omeprazole (PRILOSEC OTC) 20 MG tablet Take 20 mg by mouth daily.    . pregabalin (LYRICA) 100 MG capsule Take 100 mg by mouth 2 (two) times daily.    Marland Kitchen Respiratory Therapy Supplies (NEBULIZER COMPRESSOR) KIT 1 kit by Does not apply route every 6 (six) hours as needed. 1 each 0  . tamsulosin (FLOMAX) 0.4 MG CAPS capsule TAKE TWO CAPSULES BY MOUTH ONCE DAILY 60 capsule 1  . traMADol (ULTRAM) 50 MG tablet Take 1 tablet (50 mg total) by mouth every 6 (six) hours as needed. 30 tablet 0   No current facility-administered  medications for this visit.     PHYSICAL EXAMINATION:  ECOG PERFORMANCE STATUS: 3  Vitals:   04/11/16 1100  BP: (!) 136/92  Pulse: 92  Resp: 17  Temp: 97.7 F (36.5 C)   Filed Weights   04/11/16 1100  Weight: 231 lb 3.2 oz (104.9 kg)   GENERAL:alert, no distress and comfortable SKIN: skin color, texture, turgor are normal, no rashes or significant lesions EYES: normal, Conjunctiva are pink and non-injected, sclera clear OROPHARYNX:no exudate, no erythema and lips, buccal mucosa, and tongue normal  NECK: supple, thyroid normal size, non-tender, without nodularity LYMPH:  no palpable lymphadenopathy in the cervical, axillary or inguinal LUNGS: clear to auscultation and percussion with normal breathing effort HEART: regular rate & rhythm and no murmurs and no lower extremity edema ABDOMEN:abdomen soft, distended. Mild diffuse tenderness in the right upper quadrant, normal bowel sounds  Musculoskeletal:no cyanosis of digits and no clubbing  NEURO: alert & oriented x 3 with fluent speech, no focal motor/sensory deficits  LABORATORY DATA:  I have reviewed the data as listed CBC Latest Ref Rng & Units 04/11/2016 04/03/2016 03/28/2016  WBC 4.0 - 10.3 10e3/uL 6.2 4.8 4.4  Hemoglobin 13.0 - 17.1 g/dL 13.3 13.5 13.4  Hematocrit 38.4 - 49.9 %  38.4 39.4 40.3  Platelets 140 - 400 10e3/uL 218 229 210   CMP Latest Ref Rng & Units 04/11/2016 04/09/2016 04/03/2016  Glucose 70 - 140 mg/dl 138 124(H) 68(L)  BUN 7.0 - 26.0 mg/dL 7.6 10 20.6  Creatinine 0.7 - 1.3 mg/dL 0.8 0.91 2.2(H)  Sodium 136 - 145 mEq/L 137 140 141  Potassium 3.5 - 5.1 mEq/L 3.6 4.0 3.9  Chloride 96 - 112 mEq/L - 105 -  CO2 22 - 29 mEq/L 21(L) 26 25  Calcium 8.4 - 10.4 mg/dL 10.4 10.2 11.4(H)  Total Protein 6.4 - 8.3 g/dL 7.4 - 7.7  Total Bilirubin 0.20 - 1.20 mg/dL 4.61(HH) - 2.71(H)  Alkaline Phos 40 - 150 U/L 303(H) - 260(H)  AST 5 - 34 U/L 345(HH) - 289(HH)  ALT 0 - 55 U/L 56(H) - 60(H)    RADIOGRAPHIC STUDIES: I  have personally reviewed the radiological images as listed and agreed with the findings in the report. No results found.   CT C/A/P W Contrast 03/30/16 IMPRESSION: 1. Cirrhosis with portal hypertension and portal venous collaterals. 2. Numerous large confluent hepatic masses consistent with hepatomas. 3. 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 There is early cavernous transformation and a bizarre enhancement pattern of the liver because of it. 4. Periportal adenopathy. 5. No significant findings in the chest. No evidence of pulmonary metastatic disease. 6. Age advanced three-vessel coronary artery calcifications.  MR Abdomen W WO Contrast 03/14/16 IMPRESSION: Multiple large heterogeneous lesions scattered throughout both hepatic lobes. Markedly degraded arterial phase imaging hinders assessment and while some of these may represent hepatomas, areas of confluent fibrosis or dysplastic nodules could image similarly. Consider dedicated multiphase abdominal CT to better assess hyperenhancement in these lesions as hepatocellular carcinoma is a concern.  ASSESSMENT & PLAN:  Casey Reyes is a 61 y.o.  gentleman, with past medical history of hypertension, diabetes, untreated hepatitis C, presented with significantly elevated AFP, liver cirrhosis by CT and fibrotest (F4), and multiple liver masses.  1. Hepatocellular carcinoma, stage IIIA Pt Is a 61 y.o. AAM with untreated hepatitis C, liver cirrhosis with portal hypertension. His Child-Pugh score is 7 (class B). -I previously reviewed the patient's recent CT scan with patient and his wife. The scan findings is consistent with hepatocellular carcinoma. Given his underlying liver cirrhosis, untreated hepatitis C, significantly elevated AFP, his diagnosis of HCC is certain, no need for tissue biopsy to confirm. This was discussed in our GI tumor Board yesterday. - I discussed today  that the patient is likely not a candidate for surgery at this time, due to the multifocal disease and the size of his tumors. The patient expresses understanding. -I previously discussed various treatment options, and I recommended him to start Nivolumab, he agreed -Lab reviewed, worsening LFTs likely secondary to [facility], will proceed with first dose Nivo today and continue every 2 weeks -The goal of therapy is palliative.  -I previously referred the patient to Interventional Radiology for a consultation, unfortunately he is not a candidate for liver targeted therapy due to his worsening liver function -His HCC has progressed rapidly. We discussed that Nivolumab therapy may take some time to see the effect, unfortunately I do not have other good treatment options. Due to her worsening function and poor performance status, I did not think chemotherapy would be a better option for him.   2. Hypercalcemia  -The patient was recently admitted to the hospital for hypercalcemia  and received aggressive hydration with Zometa x 1 with good response. -resolved now  -I encouraged the patient not to take calcium supplements, or excessively eat foods high in calcium. -I encouraged the patient to stay well hydrated.  3. Untreated Hepc  -She will continue follow-up with liver clinic NP Dawn  4. Liver cirrhosis with portal hypertension, child-Pugh B (7) -She will continue follow-up with liver clinic NP Dawn  5. HTN and DM -I encouraged him to follow-up with primary care physician  6. Goal of care discussion  -We again discussed the incurable nature of his cancer, and the overall poor prognosis, especially if he does not have good response to treatment or progress on treatment -The patient understands the goal of care is palliative. -I recommend DNR/DNI, he will think about it   7. Abdominal Discomfort -bloating, diarrhea, abdominal cramping, and gas for the past week. -Rates the discomfort as a 4-5 out of  10.  -Preventing him from eating much, but he is still drinking water -I recommended Boost or Ensure.  -Prescribed Tramadol.  -Ordered US to be done next week to rule out biliary obstruction (not seen on his recent CT will MRI), and ruled out ascites        Plan. --start Nivolumab today and continue every 2 weeks  -Prescribe Tramadol. 1 tablet every 6-8 hours as needed for his abdominal pain   -schedule [place] for next week.  -Return for f/u in one week with my NP. Treatment in 2 weeks.    All questions were answered. The patient knows to call the clinic with any problems, questions or concerns. No barriers to learning was detected.  I spent 20 minutes counseling the patient face to face. The total time spent in the appointment was 30 minutes and more than 50% was on counseling and review of test results  This document serves as a record of services personally performed by Truitt Merle, MD. It was created on her behalf by Martinique Casey, a trained medical scribe. The creation of this record is based on the scribe's personal observations and the provider's statements to them. This document has been checked and approved by the attending provider.  I have reviewed the above documentation for accuracy and completeness and I agree with the above.   Truitt Merle, MD 04/11/2016

## 2016-04-07 NOTE — Progress Notes (Signed)
Spoke w/ pt to discuss copay assistance w/ [employer] for Opdivo.  Pt would like to apply so I completed the application, got Dr. Ernestina Penna signature and will get pt to sign his portion on 04/11/16.  Once completed I will fax to [employer] for processing.  Pt is overqualified for the [employer].

## 2016-04-08 ENCOUNTER — Ambulatory Visit (INDEPENDENT_AMBULATORY_CARE_PROVIDER_SITE_OTHER): Payer: 59 | Admitting: Physician Assistant

## 2016-04-08 ENCOUNTER — Encounter: Payer: Self-pay | Admitting: Physician Assistant

## 2016-04-08 VITALS — BP 110/82 | HR 90 | Temp 98.5°F | Resp 14 | Ht 67.0 in | Wt 233.0 lb

## 2016-04-08 DIAGNOSIS — IMO0002 Reserved for concepts with insufficient information to code with codable children: Secondary | ICD-10-CM

## 2016-04-08 DIAGNOSIS — N179 Acute kidney failure, unspecified: Secondary | ICD-10-CM

## 2016-04-08 DIAGNOSIS — B1920 Unspecified viral hepatitis C without hepatic coma: Secondary | ICD-10-CM | POA: Diagnosis not present

## 2016-04-08 DIAGNOSIS — C220 Liver cell carcinoma: Secondary | ICD-10-CM

## 2016-04-08 DIAGNOSIS — I10 Essential (primary) hypertension: Secondary | ICD-10-CM | POA: Diagnosis not present

## 2016-04-08 DIAGNOSIS — E1142 Type 2 diabetes mellitus with diabetic polyneuropathy: Secondary | ICD-10-CM | POA: Diagnosis not present

## 2016-04-08 DIAGNOSIS — E1165 Type 2 diabetes mellitus with hyperglycemia: Secondary | ICD-10-CM | POA: Diagnosis not present

## 2016-04-08 NOTE — Progress Notes (Signed)
Patient presents to clinic today for TCM visit. Patient was sent to ER on 03/27/16 after critical calcium level noted on routine labs at 13.  ER assessment included repeat assessment of labs revealing increased Ca at 13.38mglucose 219, alk phos at 188, albumin at 3.2, AST 202. Patient given 2L of normal saline IV fluids in the ER. [facility]. Patient given zolendronic acid x 1. Calcium trended down to 11.2 at discharge. PTH level appropriately low due level of calcium. PTH-rp normal. Vitamin D level pending at discharge. Had Oncology consult in [facility]. [facility]. Patient discharged on home medications with FU with Oncology in 3 days.   Patient was evaluated by Dr. FBurr Medico(oncology) on 04/03/16. Patient was sent to the lab for repeat blood work to assess calcium and liver function. Creatinine elevated at 2.2 with eGFR in lower 30s. No change to medications were made per patient.   Since discharge, patient endorses feeling much better overall. He is eating well and staying hydrated. Has been taking Jardiance 1/2 tablet ever other day. Is taking losartan daily as directed. Endorses last dose yesterday morning. Patient denies chest pain, palpitations, dizziness, vision changes or frequent headaches. Notes occasional lightheadedness with standing. Has been working hard on diet to help with weight loss. Is checking fasting sugars. Averaging 110-140 mostly. One day of fasting glucose at 95.   Past Medical History:  Diagnosis Date  . Chicken pox   . Diabetes mellitus without complication ([place]   . Hepatitis C   . Hepatocellular carcinoma ([place] 03/28/2016  . Hypertension     Current Outpatient Prescriptions on File Prior to Visit  Medication Sig Dispense Refill  . albuterol (PROVENTIL) (2.5 MG/3ML) 0.083% nebulizer solution Take 3 mLs (2.5 mg total) by nebulization every 2 (two) hours as needed for wheezing. 75  mL 12  . fluticasone (FLONASE) 50 MCG/ACT nasal spray Place 2 sprays into both nostrils daily. (Patient taking differently: Place 2 sprays into both nostrils daily as needed for allergies. ) 16 g 6  . ketoconazole (NIZORAL) 2 % cream Apply 1 application topically daily. (Patient taking differently: Apply 1 application topically daily as needed for irritation. ) 60 g 2  . loratadine (CLARITIN) 10 MG tablet Take 1 tablet (10 mg total) by mouth daily as needed for allergies. 30 tablet 5  . omeprazole (PRILOSEC OTC) 20 MG tablet Take 20 mg by mouth daily.    . pregabalin (LYRICA) 100 MG capsule Take 100 mg by mouth 2 (two) times daily.    .Marland KitchenRespiratory Therapy Supplies (NEBULIZER COMPRESSOR) KIT 1 kit by Does not apply route every 6 (six) hours as needed. 1 each 0  . tamsulosin (FLOMAX) 0.4 MG CAPS capsule TAKE TWO CAPSULES BY MOUTH ONCE DAILY 60 capsule 1   No current facility-administered medications on file prior to visit.     Allergies  Allergen Reactions  . Gadolinium Derivatives Hives and Itching  . Eovist [Gadoxetate] Hives and Itching    Hives and itching over face and chest after 10 ml Eovist given. 50 mg Benadryl given and he was observed by nurse and Dr. GJeralyn Ruthsuntil symptoms improved and he was released. ry    Family History  Problem Relation Age of Onset  . Heart attack Father     Deceased  . Heart disease Father   . Diabetes Mother     Living  . Heart attack Brother [id number]  Deceased  . Sudden death Brother   . Thyroid disease Sister   . Diabetes Sister   . Healthy Sister     x1  . Colon cancer Brother     x1  . Heart disease Maternal Grandfather   . Allergies Daughter   . Diabetes Son   . Diabetes Maternal Grandmother     Social History   Social History  . Marital status: Divorced    Spouse name: N/A  . Number of children: N/A  . Years of education: N/A   Social History Main Topics  . Smoking status: Current Some Day Smoker    Packs/day: 0.25    Types:  Cigarettes  . Smokeless tobacco: Never Used  . Alcohol use Yes  . Drug use: No  . Sexual activity: Not Asked   Other Topics Concern  . None   Social History Narrative  . None   Review of Systems - See HPI.  All other ROS are negative.  BP 110/82   Pulse 90   Temp 98.5 F (36.9 C) (Oral)   Resp 14   Ht _0  (1.702 m)   Wt 233 lb (105.7 kg)   SpO2 99%   BMI 36.49 kg/m   Physical Exam  Constitutional: He is oriented to person, place, and time. No distress.  HENT:  Head: Normocephalic and atraumatic.  Right Ear: External ear normal. Tympanic membrane is not erythematous. A middle ear effusion (serous) is present.  Left Ear: External ear normal. Tympanic membrane is not erythematous.  No middle ear effusion.  Mouth/Throat: Oropharynx is clear and moist. No oropharyngeal exudate.  Eyes: Conjunctivae are normal. Pupils are equal, round, and reactive to light.  Neck: Neck supple.  Cardiovascular: Normal rate, regular rhythm and normal heart sounds.  Exam reveals no gallop and no friction rub.   No murmur heard. Pulmonary/Chest: Effort normal and breath sounds normal. No respiratory distress. He has no wheezes. He has no rales.  Lymphadenopathy:    He has no cervical adenopathy.  Neurological: He is alert and oriented to person, place, and time.  Skin: Skin is warm and dry. He is not diaphoretic.  Psychiatric: Affect normal.   Recent Results (from the past 2160 hour(s))  Comp Met (CMET)     Status: Abnormal   Collection Time: 02/08/16 12:31 PM  Result Value Ref Range   Sodium 137 135 - 145 mEq/L   Potassium 3.9 3.5 - 5.1 mEq/L   Chloride 101 96 - 112 mEq/L   CO2 27 19 - 32 mEq/L   Glucose, Bld 276 (H) 70 - 99 mg/dL   BUN 11 6 - 23 mg/dL   Creatinine, Ser 0.80 0.40 - 1.50 mg/dL   Total Bilirubin 0.6 0.2 - 1.2 mg/dL   Alkaline Phosphatase 123 (H) 39 - 117 U/L   AST 112 (H) 0 - 37 U/L   ALT 70 (H) 0 - 53 U/L   Total Protein 6.9 6.0 - 8.3 g/dL   Albumin 3.9 3.5 - 5.2  g/dL   Calcium 9.7 8.4 - 10.5 mg/dL   GFR 126.41 >60.00 mL/min  CBC     Status: Abnormal   Collection Time: 02/08/16 12:31 PM  Result Value Ref Range   WBC 2.4 Repeated and verified X2. (L) 4.0 - 10.5 K/uL   RBC 5.26 4.22 - 5.81 Mil/uL   Platelets 107.0 (L) 150.0 - 400.0 K/uL   Hemoglobin 15.2 13.0 - 17.0 g/dL   HCT 45.5 39.0 - 52.0 %  MCV 86.6 78.0 - 100.0 fl   MCHC 33.3 30.0 - 36.0 g/dL   RDW 14.1 11.5 - 15.5 %  Hemoglobin A1c     Status: Abnormal   Collection Time: 02/08/16 12:31 PM  Result Value Ref Range   Hgb A1c MFr Bld 10.6 (H) 4.6 - 6.5 %    Comment: Glycemic Control Guidelines for People with Diabetes:Non Diabetic:  <6%Goal of Therapy: <7%Additional Action Suggested:  >8%   Urinalysis, Routine w reflex microscopic     Status: Abnormal   Collection Time: 02/08/16 12:31 PM  Result Value Ref Range   Color, Urine YELLOW Yellow;Lt. Yellow   APPearance CLEAR Clear   Specific Gravity, Urine 1.020 1.000 - 1.030   pH 6.0 5.0 - 8.0   Total Protein, Urine NEGATIVE Negative   Urine Glucose >=1000 (A) Negative   Ketones, ur NEGATIVE Negative   Bilirubin Urine NEGATIVE Negative   Hgb urine dipstick NEGATIVE Negative   Urobilinogen, UA 0.2 0.0 - 1.0   Leukocytes, UA NEGATIVE Negative   Nitrite NEGATIVE Negative   WBC, UA none seen 0-2/hpf   RBC / HPF none seen 0-2/hpf   Squamous Epithelial / LPF Rare(0-4/hpf) Rare(0-4/hpf)  PSA     Status: None   Collection Time: 02/08/16 12:31 PM  Result Value Ref Range   PSA 1.58 0.10 - 4.00 ng/mL  Lipid panel     Status: Abnormal   Collection Time: 02/08/16 12:31 PM  Result Value Ref Range   Cholesterol 184 0 - 200 mg/dL    Comment: ATP III Classification       Desirable:  < 200 mg/dL               Borderline High:  200 - 239 mg/dL          High:  > = 240 mg/dL   Triglycerides 238.0 (H) 0.0 - 149.0 mg/dL    Comment: Normal:  <150 mg/dLBorderline High:  150 - 199 mg/dL   HDL 21.80 (L) >39.00 mg/dL   VLDL 47.6 (H) 0.0 - 40.0 mg/dL    Total CHOL/HDL Ratio 8     Comment:                Men          Women1/2 Average Risk     3.4          3.3Average Risk          5.0          4.42X Average Risk          9.6          7.13X Average Risk          15.0          11.0                       NonHDL 161.77     Comment: NOTE:  Non-HDL goal should be 30 mg/dL higher than patient's LDL goal (i.e. LDL goal of < 70 mg/dL, would have non-HDL goal of < 100 mg/dL)  Hepatitis C RNA quantitative     Status: Abnormal   Collection Time: 02/08/16 12:31 PM  Result Value Ref Range   HCV Quantitative 5,033,001 (H) <15 IU/mL   HCV Quantitative Log 6.70 (H) <1.18 log 10  LDL cholesterol, direct     Status: None   Collection Time: 02/08/16 12:31 PM  Result Value Ref Range   Direct LDL 123.0 mg/dL  Comment: Optimal:  <100 mg/dLNear or Above Optimal:  100-129 mg/dLBorderline High:  130-159 mg/dLHigh:  160-189 mg/dLVery High:  >190 mg/dL  POCT Glucose (CBG)     Status: Abnormal   Collection Time: 02/08/16  1:12 PM  Result Value Ref Range   POC Glucose 304 (A) 70 - 99 mg/dl  Comp Met (CMET)     Status: Abnormal   Collection Time: 03/27/16 12:38 PM  Result Value Ref Range   Sodium 132 (L) 135 - 145 mEq/L   Potassium 4.3 3.5 - 5.1 mEq/L   Chloride 101 96 - 112 mEq/L   CO2 28 19 - 32 mEq/L   Glucose, Bld 230 (H) 70 - 99 mg/dL   BUN 12 6 - 23 mg/dL   Creatinine, Ser 0.98 0.40 - 1.50 mg/dL   Total Bilirubin 2.6 (H) 0.2 - 1.2 mg/dL   Alkaline Phosphatase 216 (H) 39 - 117 U/L   AST 202 (H) 0 - 37 U/L   ALT 47 0 - 53 U/L   Total Protein 7.2 6.0 - 8.3 g/dL   Albumin 3.8 3.5 - 5.2 g/dL   Calcium 13.3 (HH) 8.4 - 10.5 mg/dL   GFR 99.98 >60.00 mL/min  CBG monitoring, ED     Status: Abnormal   Collection Time: 03/27/16  6:55 PM  Result Value Ref Range   Glucose-Capillary 222 (H) 65 - 99 mg/dL  Basic metabolic panel     Status: Abnormal   Collection Time: 03/27/16  7:00 PM  Result Value Ref Range   Sodium 136 135 - 145 mmol/L   Potassium 4.3 3.5 -  5.1 mmol/L   Chloride 102 101 - 111 mmol/L   CO2 28 22 - 32 mmol/L   Glucose, Bld 219 (H) 65 - 99 mg/dL   BUN 12 6 - 20 mg/dL   Creatinine, Ser 1.04 0.61 - 1.24 mg/dL   Calcium 13.6 (HH) 8.9 - 10.3 mg/dL    Comment: REPEATED TO VERIFY CRITICAL RESULT CALLED TO, READ BACK BY AND VERIFIED WITH: DIANE MERRITT RN ON 08/01/62 AT 3329 BY ALICE GALLIMORE MLT    GFR calc non Af Amer >60 >60 mL/min   GFR calc Af Amer >60 >60 mL/min    Comment: (NOTE) The eGFR has been calculated using the CKD EPI equation. This calculation has not been validated in all clinical situations. eGFR's persistently <60 mL/min signify possible Chronic Kidney Disease.    Anion gap 6 5 - 15  CBC     Status: Abnormal   Collection Time: 03/27/16  7:00 PM  Result Value Ref Range   WBC 5.6 4.0 - 10.5 K/uL    Comment: WHITE COUNT CONFIRMED ON SMEAR   RBC 5.05 4.22 - 5.81 MIL/uL   Hemoglobin 14.5 13.0 - 17.0 g/dL   HCT 41.7 39.0 - 52.0 %   MCV 82.6 78.0 - 100.0 fL   MCH 28.7 26.0 - 34.0 pg   MCHC 34.8 30.0 - 36.0 g/dL   RDW 16.3 (H) 11.5 - 15.5 %   Platelets 242 150 - 400 K/uL    Comment: PLATELET COUNT CONFIRMED BY SMEAR SPECIMEN CHECKED FOR CLOTS   Protime-INR     Status: None   Collection Time: 03/27/16  8:20 PM  Result Value Ref Range   Prothrombin Time 13.1 11.4 - 15.2 seconds   INR 0.99   Hepatic function panel     Status: Abnormal   Collection Time: 03/27/16  8:20 PM  Result Value Ref Range   Total Protein  7.3 6.5 - 8.1 g/dL   Albumin 3.2 (L) 3.5 - 5.0 g/dL   AST 202 (H) 15 - 41 U/L   ALT 47 17 - 63 U/L   Alkaline Phosphatase 188 (H) 38 - 126 U/L   Total Bilirubin 2.4 (H) 0.3 - 1.2 mg/dL   Bilirubin, Direct 1.2 (H) 0.1 - 0.5 mg/dL   Indirect Bilirubin 1.2 (H) 0.3 - 0.9 mg/dL  I-Stat CG4 Lactic Acid, ED     Status: None   Collection Time: 03/27/16  8:28 PM  Result Value Ref Range   Lactic Acid, Venous 1.40 0.5 - 1.9 mmol/L  Urinalysis, Routine w reflex microscopic     Status: Abnormal    Collection Time: 03/27/16 10:00 PM  Result Value Ref Range   Color, Urine AMBER (A) YELLOW    Comment: BIOCHEMICALS MAY BE AFFECTED BY COLOR   APPearance CLEAR CLEAR   Specific Gravity, Urine 1.016 1.005 - 1.030   pH 5.5 5.0 - 8.0   Glucose, UA NEGATIVE NEGATIVE mg/dL   Hgb urine dipstick NEGATIVE NEGATIVE   Bilirubin Urine SMALL (A) NEGATIVE   Ketones, ur NEGATIVE NEGATIVE mg/dL   Protein, ur NEGATIVE NEGATIVE mg/dL   Nitrite NEGATIVE NEGATIVE   Leukocytes, UA NEGATIVE NEGATIVE    Comment: Microscopic not done on urines with negative protein, blood, leukocytes, nitrite, or glucose < 500 mg/dL.  HIV antibody (Routine Testing)     Status: None   Collection Time: 03/28/16  7:15 AM  Result Value Ref Range   HIV Screen 4th Generation wRfx Non Reactive Non Reactive    Comment: (NOTE) Performed At: [facility] [address] [place], [place] [id number] Lindon Romp MD VW:[id number]   Comprehensive metabolic panel     Status: Abnormal   Collection Time: 03/28/16  7:15 AM  Result Value Ref Range   Sodium 137 135 - 145 mmol/L   Potassium 4.4 3.5 - 5.1 mmol/L   Chloride 107 101 - 111 mmol/L   CO2 24 22 - 32 mmol/L   Glucose, Bld 155 (H) 65 - 99 mg/dL   BUN 8 6 - 20 mg/dL   Creatinine, Ser 0.84 0.61 - 1.24 mg/dL   Calcium 13.0 (H) 8.9 - 10.3 mg/dL   Total Protein 6.4 (L) 6.5 - 8.1 g/dL   Albumin 2.9 (L) 3.5 - 5.0 g/dL   AST 190 (H) 15 - 41 U/L   ALT 44 17 - 63 U/L   Alkaline Phosphatase 194 (H) 38 - 126 U/L   Total Bilirubin 2.7 (H) 0.3 - 1.2 mg/dL   GFR calc non Af Amer >60 >60 mL/min   GFR calc Af Amer >60 >60 mL/min    Comment: (NOTE) The eGFR has been calculated using the CKD EPI equation. This calculation has not been validated in all clinical situations. eGFR's persistently <60 mL/min signify possible Chronic Kidney Disease.    Anion gap 6 5 - 15  CBC     Status: Abnormal   Collection Time: 03/28/16  7:15 AM  Result Value Ref Range   WBC 4.4 4.0 - 10.5  K/uL   RBC 4.73 4.22 - 5.81 MIL/uL   Hemoglobin 13.4 13.0 - 17.0 g/dL   HCT 40.3 39.0 - 52.0 %   MCV 85.2 78.0 - 100.0 fL   MCH 28.3 26.0 - 34.0 pg   MCHC 33.3 30.0 - 36.0 g/dL   RDW 16.1 (H) 11.5 - 15.5 %   Platelets 210 150 - 400 K/uL  Glucose, capillary  Status: Abnormal   Collection Time: 03/28/16  8:13 AM  Result Value Ref Range   Glucose-Capillary 135 (H) 65 - 99 mg/dL  Glucose, capillary     Status: Abnormal   Collection Time: 03/28/16 11:49 AM  Result Value Ref Range   Glucose-Capillary 166 (H) 65 - 99 mg/dL  Glucose, capillary     Status: Abnormal   Collection Time: 03/28/16  5:16 PM  Result Value Ref Range   Glucose-Capillary 135 (H) 65 - 99 mg/dL  Glucose, capillary     Status: Abnormal   Collection Time: 03/28/16  9:59 PM  Result Value Ref Range   Glucose-Capillary 129 (H) 65 - 99 mg/dL  Parathyroid hormone, intact (no Ca)     Status: Abnormal   Collection Time: 03/29/16  4:27 AM  Result Value Ref Range   PTH 4 (L) 15 - 65 pg/mL    Comment: (NOTE) Performed At: [facility] [place], [place] [id number] Lindon Romp MD QQ:[id number]   PTH-related peptide     Status: None   Collection Time: 03/29/16  4:27 AM  Result Value Ref Range   PTH-related peptide 2.7 pmol/L    Comment: (NOTE) Reference Range: All Ages: <2.0 The PTHrP assay should not be used to exclude cancer or screen tumor patients for humoral hypercalcemia of malignancy (HHM). The results should always be assessed in conjunction with the patient's medical history, clinical examination, and other findings. If test results are clinically discordant, please contact the laboratory. Performed At: [facility] [place], [place] [id number] [place] MD [id number]   VITAMIN D 25 Hydroxy (Vit-D Deficiency, Fractures)     Status: Abnormal   Collection Time: 03/29/16  4:27 AM  Result Value Ref Range   Vit D, 25-Hydroxy 14.9 (L)  30.0 - 100.0 ng/mL    Comment: (NOTE) Vitamin D deficiency has been defined by the Institute of Medicine and an Endocrine Society practice guideline as a level of serum 25-OH vitamin D less than 20 ng/mL (1,2). The Endocrine Society went on to further define vitamin D insufficiency as a level between 21 and 29 ng/mL (2). 1. IOM (Institute of Medicine). 2010. Dietary reference   intakes for calcium and D. [place]: The   [employer]. 2. Holick MF, Binkley Saxton, Bischoff-Ferrari HA, et al.   Evaluation, treatment, and prevention of vitamin D   deficiency: an Endocrine Society clinical practice   guideline. JCEM. 2011 Jul; 96(7):1911-30. Performed At: [facility] [place], [place] [id number] Lindon Romp MD [id number]   Basic metabolic panel     Status: Abnormal   Collection Time: 03/29/16  4:27 AM  Result Value Ref Range   Sodium 135 135 - 145 mmol/L   Potassium 3.8 3.5 - 5.1 mmol/L   Chloride 102 101 - 111 mmol/L   CO2 25 22 - 32 mmol/L   Glucose, Bld 113 (H) 65 - 99 mg/dL   BUN 7 6 - 20 mg/dL   Creatinine, Ser 0.82 0.61 - 1.24 mg/dL   Calcium 13.3 (HH) 8.9 - 10.3 mg/dL    Comment: CRITICAL RESULT CALLED TO, READ BACK BY AND VERIFIED WITH: LOFTIN Q,RN 03/29/16 0557 WAYK    GFR calc non Af Amer >60 >60 mL/min   GFR calc Af Amer >60 >60 mL/min    Comment: (NOTE) The eGFR has been calculated using the CKD EPI equation. This calculation has not been validated in all clinical situations. eGFR's  persistently <60 mL/min signify possible Chronic Kidney Disease.    Anion gap 8 5 - 15  Glucose, capillary     Status: Abnormal   Collection Time: 03/29/16  8:10 AM  Result Value Ref Range   Glucose-Capillary 100 (H) 65 - 99 mg/dL  Glucose, capillary     Status: Abnormal   Collection Time: 03/29/16 12:25 PM  Result Value Ref Range   Glucose-Capillary 167 (H) 65 - 99 mg/dL  Glucose, capillary     Status: Abnormal   Collection Time:  03/29/16  5:20 PM  Result Value Ref Range   Glucose-Capillary 245 (H) 65 - 99 mg/dL  Glucose, capillary     Status: Abnormal   Collection Time: 03/29/16  9:12 PM  Result Value Ref Range   Glucose-Capillary 226 (H) 65 - 99 mg/dL  Basic metabolic panel     Status: Abnormal   Collection Time: 03/30/16  5:20 AM  Result Value Ref Range   Sodium 134 (L) 135 - 145 mmol/L   Potassium 4.2 3.5 - 5.1 mmol/L   Chloride 104 101 - 111 mmol/L   CO2 23 22 - 32 mmol/L   Glucose, Bld 190 (H) 65 - 99 mg/dL   BUN 9 6 - 20 mg/dL   Creatinine, Ser 0.91 0.61 - 1.24 mg/dL   Calcium 12.7 (H) 8.9 - 10.3 mg/dL   GFR calc non Af Amer >60 >60 mL/min   GFR calc Af Amer >60 >60 mL/min    Comment: (NOTE) The eGFR has been calculated using the CKD EPI equation. This calculation has not been validated in all clinical situations. eGFR's persistently <60 mL/min signify possible Chronic Kidney Disease.    Anion gap 7 5 - 15  Glucose, capillary     Status: Abnormal   Collection Time: 03/30/16  7:47 AM  Result Value Ref Range   Glucose-Capillary 149 (H) 65 - 99 mg/dL  Glucose, capillary     Status: Abnormal   Collection Time: 03/30/16 12:00 PM  Result Value Ref Range   Glucose-Capillary 185 (H) 65 - 99 mg/dL  Glucose, capillary     Status: Abnormal   Collection Time: 03/30/16  5:00 PM  Result Value Ref Range   Glucose-Capillary 171 (H) 65 - 99 mg/dL  Glucose, capillary     Status: None   Collection Time: 03/31/16  2:52 AM  Result Value Ref Range   Glucose-Capillary 85 65 - 99 mg/dL  Basic metabolic panel     Status: Abnormal   Collection Time: 03/31/16  4:46 AM  Result Value Ref Range   Sodium 136 135 - 145 mmol/L   Potassium 3.6 3.5 - 5.1 mmol/L   Chloride 107 101 - 111 mmol/L   CO2 26 22 - 32 mmol/L   Glucose, Bld 92 65 - 99 mg/dL   BUN 12 6 - 20 mg/dL   Creatinine, Ser 0.94 0.61 - 1.24 mg/dL   Calcium 11.2 (H) 8.9 - 10.3 mg/dL   GFR calc non Af Amer >60 >60 mL/min   GFR calc Af Amer >60 >60 mL/min     Comment: (NOTE) The eGFR has been calculated using the CKD EPI equation. This calculation has not been validated in all clinical situations. eGFR's persistently <60 mL/min signify possible Chronic Kidney Disease.    Anion gap 3 (L) 5 - 15  Hepatic function panel     Status: Abnormal   Collection Time: 03/31/16  7:39 AM  Result Value Ref Range   Total Protein 6.2 (L)  6.5 - 8.1 g/dL   Albumin 3.0 (L) 3.5 - 5.0 g/dL   AST 245 (H) 15 - 41 U/L   ALT 42 17 - 63 U/L   Alkaline Phosphatase 163 (H) 38 - 126 U/L   Total Bilirubin 2.2 (H) 0.3 - 1.2 mg/dL   Bilirubin, Direct 1.2 (H) 0.1 - 0.5 mg/dL   Indirect Bilirubin 1.0 (H) 0.3 - 0.9 mg/dL  Glucose, capillary     Status: None   Collection Time: 03/31/16  7:49 AM  Result Value Ref Range   Glucose-Capillary 97 65 - 99 mg/dL  CBC with Differential     Status: Abnormal   Collection Time: 04/03/16  1:40 PM  Result Value Ref Range   WBC 4.8 4.0 - 10.3 10e3/uL   NEUT# 2.7 1.5 - 6.5 10e3/uL   HGB 13.5 13.0 - 17.1 g/dL   HCT 39.4 38.4 - 49.9 %   Platelets 229 140 - 400 10e3/uL   MCV 83.8 79.3 - 98.0 fL   MCH 28.7 27.2 - 33.4 pg   MCHC 34.3 32.0 - 36.0 g/dL   RBC 4.70 4.20 - 5.82 10e6/uL   RDW 16.8 (H) 11.0 - 14.6 %   lymph# 1.3 0.9 - 3.3 10e3/uL   MONO# 0.8 0.1 - 0.9 10e3/uL   Eosinophils Absolute 0.1 0.0 - 0.5 10e3/uL   Basophils Absolute 0.0 0.0 - 0.1 10e3/uL   NEUT% 54.9 39.0 - 75.0 %   LYMPH% 26.7 14.0 - 49.0 %   MONO% 15.7 (H) 0.0 - 14.0 %   EOS% 2.3 0.0 - 7.0 %   BASO% 0.4 0.0 - 2.0 %  Comprehensive metabolic panel     Status: Abnormal   Collection Time: 04/03/16  1:40 PM  Result Value Ref Range   Sodium 141 136 - 145 mEq/L   Potassium 3.9 3.5 - 5.1 mEq/L   Chloride 106 98 - 109 mEq/L   CO2 25 22 - 29 mEq/L   Glucose 68 (L) 70 - 140 mg/dl    Comment: Glucose reference range is for nonfasting patients. Fasting glucose reference range is 70- 100.   BUN 20.6 7.0 - 26.0 mg/dL   Creatinine 2.2 (H) 0.7 - 1.3 mg/dL   Total  Bilirubin 2.71 (H) 0.20 - 1.20 mg/dL   Alkaline Phosphatase 260 (H) 40 - 150 U/L   AST 289 (HH) 5 - 34 U/L   ALT 60 (H) 0 - 55 U/L   Total Protein 7.7 6.4 - 8.3 g/dL   Albumin 3.5 3.5 - 5.0 g/dL   Calcium 11.4 (H) 8.4 - 10.4 mg/dL   Anion Gap 10 3 - 11 mEq/L   EGFR 36 (L) >90 ml/min/1.73 m2    Comment: eGFR is calculated using the CKD-EPI Creatinine Equation (2009)  AFP tumor marker     Status: Abnormal   Collection Time: 04/03/16  1:40 PM  Result Value Ref Range   AFP, Serum, Tumor Marker Diluted result (A) 0.0 - 8.3 ng/mL    Comment: Results confirmed on dilution. 268341 [employer] ECLIA methodology     Assessment/Plan: 1. Hepatitis C virus infection without hepatic coma, unspecified chronicity Followed by Hepatology clinic here in [place]. No treatment at present. Hepatology originally wanted Oncology consult and plan in place before determining further steps with the Hep C. Patient unsure if he has follow-up scheduled. Will reach out to specialist office to check on this.   2. Hypercalcemia Now managed by Oncology. Stable elevation at 11.2-11.4 on recent checks.    3.  Hepatocellular carcinoma ([place]) Followed by Dr. Burr Medico. Will be starting infusions in the next week. Patient has FU scheduled this Friday with Dr. Burr Medico.   4. Essential hypertension Previously well-controlled on losartan. Patient with normotensive BP today despite not taking his losartan. Labs 04/03/16 with Oncology revealed creatinine at 2.2 (baseline is 0.9) with GFR in the 30s (baseline >90). Will hold ARB. STAT BMP today.   5. Uncontrolled type 2 diabetes mellitus with diabetic polyneuropathy, without long-term current use of insulin (HCC) Sugars doing well on Jardiance 5 mg. Has only been taking QOD. Fasting sugars averaging 100-120. Again creatinine elevated on check with Oncology. No changes made at that time. Will stop Jardiance and get STAT BMP.   6. AKI (acute kidney injury) ([place]) Unclear etiology. New since  discharge from hospital. Noted on labs 04/03/16 by Oncology. Cannot see where this was followed up on yet by Oncology. Patient is on ARB and Jardiance. Has tolerated before without any renal insult. Will hold these medications at present. STAT BMP today.   Leeanne Rio, PA-C

## 2016-04-08 NOTE — Progress Notes (Signed)
Pre visit review using our clinic review tool, if applicable. No additional management support is needed unless otherwise documented below in the visit note. 

## 2016-04-08 NOTE — Patient Instructions (Addendum)
Stop Losartan and Jardiance for now.  We will need to repeat your kidney function before restarting due to abnormal labs found at visit with Dr. Burr Medico.  Follow-up with Dr. Burr Medico Friday as scheduled. I will make a note to review your labs.  I will reach out to the liver specialist regarding next steps for Hep C so we can get some clarity.   Continue checking fasting sugars every day and write down. Bring to follow-up.  Follow-up will be based on repeat lab results.

## 2016-04-09 ENCOUNTER — Other Ambulatory Visit: Payer: Self-pay | Admitting: Physician Assistant

## 2016-04-09 ENCOUNTER — Other Ambulatory Visit (INDEPENDENT_AMBULATORY_CARE_PROVIDER_SITE_OTHER): Payer: 59

## 2016-04-09 DIAGNOSIS — N179 Acute kidney failure, unspecified: Secondary | ICD-10-CM

## 2016-04-09 LAB — BASIC METABOLIC PANEL
BUN: 10 mg/dL (ref 6–23)
CALCIUM: 10.2 mg/dL (ref 8.4–10.5)
CO2: 26 mEq/L (ref 19–32)
Chloride: 105 mEq/L (ref 96–112)
Creatinine, Ser: 0.91 mg/dL (ref 0.40–1.50)
GFR: 108.89 mL/min (ref >60.00)
Glucose, Bld: 124 mg/dL — ABNORMAL HIGH (ref 70–99)
Potassium: 4 mEq/L (ref 3.5–5.1)
SODIUM: 140 meq/L (ref 135–145)

## 2016-04-11 ENCOUNTER — Ambulatory Visit (HOSPITAL_BASED_OUTPATIENT_CLINIC_OR_DEPARTMENT_OTHER): Payer: 59 | Admitting: Hematology

## 2016-04-11 ENCOUNTER — Ambulatory Visit (HOSPITAL_BASED_OUTPATIENT_CLINIC_OR_DEPARTMENT_OTHER): Payer: 59

## 2016-04-11 ENCOUNTER — Encounter: Payer: Self-pay | Admitting: Hematology

## 2016-04-11 ENCOUNTER — Other Ambulatory Visit: Payer: Self-pay | Admitting: Pharmacist

## 2016-04-11 ENCOUNTER — Telehealth: Payer: Self-pay | Admitting: Hematology

## 2016-04-11 ENCOUNTER — Other Ambulatory Visit (HOSPITAL_BASED_OUTPATIENT_CLINIC_OR_DEPARTMENT_OTHER): Payer: 59

## 2016-04-11 VITALS — BP 136/92 | HR 92 | Temp 97.7°F | Resp 17 | Ht 67.0 in | Wt 231.2 lb

## 2016-04-11 DIAGNOSIS — C220 Liver cell carcinoma: Secondary | ICD-10-CM

## 2016-04-11 DIAGNOSIS — E1165 Type 2 diabetes mellitus with hyperglycemia: Secondary | ICD-10-CM

## 2016-04-11 DIAGNOSIS — I10 Essential (primary) hypertension: Secondary | ICD-10-CM

## 2016-04-11 DIAGNOSIS — B1920 Unspecified viral hepatitis C without hepatic coma: Secondary | ICD-10-CM

## 2016-04-11 DIAGNOSIS — E1142 Type 2 diabetes mellitus with diabetic polyneuropathy: Secondary | ICD-10-CM

## 2016-04-11 DIAGNOSIS — Z5112 Encounter for antineoplastic immunotherapy: Secondary | ICD-10-CM

## 2016-04-11 DIAGNOSIS — R109 Unspecified abdominal pain: Secondary | ICD-10-CM | POA: Diagnosis not present

## 2016-04-11 DIAGNOSIS — K7469 Other cirrhosis of liver: Secondary | ICD-10-CM

## 2016-04-11 DIAGNOSIS — IMO0002 Reserved for concepts with insufficient information to code with codable children: Secondary | ICD-10-CM

## 2016-04-11 LAB — CBC WITH DIFFERENTIAL/PLATELET
BASO%: 0.6 % (ref 0.0–2.0)
BASOS ABS: 0 10*3/uL (ref 0.0–0.1)
EOS ABS: 0.1 10*3/uL (ref 0.0–0.5)
EOS%: 1.6 % (ref 0.0–7.0)
HCT: 38.4 % (ref 38.4–49.9)
HGB: 13.3 g/dL (ref 13.0–17.1)
LYMPH%: 21 % (ref 14.0–49.0)
MCH: 28.4 pg (ref 27.2–33.4)
MCHC: 34.6 g/dL (ref 32.0–36.0)
MCV: 82.1 fL (ref 79.3–98.0)
MONO#: 0.7 10*3/uL (ref 0.1–0.9)
MONO%: 10.5 % (ref 0.0–14.0)
NEUT#: 4.1 10*3/uL (ref 1.5–6.5)
NEUT%: 66.3 % (ref 39.0–75.0)
Platelets: 218 10*3/uL (ref 140–400)
RBC: 4.68 10*6/uL (ref 4.20–5.82)
RDW: 17.2 % — ABNORMAL HIGH (ref 11.0–14.6)
WBC: 6.2 10*3/uL (ref 4.0–10.3)
lymph#: 1.3 10*3/uL (ref 0.9–3.3)

## 2016-04-11 LAB — COMPREHENSIVE METABOLIC PANEL
ALBUMIN: 3 g/dL — AB (ref 3.5–5.0)
ALK PHOS: 303 U/L — AB (ref 40–150)
ALT: 56 U/L — ABNORMAL HIGH (ref 0–55)
ANION GAP: 10 meq/L (ref 3–11)
AST: 345 U/L — AB (ref 5–34)
BUN: 7.6 mg/dL (ref 7.0–26.0)
CALCIUM: 10.4 mg/dL (ref 8.4–10.4)
CO2: 21 mEq/L — ABNORMAL LOW (ref 22–29)
Chloride: 106 mEq/L (ref 98–109)
Creatinine: 0.8 mg/dL (ref 0.7–1.3)
Glucose: 138 mg/dl (ref 70–140)
POTASSIUM: 3.6 meq/L (ref 3.5–5.1)
Sodium: 137 mEq/L (ref 136–145)
Total Bilirubin: 4.61 mg/dL (ref 0.20–1.20)
Total Protein: 7.4 g/dL (ref 6.4–8.3)

## 2016-04-11 MED ORDER — NIVOLUMAB CHEMO INJECTION 100 MG/10ML
240.0000 mg | Freq: Once | INTRAVENOUS | Status: AC
  Administered 2016-04-11: 240 mg via INTRAVENOUS
  Filled 2016-04-11: qty 24

## 2016-04-11 MED ORDER — TRAMADOL HCL 50 MG PO TABS: 50.0000 mg | ORAL_TABLET | Freq: Four times a day (QID) | ORAL | 0 refills | Status: DC | PRN

## 2016-04-11 MED ORDER — SODIUM CHLORIDE 0.9 % IV SOLN
Freq: Once | INTRAVENOUS | Status: AC
  Administered 2016-04-11: 12:00:00 via INTRAVENOUS

## 2016-04-11 NOTE — Patient Instructions (Addendum)
[facility] Discharge Instructions for Patients Receiving Chemotherapy  Today you received the following chemotherapy agents: Nivolumab   To help prevent nausea and vomiting after your treatment, we encourage you to take your nausea medication as directed.    If you develop nausea and vomiting that is not controlled by your nausea medication, call the clinic.   BELOW ARE SYMPTOMS THAT SHOULD BE REPORTED IMMEDIATELY:  *FEVER GREATER THAN 100.5 F  *CHILLS WITH OR WITHOUT FEVER  NAUSEA AND VOMITING THAT IS NOT CONTROLLED WITH YOUR NAUSEA MEDICATION  *UNUSUAL SHORTNESS OF BREATH  *UNUSUAL BRUISING OR BLEEDING  TENDERNESS IN MOUTH AND THROAT WITH OR WITHOUT PRESENCE OF ULCERS  *URINARY PROBLEMS  *BOWEL PROBLEMS  UNUSUAL RASH Items with * indicate a potential emergency and should be followed up as soon as possible.  Feel free to call the clinic you have any questions or concerns. The clinic phone number is [phone].  Please show the CHEMO ALERT CARD at check-in to the Emergency Department and triage nurse.  Nivolumab injection What is this medicine? NIVOLUMAB (nye VOL ue mab) is a monoclonal antibody. It is used to treat melanoma, lung cancer, kidney cancer, head and neck cancer, Hodgkin lymphoma, urothelial cancer, colon cancer, and liver cancer. This medicine may be used for other purposes; ask your health care provider or pharmacist if you have questions. COMMON BRAND NAME(S): Opdivo What should I tell my health care provider before I take this medicine? They need to know if you have any of these conditions: -diabetes -immune system problems -kidney disease -liver disease -lung disease -organ transplant -stomach or intestine problems -thyroid disease -an unusual or allergic reaction to nivolumab, other medicines, foods, dyes, or preservatives -pregnant or trying to get pregnant -breast-feeding How should I use this medicine? This medicine is for  infusion into a vein. It is given by a health care professional in a hospital or clinic setting. A special MedGuide will be given to you before each treatment. Be sure to read this information carefully each time. Talk to your pediatrician regarding the use of this medicine in children. While this drug may be prescribed for children as young as 12 years for selected conditions, precautions do apply. Overdosage: If you think you have taken too much of this medicine contact a poison control center or emergency room at once. NOTE: This medicine is only for you. Do not share this medicine with others. What if I miss a dose? It is important not to miss your dose. Call your doctor or health care professional if you are unable to keep an appointment. What may interact with this medicine? Interactions have not been studied. Give your health care provider a list of all the medicines, herbs, non-prescription drugs, or dietary supplements you use. Also tell them if you smoke, drink alcohol, or use illegal drugs. Some items may interact with your medicine. This list may not describe all possible interactions. Give your health care provider a list of all the medicines, herbs, non-prescription drugs, or dietary supplements you use. Also tell them if you smoke, drink alcohol, or use illegal drugs. Some items may interact with your medicine. What should I watch for while using this medicine? This drug may make you feel generally unwell. Continue your course of treatment even though you feel ill unless your doctor tells you to stop. You may need blood work done while you are taking this medicine. Do not become pregnant while taking this medicine or for 5 months after   stopping it. Women should inform their doctor if they wish to become pregnant or think they might be pregnant. There is a potential for serious side effects to an unborn child. Talk to your health care professional or pharmacist for more information. Do  not breast-feed an infant while taking this medicine. What side effects may I notice from receiving this medicine? Side effects that you should report to your doctor or health care professional as soon as possible: -allergic reactions like skin rash, itching or hives, swelling of the face, lips, or tongue -black, tarry stools -blood in the urine -bloody or watery diarrhea -changes in vision -change in sex drive -changes in emotions or moods -chest pain -confusion -cough -decreased appetite -diarrhea -facial flushing -feeling faint or lightheaded -fever, chills -hair loss -hallucination, loss of contact with reality -headache -irritable -joint pain -loss of memory -muscle pain -muscle weakness -seizures -shortness of breath -signs and symptoms of high blood sugar such as dizziness; dry mouth; dry skin; fruity breath; nausea; stomach pain; increased hunger or thirst; increased urination -signs and symptoms of kidney injury like trouble passing urine or change in the amount of urine -signs and symptoms of liver injury like dark yellow or brown urine; general ill feeling or flu-like symptoms; light-colored stools; loss of appetite; nausea; right upper belly pain; unusually weak or tired; yellowing of the eyes or skin -stiff neck -swelling of the ankles, feet, hands -weight gain Side effects that usually do not require medical attention (report to your doctor or health care professional if they continue or are bothersome): -bone pain -constipation -tiredness -vomiting This list may not describe all possible side effects. Call your doctor for medical advice about side effects. You may report side effects to FDA at 1-800-FDA-1088. Where should I keep my medicine? This drug is given in a hospital or clinic and will not be stored at home. NOTE: This sheet is a summary. It may not cover all possible information. If you have questions about this medicine, talk to your doctor,  pharmacist, or health care provider.  2018 Elsevier/Gold Standard (2015-10-22 17:49:34)   

## 2016-04-11 NOTE — Progress Notes (Signed)
Pt saw Dr. Burr Medico today prior to chemo.  OK to treat with elevated Bilirubin and AST levels as per md.

## 2016-04-11 NOTE — Telephone Encounter (Signed)
Appointments scheduled per 3.16.18 LOS. Patient given AVS report and calendars with future scheduled appointments.  °

## 2016-04-14 ENCOUNTER — Encounter: Payer: Self-pay | Admitting: *Deleted

## 2016-04-14 ENCOUNTER — Telehealth: Payer: Self-pay | Admitting: Physician Assistant

## 2016-04-14 ENCOUNTER — Encounter: Payer: Self-pay | Admitting: Hematology

## 2016-04-14 NOTE — Progress Notes (Signed)
Pt is approved w/ [employer] for Opdivo from 04/11/16 to 01/26/17.  Emailed copy of approval letter to Belinda Fisher and Elmyra Ricks in billing and to HIM to scan in pt's chart.

## 2016-04-14 NOTE — Telephone Encounter (Signed)
Please call patient to assess how his fasting sugars and BP are running. We stopped the jardiance and losartan due to elevation in his creatinine. Restarted losartan first and his repeat BMP with Oncology looked stable. Want to know how sugars are before choosing to restart Jardiance or starting a new medication.

## 2016-04-14 NOTE — Progress Notes (Signed)
[place] Work  Clinical Social Work was referred by Futures trader for assessment of psychosocial needs.  Clinical Social Worker contacted patient at home to offer support and assess for needs.  CSW explained role and services available at [facility].  Patient did not express any needs or concerns at this time.  CSW offered additional support.  CSW confirmed patients appointment for the 21st.  A Member of CSW team will attempt to meet with patient at upcoming appointment.        Johnnye Lana, MSW, LCSW, OSW-C Clinical Social Worker [facility] [phone]

## 2016-04-14 NOTE — Telephone Encounter (Signed)
LM requesting return call to discuss fasting blood sugars and BP readings.

## 2016-04-15 ENCOUNTER — Telehealth: Payer: Self-pay

## 2016-04-15 NOTE — Telephone Encounter (Signed)
lvm f/u call for chemo last friday

## 2016-04-15 NOTE — Telephone Encounter (Signed)
LM requesting return call regarding BS and BP readings.

## 2016-04-15 NOTE — Telephone Encounter (Signed)
-----   Message from Ronnette Juniper, RN sent at 04/11/2016  2:08 PM EDT ----- Regarding: 1st Nivolumab/Feng 1st time [facility]

## 2016-04-16 ENCOUNTER — Other Ambulatory Visit: Payer: 59

## 2016-04-16 ENCOUNTER — Ambulatory Visit: Payer: 59 | Admitting: Nurse Practitioner

## 2016-04-16 NOTE — Telephone Encounter (Signed)
He has follow-up with Oncology today. Would make sure he keeps that so they can assess giving his [facility] and make sure they repeat a CMP at visit. I will review as well so we can determine restarting medications especially since he is not checking BP or glucose as directed.

## 2016-04-16 NOTE — Telephone Encounter (Signed)
Spoke with Peter Congo, advised to have patient monitor fasting glucoses and keep appointment for today with Oncology. Peter Congo verbalized understanding.

## 2016-04-16 NOTE — Telephone Encounter (Signed)
Spoke with Peter Congo (pts partner) who states the patient has not monitored his blood sugars or blood pressure since last office visit. She reports the patient has been experiencing weakness and decreased appetite x 2 days, would like to know if there is anything they can do. Call back # [phone]. Patient has appt this afternoon with ONC.

## 2016-04-17 ENCOUNTER — Telehealth: Payer: Self-pay | Admitting: *Deleted

## 2016-04-17 DIAGNOSIS — E860 Dehydration: Secondary | ICD-10-CM

## 2016-04-17 NOTE — Telephone Encounter (Signed)
Rein missed his appointment yesterday.  Does he need to reschedule or do we just need to come in next week as scheduled?  I also need to know what can he eat.  Has diarrhea and I got that under control yesterday."  Encouraged to FF, try yogurt and other soft foods.  B.R.A.T. Diet information provided.  Emphasized avoiding apple juice, sodas, greasy, fried and spicy foods.

## 2016-04-18 ENCOUNTER — Ambulatory Visit (HOSPITAL_BASED_OUTPATIENT_CLINIC_OR_DEPARTMENT_OTHER): Payer: 59

## 2016-04-18 ENCOUNTER — Ambulatory Visit (HOSPITAL_BASED_OUTPATIENT_CLINIC_OR_DEPARTMENT_OTHER): Payer: 59 | Admitting: Nurse Practitioner

## 2016-04-18 ENCOUNTER — Other Ambulatory Visit (HOSPITAL_BASED_OUTPATIENT_CLINIC_OR_DEPARTMENT_OTHER): Payer: 59

## 2016-04-18 VITALS — BP 103/65 | HR 99 | Resp 17 | Wt 225.3 lb

## 2016-04-18 DIAGNOSIS — G893 Neoplasm related pain (acute) (chronic): Secondary | ICD-10-CM

## 2016-04-18 DIAGNOSIS — C220 Liver cell carcinoma: Secondary | ICD-10-CM

## 2016-04-18 DIAGNOSIS — Z79899 Other long term (current) drug therapy: Secondary | ICD-10-CM | POA: Diagnosis not present

## 2016-04-18 LAB — COMPREHENSIVE METABOLIC PANEL
ALT: 50 U/L (ref 0–55)
AST: 656 U/L (ref 5–34)
Albumin: 2.8 g/dL — ABNORMAL LOW (ref 3.5–5.0)
Alkaline Phosphatase: 273 U/L — ABNORMAL HIGH (ref 40–150)
Anion Gap: 9 mEq/L (ref 3–11)
BUN: 17.1 mg/dL (ref 7.0–26.0)
CALCIUM: 14.1 mg/dL — AB (ref 8.4–10.4)
CHLORIDE: 103 meq/L (ref 98–109)
CO2: 21 meq/L — AB (ref 22–29)
CREATININE: 1.2 mg/dL (ref 0.7–1.3)
EGFR: 72 mL/min/{1.73_m2} — ABNORMAL LOW (ref >90)
Glucose: 181 mg/dl — ABNORMAL HIGH (ref 70–140)
POTASSIUM: 4.5 meq/L (ref 3.5–5.1)
Sodium: 133 mEq/L — ABNORMAL LOW (ref 136–145)
Total Bilirubin: 6.48 mg/dL (ref 0.20–1.20)
Total Protein: 7.3 g/dL (ref 6.4–8.3)

## 2016-04-18 LAB — CBC WITH DIFFERENTIAL/PLATELET
BASO%: 0.9 % (ref 0.0–2.0)
BASOS ABS: 0.1 10*3/uL (ref 0.0–0.1)
EOS%: 1.8 % (ref 0.0–7.0)
Eosinophils Absolute: 0.1 10*3/uL (ref 0.0–0.5)
HEMATOCRIT: 38.4 % (ref 38.4–49.9)
HGB: 13.3 g/dL (ref 13.0–17.1)
LYMPH%: 21.8 % (ref 14.0–49.0)
MCH: 28.5 pg (ref 27.2–33.4)
MCHC: 34.6 g/dL (ref 32.0–36.0)
MCV: 82.4 fL (ref 79.3–98.0)
MONO#: 0.7 10*3/uL (ref 0.1–0.9)
MONO%: 10.9 % (ref 0.0–14.0)
NEUT#: 4.2 10*3/uL (ref 1.5–6.5)
NEUT%: 64.6 % (ref 39.0–75.0)
Platelets: 258 10*3/uL (ref 140–400)
RBC: 4.66 10*6/uL (ref 4.20–5.82)
RDW: 19.2 % — ABNORMAL HIGH (ref 11.0–14.6)
WBC: 6.5 10*3/uL (ref 4.0–10.3)
lymph#: 1.4 10*3/uL (ref 0.9–3.3)

## 2016-04-18 LAB — TSH: TSH: 0.764 m[IU]/L (ref 0.320–4.118)

## 2016-04-18 MED ORDER — SODIUM CHLORIDE 0.9 % IV SOLN
1000.0000 mL | Freq: Once | INTRAVENOUS | Status: AC
  Administered 2016-04-18: 1000 mL via INTRAVENOUS

## 2016-04-18 MED ORDER — ZOLEDRONIC ACID 4 MG/100ML IV SOLN
4.0000 mg | Freq: Once | INTRAVENOUS | Status: AC
  Administered 2016-04-18: 4 mg via INTRAVENOUS
  Filled 2016-04-18: qty 100

## 2016-04-18 NOTE — Progress Notes (Addendum)
[facility] OFFICE PROGRESS NOTE   SUMMARY OF ONCOLOGIC HISTORY:     Oncology History   Cancer Staging Hepatocellular carcinoma [facility]) Staging form: Liver, AJCC 8th Edition - Clinical stage from 03/14/2016: Stage IIIA (cT3(m), cN0, cM0) - Signed by Truitt Merle, MD on 04/03/2016       Hepatocellular carcinoma ([place])   03/14/2016 Initial Diagnosis    Hepatocellular carcinoma ([place])      03/14/2016 Imaging    Abdominal MRI with and without contrast showed multiple large heterogeneous lesions scattered throughout both hepatic lobes, markedly degraded arterial phase image makes the diagnosis of HCC less certain. Area of confluent fibrosis or dysplastic nodules could image similarly.      03/30/2016 Imaging    CT chest, abdomen and pelvis with contrast showed liver cirrhosis with portal hypertension, numerous large confluent hepatic metastasis consistent with hepatomas. 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 No distant metastasis.       04/03/2016 Tumor Marker    AFP 160,321 ng/ml         INTERVAL HISTORY:   [facility] Reyes returns after missing a follow-up visit earlier this week. He completed cycle 1 nivolumab 04/11/2016. Energy level is poor. He is weak. He feels "bloated". He had 2 or 3 episodes of nausea/vomiting for 5 days ago. He was having loose stools earlier in the week. [facility] Reyes now describes the stools as "in-between". Last loose bowel movement was yesterday. He took some Imodium. He has low abdominal pain. Appetite has improved over the past 2-3 days. [facility] Reyes has noted some confusion.   Objective:  Vital signs in last 24 hours:  Blood pressure 103/65, pulse 99, resp. rate 17, weight 225 lb 4.8 oz (102.2 kg), SpO2 100 %.    HEENT: Scleral icterus. No thrush. Resp: Lungs clear bilaterally. Cardio: Regular rate and rhythm. GI: Abdomen distended consistent with  ascites. Liver palpable mid abdomen. Vascular: No leg edema. Neuro: Alert. Follows commands.     Lab Results:  Lab Results  Component Value Date   WBC 6.5 04/18/2016   HGB 13.3 04/18/2016   HCT 38.4 04/18/2016   MCV 82.4 04/18/2016   PLT 258 04/18/2016   NEUTROABS 4.2 04/18/2016    Imaging:  No results found.  Medications: I have reviewed the patient's current medications.  Assessment/Plan: 1. Hepatocellular carcinoma, stage IIIA; cycle 1 nivolumab 04/11/2016 2. Hypercalcemia status post Zometa 03/28/2016 3. Untreated hepatitis C 4. 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 Hypertension and diabetes 6. Abdominal discomfort secondary to #1 7. Goals of care discussion. Dr. Burr Medico has previously discussed the incurable nature of [facility] cancer and the overall poor prognosis especially if he does not have a good response to treatment or progresses while on treatment. He understands the goal of care is palliative. DNR/DNI recommended.   Disposition: [facility] Reyes has completed 1 cycle of nivolumab. He presents today with failure to thrive. He has recurrent marked hypercalcemia. He will receive Zometa and IV fluids in the office today.  We again discussed that the cancer he has is incurable. He and [facility] Reyes understand that if the cancer appears to be progressing while on treatment, the nivolumab will be discontinued and we would recommend a supportive/comfort care approach with a hospice referral. [facility] Reyes asked about the possibility of a different type of treatment. I reviewed with them that he is not a candidate for chemotherapy due to  worsening liver function and poor performance status.  [facility] Reyes will return as scheduled on 04/24/2016 for reevaluation and further discussion with Dr. Burr Medico. He will contact the office in the interim with any problems.  Today's labs and the above plan reviewed with Dr. Burr Medico. 25 minutes were spent face-to-face at today's visit with the  majority of that time involved in counseling/coordination of care.   Ned Card ANP/GNP-BC   04/18/2016  1:47 PM

## 2016-04-18 NOTE — Telephone Encounter (Signed)
Telephone call to wife. She had an injection today and is not able to drive. Patient is not doing any better. Nausea and diarrhea. He has not had much intake since Monday. He is fatigued and weak. Wife states she is going to try to get her family to bring her and pt up to the cancer center today but she states they have not been supportive. Not enough funds for a cab or uber. She will call a few friends and will return call to notify us they are on their way.   Advised Selena Lesser, NP and Ned Card, NP situation. Lab orders entered.  Will follow up with pt wife at 77 to see if we can schedule a time for lab and the appt.

## 2016-04-18 NOTE — Patient Instructions (Addendum)
Zoledronic Acid injection (Hypercalcemia, Oncology) What is this medicine? ZOLEDRONIC ACID (ZOE le dron ik AS id) lowers the amount of calcium loss from bone. It is used to treat too much calcium in your blood from cancer. It is also used to prevent complications of cancer that has spread to the bone. This medicine may be used for other purposes; ask your health care provider or pharmacist if you have questions. COMMON BRAND NAME(S): Zometa What should I tell my health care provider before I take this medicine? They need to know if you have any of these conditions: -aspirin-sensitive asthma -cancer, especially if you are receiving medicines used to treat cancer -dental disease or wear dentures -infection -kidney disease -receiving corticosteroids like dexamethasone or prednisone -an unusual or allergic reaction to zoledronic acid, other medicines, foods, dyes, or preservatives -pregnant or trying to get pregnant -breast-feeding How should I use this medicine? This medicine is for infusion into a vein. It is given by a health care professional in a hospital or clinic setting. Talk to your pediatrician regarding the use of this medicine in children. Special care may be needed. Overdosage: If you think you have taken too much of this medicine contact a poison control center or emergency room at once. NOTE: This medicine is only for you. Do not share this medicine with others. What if I miss a dose? It is important not to miss your dose. Call your doctor or health care professional if you are unable to keep an appointment. What may interact with this medicine? -certain antibiotics given by injection -NSAIDs, medicines for pain and inflammation, like ibuprofen or naproxen -some diuretics like bumetanide, furosemide -teriparatide -thalidomide This list may not describe all possible interactions. Give your health care provider a list of all the medicines, herbs, non-prescription drugs, or  dietary supplements you use. Also tell them if you smoke, drink alcohol, or use illegal drugs. Some items may interact with your medicine. What should I watch for while using this medicine? Visit your doctor or health care professional for regular checkups. It may be some time before you see the benefit from this medicine. Do not stop taking your medicine unless your doctor tells you to. Your doctor may order blood tests or other tests to see how you are doing. Women should inform their doctor if they wish to become pregnant or think they might be pregnant. There is a potential for serious side effects to an unborn child. Talk to your health care professional or pharmacist for more information. You should make sure that you get enough calcium and vitamin D while you are taking this medicine. Discuss the foods you eat and the vitamins you take with your health care professional. Some people who take this medicine have severe bone, joint, and/or muscle pain. This medicine may also increase your risk for jaw problems or a broken thigh bone. Tell your doctor right away if you have severe pain in your jaw, bones, joints, or muscles. Tell your doctor if you have any pain that does not go away or that gets worse. Tell your dentist and dental surgeon that you are taking this medicine. You should not have major dental surgery while on this medicine. See your dentist to have a dental exam and fix any dental problems before starting this medicine. Take good care of your teeth while on this medicine. Make sure you see your dentist for regular follow-up appointments. What side effects may I notice from receiving this medicine? Side effects that   you should report to your doctor or health care professional as soon as possible: -allergic reactions like skin rash, itching or hives, swelling of the face, lips, or tongue -anxiety, confusion, or depression -breathing problems -changes in vision -eye pain -feeling faint or  lightheaded, falls -jaw pain, especially after dental work -mouth sores -muscle cramps, stiffness, or weakness -redness, blistering, peeling or loosening of the skin, including inside the mouth -trouble passing urine or change in the amount of urine Side effects that usually do not require medical attention (report to your doctor or health care professional if they continue or are bothersome): -bone, joint, or muscle pain -constipation -diarrhea -fever -hair loss -irritation at site where injected -loss of appetite -nausea, vomiting -stomach upset -trouble sleeping -trouble swallowing -weak or tired This list may not describe all possible side effects. Call your doctor for medical advice about side effects. You may report side effects to FDA at 1-800-FDA-1088. Where should I keep my medicine? This drug is given in a hospital or clinic and will not be stored at home. NOTE: This sheet is a summary. It may not cover all possible information. If you have questions about this medicine, talk to your doctor, pharmacist, or health care provider.  2018 Elsevier/Gold Standard (2013-06-11 14:19:39)   Dehydration, Adult Dehydration is a condition in which there is not enough fluid or water in the body. This happens when you lose more fluids than you take in. Important organs, such as the kidneys, brain, and heart, cannot function without a proper amount of fluids. Any loss of fluids from the body can lead to dehydration. Dehydration can range from mild to severe. This condition should be treated right away to prevent it from becoming severe. What are the causes? This condition may be caused by:  Vomiting.  Diarrhea.  Excessive sweating, such as from heat exposure or exercise.  Not drinking enough fluid, especially:  When ill.  While doing activity that requires a lot of energy.  Excessive urination.  Fever.  Infection.  Certain medicines, such as medicines that cause the body  to lose excess fluid (diuretics).  Inability to access safe drinking water.  Reduced physical ability to get adequate water and food. What increases the risk? This condition is more likely to develop in people:  Who have a poorly controlled long-term (chronic) illness, such as diabetes, heart disease, or kidney disease.  Who are age 61 or older.  Who are disabled.  Who live in a place with high altitude.  Who play endurance sports. What are the signs or symptoms? Symptoms of mild dehydration may include:   Thirst.  Dry lips.  Slightly dry mouth.  Dry, warm skin.  Dizziness. Symptoms of moderate dehydration may include:   Very dry mouth.  Muscle cramps.  Dark urine. Urine may be the color of tea.  Decreased urine production.  Decreased tear production.  Heartbeat that is irregular or faster than normal (palpitations).  Headache.  Light-headedness, especially when you stand up from a sitting position.  Fainting (syncope). Symptoms of severe dehydration may include:   Changes in skin, such as:  Cold and clammy skin.  Blotchy (mottled) or pale skin.  Skin that does not quickly return to normal after being lightly pinched and released (poor skin turgor).  Changes in body fluids, such as:  Extreme thirst.  No tear production.  Inability to sweat when body temperature is high, such as in hot weather.  Very little urine production.  Changes in vital signs,  such as:  Weak pulse.  Pulse that is more than 100 beats a minute when sitting still.  Rapid breathing.  Low blood pressure.  Other changes, such as:  Sunken eyes.  Cold hands and feet.  Confusion.  Lack of energy (lethargy).  Difficulty waking up from sleep.  Short-term weight loss.  Unconsciousness. How is this diagnosed? This condition is diagnosed based on your symptoms and a physical exam. Blood and urine tests may be done to help confirm the diagnosis. How is this  treated? Treatment for this condition depends on the severity. Mild or moderate dehydration can often be treated at home. Treatment should be started right away. Do not wait until dehydration becomes severe. Severe dehydration is an emergency and it needs to be treated in a hospital. Treatment for mild dehydration may include:   Drinking more fluids.  Replacing salts and minerals in your blood (electrolytes) that you may have lost. Treatment for moderate dehydration may include:   Drinking an oral rehydration solution (ORS). This is a drink that helps you replace fluids and electrolytes (rehydrate). It can be found at pharmacies and retail stores. Treatment for severe dehydration may include:   Receiving fluids through an IV tube.  Receiving an electrolyte solution through a feeding tube that is passed through your nose and into your stomach (nasogastric tube, or NG tube).  Correcting any abnormalities in electrolytes.  Treating the underlying cause of dehydration. Follow these instructions at home:  If directed by your health care provider, drink an ORS:  Make an ORS by following instructions on the package.  Start by drinking small amounts, about  cup (120 mL) every 5-10 minutes.  Slowly increase how much you drink until you have taken the amount recommended by your health care provider.  Drink enough clear fluid to keep your urine clear or pale yellow. If you were told to drink an ORS, finish the ORS first, then start slowly drinking other clear fluids. Drink fluids such as:  Water. Do not drink only water. Doing that can lead to having too little salt (sodium) in the body (hyponatremia).  Ice chips.  Fruit juice that you have added water to (diluted fruit juice).  Low-calorie sports drinks.  Avoid:  Alcohol.  Drinks that contain a lot of sugar. These include high-calorie sports drinks, fruit juice that is not diluted, and soda.  Caffeine.  Foods that are greasy or  contain a lot of fat or sugar.  Take over-the-counter and prescription medicines only as told by your health care provider.  Do not take sodium tablets. This can lead to having too much sodium in the body (hypernatremia).  Eat foods that contain a healthy balance of electrolytes, such as bananas, oranges, potatoes, tomatoes, and spinach.  Keep all follow-up visits as told by your health care provider. This is important. Contact a health care provider if:  You have abdominal pain that:  Gets worse.  Stays in one area (localizes).  You have a rash.  You have a stiff neck.  You are more irritable than usual.  You are sleepier or more difficult to wake up than usual.  You feel weak or dizzy.  You feel very thirsty.  You have urinated only a small amount of very dark urine over 6-8 hours. Get help right away if:  You have symptoms of severe dehydration.  You cannot drink fluids without vomiting.  Your symptoms get worse with treatment.  You have a fever.  You have a severe  headache.  You have vomiting or diarrhea that:  Gets worse.  Does not go away.  You have blood or green matter (bile) in your vomit.  You have blood in your stool. This may cause stool to look black and tarry.  You have not urinated in 6-8 hours.  You faint.  Your heart rate while sitting still is over 100 beats a minute.  You have trouble breathing. This information is not intended to replace advice given to you by your health care provider. Make sure you discuss any questions you have with your health care provider. Document Released: 01/13/2005 Document Revised: 08/10/2015 Document Reviewed: 03/09/2015 Elsevier Interactive Patient Education  2017 [employer].

## 2016-04-20 ENCOUNTER — Emergency Department (HOSPITAL_BASED_OUTPATIENT_CLINIC_OR_DEPARTMENT_OTHER): Payer: 59

## 2016-04-20 ENCOUNTER — Inpatient Hospital Stay (HOSPITAL_BASED_OUTPATIENT_CLINIC_OR_DEPARTMENT_OTHER)
Admission: EM | Admit: 2016-04-20 | Discharge: 2016-04-25 | DRG: 640 | Disposition: A | Discharge status: Hospice | Payer: 59 | Attending: Family Medicine | Admitting: Family Medicine

## 2016-04-20 ENCOUNTER — Encounter (HOSPITAL_BASED_OUTPATIENT_CLINIC_OR_DEPARTMENT_OTHER): Payer: Self-pay | Admitting: *Deleted

## 2016-04-20 ENCOUNTER — Other Ambulatory Visit: Payer: Self-pay

## 2016-04-20 DIAGNOSIS — R188 Other ascites: Secondary | ICD-10-CM | POA: Diagnosis present

## 2016-04-20 DIAGNOSIS — E1165 Type 2 diabetes mellitus with hyperglycemia: Secondary | ICD-10-CM | POA: Diagnosis present

## 2016-04-20 DIAGNOSIS — Z79899 Other long term (current) drug therapy: Secondary | ICD-10-CM

## 2016-04-20 DIAGNOSIS — Z66 Do not resuscitate: Secondary | ICD-10-CM | POA: Diagnosis present

## 2016-04-20 DIAGNOSIS — K7460 Unspecified cirrhosis of liver: Secondary | ICD-10-CM | POA: Diagnosis present

## 2016-04-20 DIAGNOSIS — G928 Other toxic encephalopathy: Secondary | ICD-10-CM | POA: Diagnosis present

## 2016-04-20 DIAGNOSIS — R109 Unspecified abdominal pain: Secondary | ICD-10-CM

## 2016-04-20 DIAGNOSIS — B182 Chronic viral hepatitis C: Secondary | ICD-10-CM | POA: Diagnosis present

## 2016-04-20 DIAGNOSIS — I10 Essential (primary) hypertension: Secondary | ICD-10-CM | POA: Diagnosis present

## 2016-04-20 DIAGNOSIS — G92 Toxic encephalopathy: Secondary | ICD-10-CM | POA: Diagnosis present

## 2016-04-20 DIAGNOSIS — Z515 Encounter for palliative care: Secondary | ICD-10-CM | POA: Diagnosis present

## 2016-04-20 DIAGNOSIS — F17210 Nicotine dependence, cigarettes, uncomplicated: Secondary | ICD-10-CM | POA: Diagnosis present

## 2016-04-20 DIAGNOSIS — C220 Liver cell carcinoma: Secondary | ICD-10-CM | POA: Diagnosis present

## 2016-04-20 DIAGNOSIS — E1140 Type 2 diabetes mellitus with diabetic neuropathy, unspecified: Secondary | ICD-10-CM | POA: Diagnosis present

## 2016-04-20 DIAGNOSIS — N179 Acute kidney failure, unspecified: Secondary | ICD-10-CM | POA: Diagnosis present

## 2016-04-20 DIAGNOSIS — B1920 Unspecified viral hepatitis C without hepatic coma: Secondary | ICD-10-CM | POA: Diagnosis present

## 2016-04-20 DIAGNOSIS — Z7951 Long term (current) use of inhaled steroids: Secondary | ICD-10-CM

## 2016-04-20 DIAGNOSIS — K7290 Hepatic failure, unspecified without coma: Secondary | ICD-10-CM | POA: Diagnosis present

## 2016-04-20 DIAGNOSIS — N390 Urinary tract infection, site not specified: Secondary | ICD-10-CM | POA: Diagnosis present

## 2016-04-20 DIAGNOSIS — R4182 Altered mental status, unspecified: Secondary | ICD-10-CM

## 2016-04-20 DIAGNOSIS — K567 Ileus, unspecified: Secondary | ICD-10-CM | POA: Diagnosis present

## 2016-04-20 DIAGNOSIS — IMO0002 Reserved for concepts with insufficient information to code with codable children: Secondary | ICD-10-CM | POA: Diagnosis present

## 2016-04-20 DIAGNOSIS — Z888 Allergy status to other drugs, medicaments and biological substances status: Secondary | ICD-10-CM

## 2016-04-20 LAB — COMPREHENSIVE METABOLIC PANEL
ALBUMIN: 2.8 g/dL — AB (ref 3.5–5.0)
ALT: 49 U/L (ref 17–63)
ANION GAP: 9 (ref 5–15)
AST: 597 U/L — ABNORMAL HIGH (ref 15–41)
Alkaline Phosphatase: 224 U/L — ABNORMAL HIGH (ref 38–126)
BUN: 16 mg/dL (ref 6–20)
CALCIUM: 13.3 mg/dL — AB (ref 8.9–10.3)
CO2: 22 mmol/L (ref 22–32)
Chloride: 101 mmol/L (ref 101–111)
Creatinine, Ser: 0.99 mg/dL (ref 0.61–1.24)
GFR calc non Af Amer: >60 mL/min (ref >60)
GLUCOSE: 134 mg/dL — AB (ref 65–99)
POTASSIUM: 4 mmol/L (ref 3.5–5.1)
SODIUM: 132 mmol/L — AB (ref 135–145)
TOTAL PROTEIN: 7.5 g/dL (ref 6.5–8.1)
Total Bilirubin: 10.3 mg/dL — ABNORMAL HIGH (ref 0.3–1.2)

## 2016-04-20 LAB — URINALYSIS, ROUTINE W REFLEX MICROSCOPIC
Glucose, UA: NEGATIVE mg/dL
Hgb urine dipstick: NEGATIVE
Ketones, ur: 15 mg/dL — AB
NITRITE: POSITIVE — AB
PH: 5.5 (ref 5.0–8.0)
Protein, ur: 30 mg/dL — AB
SPECIFIC GRAVITY, URINE: 1.021 (ref 1.005–1.030)

## 2016-04-20 LAB — CBC
HEMATOCRIT: 37.8 % — AB (ref 39.0–52.0)
HEMOGLOBIN: 13.9 g/dL (ref 13.0–17.0)
MCH: 28.4 pg (ref 26.0–34.0)
MCHC: 36.8 g/dL — AB (ref 30.0–36.0)
MCV: 77.1 fL — ABNORMAL LOW (ref 78.0–100.0)
Platelets: 265 10*3/uL (ref 150–400)
RBC: 4.9 MIL/uL (ref 4.22–5.81)
RDW: 21.3 % — ABNORMAL HIGH (ref 11.5–15.5)
WBC: 8.8 10*3/uL (ref 4.0–10.5)

## 2016-04-20 LAB — URINALYSIS, MICROSCOPIC (REFLEX)

## 2016-04-20 LAB — AMMONIA: AMMONIA: 54 umol/L — AB (ref 9–35)

## 2016-04-20 LAB — I-STAT CG4 LACTIC ACID, ED: Lactic Acid, Venous: 1.34 mmol/L (ref 0.5–1.9)

## 2016-04-20 LAB — LIPASE, BLOOD: Lipase: 32 U/L (ref 11–51)

## 2016-04-20 MED ORDER — SODIUM CHLORIDE 0.9 % IV BOLUS (SEPSIS)
1000.0000 mL | Freq: Once | INTRAVENOUS | Status: AC
  Administered 2016-04-20: 1000 mL via INTRAVENOUS

## 2016-04-20 NOTE — ED Provider Notes (Signed)
[facility] DEPT [facility] Provider Note   CSN: [id number] Arrival date & time: 04/20/16  1930  By signing my name below, I, Dolores Hoose, attest that this documentation has been prepared under the direction and in the presence of non-physician practitioner, Doristine Devoid, PA-C. Electronically Signed: Dolores Hoose, Scribe. 04/20/2016. 7:54 PM.  History   Chief Complaint Chief Complaint  Patient presents with  . Abdominal Pain   LEVEL 5 CAVEAT DUE TO ALTERED MENTAL STATUS   The history is provided by the patient. No language interpreter was used.    HPI Comments:  Casey Reyes is a 61 y.o. male with pmhx of DM, HTN, and hepatocellular carcinoma who presents to the Emergency Department complaining of constant, moderate, unchanged abdominal pain onset today. Pt's caretaker states that the pt is typically very talkative but has been getting more and more unresponsive over the past 2-3 days. She reports associated diaphoresis and diarrhea. Wife at bedside states that patient's abdomen has been become more distended. Also complains of scleral icterus. She denies he has had any vomiting. Last bowel movement just PTA. Patient was admitted to 3 weeks ago after hypercalemia patient was discharged to follow-up with oncology. He is receiving chemotherapy currenlty.  Past Medical History:  Diagnosis Date  . Chicken pox   . Diabetes mellitus without complication ([place])   . Hepatitis C   . Hepatocellular carcinoma ([place]) 03/28/2016  . Hypertension     Patient Active Problem List   Diagnosis Date Noted  . AKI (acute kidney injury) ([place]) 04/09/2016  . Goals of care, counseling/discussion 04/05/2016  . Liver cirrhosis ([place]) 04/03/2016  . Hepatocellular carcinoma ([place]) 03/28/2016  . Hypercalcemia 03/27/2016  . Elevated liver enzymes 02/14/2016  . BPH associated with nocturia 02/14/2016  . Prostate cancer screening 06/04/2015  . Diabetic neuropathy ([place]) 08/03/2014  . Insomnia 04/17/2014  .  Hepatitis C 01/16/2014  . Diabetes mellitus type II, uncontrolled ([place]) 12/20/2013  . Visit for preventive health examination 07/24/2013  . Essential hypertension 07/24/2013  . Erectile dysfunction 07/24/2013    Past Surgical History:  Procedure Laterality Date  . FINGER SURGERY     Right-Tendon Cut  . WISDOM TOOTH EXTRACTION         Home Medications    Prior to Admission medications   Medication Sig Start Date End Date Taking? Authorizing Provider  albuterol (PROVENTIL) (2.5 MG/3ML) 0.083% nebulizer solution Take 3 mLs (2.5 mg total) by nebulization every 2 (two) hours as needed for wheezing. Patient not taking: Reported on 04/18/2016 03/31/16   Tawni Millers, MD  fluticasone [facility]) 50 MCG/ACT nasal spray Place 2 sprays into both nostrils daily. Patient taking differently: Place 2 sprays into both nostrils daily as needed for allergies.  03/27/16   Brunetta Jeans, PA-C  ketoconazole (NIZORAL) 2 % cream Apply 1 application topically daily. Patient taking differently: Apply 1 application topically daily as needed for irritation.  02/27/16   Trula Slade, DPM  loratadine (CLARITIN) 10 MG tablet Take 1 tablet (10 mg total) by mouth daily as needed for allergies. 01/30/15   Brunetta Jeans, PA-C  omeprazole (PRILOSEC OTC) 20 MG tablet Take 20 mg by mouth daily.    Historical Provider, MD  pregabalin (LYRICA) 100 MG capsule Take 100 mg by mouth 2 (two) times daily.    Historical Provider, MD  Respiratory Therapy Supplies (NEBULIZER COMPRESSOR) KIT 1 kit by Does not apply route every 6 (six) hours as needed. Patient not taking: Reported on 04/18/2016 03/31/16  Mauricio Gerome Apley, MD  tamsulosin (FLOMAX) 0.4 MG CAPS capsule TAKE TWO CAPSULES BY MOUTH ONCE DAILY 04/07/16   Brunetta Jeans, PA-C  traMADol (ULTRAM) 50 MG tablet Take 1 tablet (50 mg total) by mouth every 6 (six) hours as needed. 04/11/16   Truitt Merle, MD    Family History Family History  Problem Relation Age of  Onset  . Heart attack Father     Deceased  . Heart disease Father   . Diabetes Mother     Living  . Heart attack Brother 21    Deceased  . Sudden death Brother   . Thyroid disease Sister   . Diabetes Sister   . Healthy Sister     x1  . Colon cancer Brother     x1  . Heart disease Maternal Grandfather   . Allergies Daughter   . Diabetes Son   . Diabetes Maternal Grandmother     Social History Social History  Substance Use Topics  . Smoking status: Current Some Day Smoker    Packs/day: 0.25    Types: Cigarettes  . Smokeless tobacco: Never Used  . Alcohol use Yes     Allergies   Gadolinium derivatives and Eovist [gadoxetate]   Review of Systems Review of Systems  Unable to perform ROS: Mental status change     Physical Exam Updated Vital Signs BP 133/87 (BP Location: Right Arm)   Pulse (!) 107   Temp 97.6 F (36.4 C) (Oral)   Resp (!) 28   Wt 225 lb (102.1 kg)   SpO2 96%   BMI 35.24 kg/m   Physical Exam  Constitutional: He is oriented to person, place, and time. He appears well-developed and well-nourished. No distress.  Drowsy, non toxic appearing, slightly altered  HENT:  Head: Normocephalic and atraumatic.  Eyes: Conjunctivae and EOM are normal. Pupils are equal, round, and reactive to light. Scleral icterus is present.  Neck: Normal range of motion. Neck supple.  Cardiovascular: Normal rate, regular rhythm, normal heart sounds and intact distal pulses.   Pulmonary/Chest: Effort normal and breath sounds normal. No respiratory distress. He has no wheezes. He has no rales. He exhibits no tenderness.  Abdominal: Soft. He exhibits distension. Bowel sounds are increased. There is generalized tenderness. There is no rigidity, no rebound, no guarding and no CVA tenderness.  Musculoskeletal: Normal range of motion.  Lymphadenopathy:    He has no cervical adenopathy.  Neurological: He is alert and oriented to person, place, and time. He has normal strength  and normal reflexes. No cranial nerve deficit or sensory deficit. GCS eye subscore is 4. GCS verbal subscore is 5. GCS motor subscore is 6.  Slow to respond. No focal deficits.  Skin: Skin is warm and dry. Capillary refill takes less than 2 seconds.  Psychiatric: He has a normal mood and affect.  Nursing note and vitals reviewed.    ED Treatments / Results  DIAGNOSTIC STUDIES:  Oxygen Saturation is 96% on RA, adequate by my interpretation.    COORDINATION OF CARE:  8:32 PM Discussed treatment plan with caretaker at bedside which includes blood work and imaging and she agreed to plan.   Labs (all labs ordered are listed, but only abnormal results are displayed) Labs Reviewed  COMPREHENSIVE METABOLIC PANEL - Abnormal; Notable for the following:       Result Value   Sodium 132 (*)    Glucose, Bld 134 (*)    Calcium 13.3 (*)    Albumin 2.8 (*)  AST 597 (*)    Alkaline Phosphatase 224 (*)    Total Bilirubin 10.3 (*)    All other components within normal limits  CBC - Abnormal; Notable for the following:    HCT 37.8 (*)    MCV 77.1 (*)    MCHC 36.8 (*)    RDW 21.3 (*)    All other components within normal limits  URINALYSIS, ROUTINE W REFLEX MICROSCOPIC - Abnormal; Notable for the following:    Color, Urine ORANGE (*)    APPearance CLOUDY (*)    Bilirubin Urine LARGE (*)    Ketones, ur 15 (*)    Protein, ur 30 (*)    Nitrite POSITIVE (*)    Leukocytes, UA SMALL (*)    All other components within normal limits  AMMONIA - Abnormal; Notable for the following:    Ammonia 54 (*)    All other components within normal limits  URINALYSIS, MICROSCOPIC (REFLEX) - Abnormal; Notable for the following:    Bacteria, UA RARE (*)    Squamous Epithelial / LPF 0-5 (*)    All other components within normal limits  LIPASE, BLOOD  I-STAT CG4 LACTIC ACID, ED    EKG  EKG Interpretation  Date/Time:  Sunday April 20 2016 21:41:21 EDT Ventricular Rate:  89 PR Interval:    QRS  Duration: 95 QT Interval:  368 QTC Calculation: 448 R Axis:   15 Text Interpretation:  Sinus rhythm Anterior infarct, old Baseline wander in lead(s) V6 Confirmed by DELO  MD, DOUGLAS ([id number]) on 04/20/2016 10:00:14 PM       Radiology Dg Abdomen Acute W/chest  Result Date: 04/20/2016 CLINICAL DATA:  Acute abdominal pain, no fever, labral lesions EXAM: DG ABDOMEN ACUTE W/ 1V CHEST COMPARISON:  CT scan 03/29/2016 FINDINGS: Cardiomediastinal silhouette is unremarkable. No infiltrate or pleural effusion. Mild gaseous distended small bowel loops and distal abdomen probable mild ileus. No small bowel air-fluid levels. No evidence of free abdominal air. IMPRESSION: Mild gaseous distended small bowel loops in distal abdomen probable mild ileus. No small bowel air-fluid levels. No evidence of free abdominal air. No acute disease within chest. Electronically Signed   By: Lahoma Crocker M.D.   On: 04/20/2016 21:33   Ct Renal Stone Study  Result Date: 04/20/2016 CLINICAL DATA:  Generalized abdominal pain for 2 weeks. EXAM: CT ABDOMEN AND PELVIS WITHOUT CONTRAST TECHNIQUE: Multidetector CT imaging of the abdomen and pelvis was performed following the standard protocol without IV contrast. COMPARISON:  CT scan of March 29, 2016. FINDINGS: Lower chest: No acute abnormality. Hepatobiliary: No gallstones are noted. The liver is severely enlarged with nodular contours consistent with hepatic cirrhosis. It is heterogeneous in appearance on these unenhanced images, concerning for underlying hepatocellular carcinoma. Pancreas: Unremarkable. No pancreatic ductal dilatation or surrounding inflammatory changes. Spleen: Normal in size without focal abnormality. Adrenals/Urinary Tract: Adrenal glands are unremarkable. Kidneys are normal, without renal calculi, focal lesion, or hydronephrosis. Bladder is unremarkable. Stomach/Bowel: Stomach is within normal limits. Appendix appears normal. No evidence of bowel wall thickening,  distention, or inflammatory changes. Vascular/Lymphatic: Aortic atherosclerosis. No enlarged abdominal or pelvic lymph nodes. Reproductive: Mild prostatic enlargement is noted. Other: Mild ascites is noted.  No hernia is noted. Musculoskeletal: No acute or significant osseous findings. IMPRESSION: Hepatomegaly and hepatic cirrhosis is noted. Hepatic parenchyma is heterogeneous in appearance consistent with underlying masses, concerning for hepatocellular carcinoma. This is noted on prior exams. Aortic atherosclerosis. Mild ascites. Electronically Signed   By: Marijo Conception, M.D.  On: 04/20/2016 22:23    Procedures Procedures (including critical care time)  Medications Ordered in ED Medications  sodium chloride 0.9 % bolus 1,000 mL (0 mLs Intravenous Stopped 04/20/16 2211)     Initial Impression / Assessment and Plan / ED Course  I have reviewed the triage vital signs and the nursing notes.  Pertinent labs & imaging results that were available during my care of the patient were reviewed by me and considered in my medical decision making (see chart for details).    The patient presents to the ED with a past medical history significant for DM, hepatitis C, hepatocellular carcinoma currently undergoing chemotherapy with a generalized vague abdominal pain, diarrhea, altered mental status, sleepiness and somnolence. Exam reveals diffuse distended abdomen that seems nontender to palpation. Patient has no focal neuro deficits and he is oriented to person place and time however patient is very drowsy and sleepy however he is arousable. He does have increased bowel sounds. Today the bowel movement that was normal today. No emesis. Lactic is normal. No leukocytosis noted. Acute abdominal x-ray was obtained that shows possible developing ileus but no signs of obstruction. Slightly elevated ammonia at 54. Calcium is elevated at 13.3. Does have history of hypercalcemia. Liver function seems similar to prior  however total bilirubin has increased to 10. Patient's wife does report new onset scleral icterus. Creatinine is normal. Lipase is normal. Patient has no focal tenderness to the right upper quadrant. Have low suspicion for cholecystitis, cholangitis, choledocholithiasis. CT scan was obtained that showed Baseline liver findings but no acute findings. Urine with positive nitrites and leukocytes along with rare bacteria. No WBCs. Squamous epithelia were present. Patient denies any urinary symptoms. We'll send for culture. Doubt this is causing his altered mental status. Lactic acid was normal. No white count. He is afebrile with normal pressures. Pt does not meet sirs or sepsis criteria. Patient was given fluids for rehydration and to treat the hyperkalemia.Given elevating calcium and patient's somnolence, will need admission for further management of hypercalcemia and elevated bilrubin. Patient will be admitted to [facility] long for further evaluation. Spoke with Dr. Almyra Free who will place admission orders to telemetry for patient. Care link was called and patient was transported by EMS to [facility] long.     Final Clinical Impressions(s) / ED Diagnoses   Final diagnoses:  Hypercalcemia  Altered mental status, unspecified altered mental status type  Abdominal pain, unspecified abdominal location    New Prescriptions New Prescriptions   No medications on file  I personally performed the services described in this documentation, which was scribed in my presence. The recorded information has been reviewed and is accurate.      Doristine Devoid, PA-C 04/21/16 0115    Veryl Speak, MD 04/23/16 [phone]

## 2016-04-20 NOTE — ED Notes (Signed)
Pt transported to xray 

## 2016-04-20 NOTE — ED Notes (Signed)
PA aware that Ca is 13.3.

## 2016-04-20 NOTE — ED Notes (Signed)
Up to b/r, c/o increase in abd discomfort, "bloated since drinking contrast", steady gait, holding stomach and grimacing. RT and family present. Urine sent.

## 2016-04-20 NOTE — ED Notes (Signed)
Pt is a [facility] liver CA pt who presents with his wife with c/o abd pain, stomach upset (diarrhea) and general and BLE weakness. Ongoing for > 2 weeks, worse today. Pt jaundice, awake, slow and generally weak, NAD, calm, interactive, resps shallow e/u, sparse verbal communication, no dyspnea noted, skin W&D, VSS, also mentions subjective fever, nausea, (denies: vomiting). Family at [facility].

## 2016-04-20 NOTE — ED Notes (Signed)
Pt back from CT, no changes, resting, sleeping, NAD, calm, interactive, "feel better", repositioning self in bed, VSS.

## 2016-04-20 NOTE — ED Notes (Signed)
Delay in EKG due to MD in with pt

## 2016-04-20 NOTE — ED Notes (Signed)
Dr. Stark Jock at [facility] speaking with pt and wife.

## 2016-04-21 ENCOUNTER — Telehealth: Payer: Self-pay | Admitting: Physician Assistant

## 2016-04-21 DIAGNOSIS — K567 Ileus, unspecified: Secondary | ICD-10-CM | POA: Diagnosis present

## 2016-04-21 DIAGNOSIS — E1165 Type 2 diabetes mellitus with hyperglycemia: Secondary | ICD-10-CM | POA: Diagnosis present

## 2016-04-21 DIAGNOSIS — R401 Stupor: Secondary | ICD-10-CM

## 2016-04-21 DIAGNOSIS — G92 Toxic encephalopathy: Secondary | ICD-10-CM | POA: Diagnosis present

## 2016-04-21 DIAGNOSIS — G928 Other toxic encephalopathy: Secondary | ICD-10-CM | POA: Diagnosis present

## 2016-04-21 DIAGNOSIS — C220 Liver cell carcinoma: Secondary | ICD-10-CM | POA: Diagnosis present

## 2016-04-21 DIAGNOSIS — N390 Urinary tract infection, site not specified: Secondary | ICD-10-CM | POA: Diagnosis present

## 2016-04-21 DIAGNOSIS — Z79899 Other long term (current) drug therapy: Secondary | ICD-10-CM | POA: Diagnosis not present

## 2016-04-21 DIAGNOSIS — N179 Acute kidney failure, unspecified: Secondary | ICD-10-CM | POA: Diagnosis present

## 2016-04-21 DIAGNOSIS — Z7951 Long term (current) use of inhaled steroids: Secondary | ICD-10-CM | POA: Diagnosis not present

## 2016-04-21 DIAGNOSIS — Z888 Allergy status to other drugs, medicaments and biological substances status: Secondary | ICD-10-CM | POA: Diagnosis not present

## 2016-04-21 DIAGNOSIS — Z515 Encounter for palliative care: Secondary | ICD-10-CM | POA: Diagnosis present

## 2016-04-21 DIAGNOSIS — R188 Other ascites: Secondary | ICD-10-CM | POA: Diagnosis present

## 2016-04-21 DIAGNOSIS — E1142 Type 2 diabetes mellitus with diabetic polyneuropathy: Secondary | ICD-10-CM | POA: Diagnosis not present

## 2016-04-21 DIAGNOSIS — F17210 Nicotine dependence, cigarettes, uncomplicated: Secondary | ICD-10-CM | POA: Diagnosis present

## 2016-04-21 DIAGNOSIS — I10 Essential (primary) hypertension: Secondary | ICD-10-CM | POA: Diagnosis present

## 2016-04-21 DIAGNOSIS — K7460 Unspecified cirrhosis of liver: Secondary | ICD-10-CM | POA: Diagnosis present

## 2016-04-21 DIAGNOSIS — R4182 Altered mental status, unspecified: Secondary | ICD-10-CM | POA: Diagnosis present

## 2016-04-21 DIAGNOSIS — Z66 Do not resuscitate: Secondary | ICD-10-CM | POA: Diagnosis present

## 2016-04-21 DIAGNOSIS — E1140 Type 2 diabetes mellitus with diabetic neuropathy, unspecified: Secondary | ICD-10-CM | POA: Diagnosis present

## 2016-04-21 DIAGNOSIS — B182 Chronic viral hepatitis C: Secondary | ICD-10-CM | POA: Diagnosis present

## 2016-04-21 DIAGNOSIS — K7290 Hepatic failure, unspecified without coma: Secondary | ICD-10-CM | POA: Diagnosis present

## 2016-04-21 DIAGNOSIS — K7469 Other cirrhosis of liver: Secondary | ICD-10-CM | POA: Diagnosis not present

## 2016-04-21 DIAGNOSIS — B1920 Unspecified viral hepatitis C without hepatic coma: Secondary | ICD-10-CM | POA: Diagnosis present

## 2016-04-21 DIAGNOSIS — Z7189 Other specified counseling: Secondary | ICD-10-CM | POA: Diagnosis not present

## 2016-04-21 LAB — COMPREHENSIVE METABOLIC PANEL
ALT: 42 U/L (ref 17–63)
ANION GAP: 9 (ref 5–15)
AST: 562 U/L — AB (ref 15–41)
Albumin: 2.5 g/dL — ABNORMAL LOW (ref 3.5–5.0)
Alkaline Phosphatase: 176 U/L — ABNORMAL HIGH (ref 38–126)
BUN: 21 mg/dL — ABNORMAL HIGH (ref 6–20)
CALCIUM: 12.2 mg/dL — AB (ref 8.9–10.3)
CO2: 20 mmol/L — AB (ref 22–32)
Chloride: 105 mmol/L (ref 101–111)
Creatinine, Ser: 1.53 mg/dL — ABNORMAL HIGH (ref 0.61–1.24)
GFR calc non Af Amer: 47 mL/min — ABNORMAL LOW (ref >60)
GFR, EST AFRICAN AMERICAN: 55 mL/min — AB (ref >60)
Glucose, Bld: 114 mg/dL — ABNORMAL HIGH (ref 65–99)
Potassium: 4.2 mmol/L (ref 3.5–5.1)
Sodium: 134 mmol/L — ABNORMAL LOW (ref 135–145)
Total Bilirubin: 9.1 mg/dL — ABNORMAL HIGH (ref 0.3–1.2)
Total Protein: 6.6 g/dL (ref 6.5–8.1)

## 2016-04-21 LAB — GLUCOSE, CAPILLARY
GLUCOSE-CAPILLARY: 74 mg/dL (ref 65–99)
Glucose-Capillary: 140 mg/dL — ABNORMAL HIGH (ref 65–99)
Glucose-Capillary: 91 mg/dL (ref 65–99)
Glucose-Capillary: 93 mg/dL (ref 65–99)
Glucose-Capillary: 106 mg/dL — ABNORMAL HIGH (ref 65–99)

## 2016-04-21 LAB — AMMONIA: AMMONIA: 73 umol/L — AB (ref 9–35)

## 2016-04-21 MED ORDER — LACTULOSE 10 GM/15ML PO SOLN
30.0000 g | Freq: Three times a day (TID) | ORAL | Status: DC
  Administered 2016-04-22 – 2016-04-25 (×8): 30 g via ORAL
  Filled 2016-04-21 (×8): qty 45

## 2016-04-21 MED ORDER — CEFTRIAXONE SODIUM 1 G IJ SOLR
1.0000 g | INTRAMUSCULAR | Status: DC
Start: 2016-04-21 — End: 2016-04-24
  Administered 2016-04-21 – 2016-04-24 (×4): 1 g via INTRAVENOUS
  Filled 2016-04-21 (×4): qty 10

## 2016-04-21 MED ORDER — ENSURE ENLIVE PO LIQD
237.0000 mL | Freq: Two times a day (BID) | ORAL | Status: DC
  Administered 2016-04-22: 237 mL via ORAL

## 2016-04-21 MED ORDER — BOOST / RESOURCE BREEZE PO LIQD
1.0000 | Freq: Three times a day (TID) | ORAL | Status: DC
  Administered 2016-04-21 – 2016-04-24 (×5): 1 via ORAL

## 2016-04-21 MED ORDER — ORAL CARE MOUTH RINSE
15.0000 mL | Freq: Two times a day (BID) | OROMUCOSAL | Status: DC
  Administered 2016-04-22 – 2016-04-23 (×4): 15 mL via OROMUCOSAL

## 2016-04-21 MED ORDER — LACTULOSE 10 GM/15ML PO SOLN
30.0000 g | Freq: Two times a day (BID) | ORAL | Status: DC
  Administered 2016-04-21 (×2): 30 g via ORAL
  Filled 2016-04-21 (×2): qty 45

## 2016-04-21 MED ORDER — CALCITONIN (SALMON) 200 UNIT/ML IJ SOLN
400.0000 [IU] | Freq: Two times a day (BID) | INTRAMUSCULAR | Status: DC
  Administered 2016-04-21 – 2016-04-25 (×9): 400 [IU] via INTRAMUSCULAR
  Filled 2016-04-21 (×10): qty 2

## 2016-04-21 MED ORDER — INSULIN ASPART 100 UNIT/ML ~~LOC~~ SOLN
0.0000 [IU] | SUBCUTANEOUS | Status: DC
  Administered 2016-04-21 – 2016-04-22 (×3): 1 [IU] via SUBCUTANEOUS
  Administered 2016-04-23: 2 [IU] via SUBCUTANEOUS
  Administered 2016-04-23 – 2016-04-24 (×3): 1 [IU] via SUBCUTANEOUS
  Administered 2016-04-24: 2 [IU] via SUBCUTANEOUS

## 2016-04-21 MED ORDER — SODIUM CHLORIDE 0.9 % IV SOLN
INTRAVENOUS | Status: DC
  Administered 2016-04-21 – 2016-04-24 (×7): via INTRAVENOUS

## 2016-04-21 MED ORDER — LACTULOSE 10 GM/15ML PO SOLN: 30.0000 g | Freq: Three times a day (TID) | ORAL | Status: DC

## 2016-04-21 MED ORDER — TAMSULOSIN HCL 0.4 MG PO CAPS
0.8000 mg | ORAL_CAPSULE | Freq: Every day | ORAL | Status: DC
  Administered 2016-04-21 – 2016-04-25 (×4): 0.8 mg via ORAL
  Filled 2016-04-21 (×5): qty 2

## 2016-04-21 MED ORDER — FUROSEMIDE 10 MG/ML IJ SOLN
40.0000 mg | Freq: Once | INTRAMUSCULAR | Status: AC
  Administered 2016-04-21: 40 mg via INTRAVENOUS
  Filled 2016-04-21: qty 4

## 2016-04-21 MED ORDER — PREGABALIN 50 MG PO CAPS
100.0000 mg | ORAL_CAPSULE | Freq: Two times a day (BID) | ORAL | Status: DC
  Administered 2016-04-21 – 2016-04-25 (×6): 100 mg via ORAL
  Filled 2016-04-21 (×8): qty 2

## 2016-04-21 MED ORDER — ENOXAPARIN SODIUM 40 MG/0.4ML ~~LOC~~ SOLN
40.0000 mg | SUBCUTANEOUS | Status: DC
  Administered 2016-04-21: 40 mg via SUBCUTANEOUS
  Filled 2016-04-21: qty 0.4

## 2016-04-21 MED ORDER — PANTOPRAZOLE SODIUM 40 MG PO TBEC
40.0000 mg | DELAYED_RELEASE_TABLET | Freq: Every day | ORAL | Status: DC
  Administered 2016-04-21 – 2016-04-23 (×3): 40 mg via ORAL
  Filled 2016-04-21 (×4): qty 1

## 2016-04-21 MED ORDER — CHLORHEXIDINE GLUCONATE 0.12 % MT SOLN
15.0000 mL | Freq: Two times a day (BID) | OROMUCOSAL | Status: DC
  Administered 2016-04-21 – 2016-04-25 (×6): 15 mL via OROMUCOSAL
  Filled 2016-04-21 (×6): qty 15

## 2016-04-21 MED ORDER — ALBUTEROL SULFATE (2.5 MG/3ML) 0.083% IN NEBU
2.5000 mg | INHALATION_SOLUTION | RESPIRATORY_TRACT | Status: DC | PRN
Start: 2016-04-21 — End: 2016-04-25

## 2016-04-21 NOTE — Care Management Note (Signed)
Case Management Note  Patient Details  Name: Casey Reyes MRN: [id number] Date of Birth: 26-Feb-1955  Subjective/Objective: 61 y/o m admitted w/Toxic Encephalopathy. From home w/spouse. Referral for assist w/affording meds. Has [employer] w/script coverage-spoke to spouse in rm-said we have a pharmacy, & are able to get are meds without any difficulty. PT cons-await recc.                    Action/Plan:d/c plan home.   Expected Discharge Date:                  Expected Discharge Plan:  [place]  In-House Referral:     Discharge planning Services  CM Consult  Post Acute Care Choice:    Choice offered to:     DME Arranged:    DME Agency:     HH Arranged:    [place] Agency:     Status of Service:  In process, will continue to follow  If discussed at Long Length of Stay Meetings, dates discussed:    Additional Comments:  Dessa Phi, RN 04/21/2016, 1:18 PM

## 2016-04-21 NOTE — Telephone Encounter (Signed)
Wife LMOVM asking for a call back from [place], she states that pt is in hosp and would like to talke to [place].

## 2016-04-21 NOTE — H&P (Signed)
History and Physical    Casey Reyes VOJ:[id number] DOB: [birth year] DOA: 04/20/2016  PCP: Casey Reyes, Casey Reyes  Patient coming from: Home  I have personally briefly reviewed patient's old medical records in [place]  Chief Complaint: AMS  HPI: Casey Reyes is a 61 y.o. male with medical history significant of DM, HTN, [place] stage 4 currently on chemotherapy with Dr. Krista Blue.  Patient was seen in the oncology office 2 days ago with generalized weakness, lethargy, and in general not being very interactive quality altered mental status.  Work up in the office at that time revealed a Calcium level of 14.1 (much higher even than his hypercalcemia admission earlier this month).  He was treated with Zometa at that time.  Over the past 2-3 days he has become more and more unresponsive.  Now has generalized mental slowing, generalized weakness, diarrhea, abdominal distention.  Patient isnt able to add much to history at this point, mental status is significantly slowed though he will wake up, open eyes, and eventually answer questions.   ED Course: Calcium 13.4!  Bili 10.x, AST 500.  Ammonia 54.  CT abd renal stone protocol shows cirrhosis and findings "concerning for hepatocellular carcinoma" (both of these are known and chronic).  KUB suggests mild ileus.  Review of Systems: As per HPI otherwise 10 point review of systems negative.  Past Medical History:  Diagnosis Date  . Chicken pox   . Diabetes mellitus without complication ([place])   . Hepatitis C   . Hepatocellular carcinoma ([place]) 03/28/2016  . Hypertension     Past Surgical History:  Procedure Laterality Date  . FINGER SURGERY     Right-Tendon Cut  . WISDOM TOOTH EXTRACTION       reports that he has been smoking Cigarettes.  He has been smoking about 0.25 packs per day. He has never used smokeless tobacco. He reports that he drinks alcohol. He reports that he does not use drugs.  Allergies  Allergen Reactions  . Gadolinium  Derivatives Hives and Itching  . Eovist [Gadoxetate] Hives and Itching    Hives and itching over face and chest after 10 ml Eovist given. 50 mg Benadryl given and he was observed by nurse and Dr. Jeralyn Ruths until symptoms improved and he was released. ry    Family History  Problem Relation Age of Onset  . Heart attack Father     Deceased  . Heart disease Father   . Diabetes Mother     Living  . Heart attack Brother 26    Deceased  . Sudden death Brother   . Thyroid disease Sister   . Diabetes Sister   . Healthy Sister     x1  . Colon cancer Brother     x1  . Heart disease Maternal Grandfather   . Allergies Daughter   . Diabetes Son   . Diabetes Maternal Grandmother      Prior to Admission medications   Medication Sig Start Date End Date Taking? Authorizing Provider  albuterol (PROVENTIL) (2.5 MG/3ML) 0.083% nebulizer solution Take 3 mLs (2.5 mg total) by nebulization every 2 (two) hours as needed for wheezing. 03/31/16  Yes Mauricio Gerome Apley, MD  fluticasone (FLONASE) 50 MCG/ACT nasal spray Place 2 sprays into both nostrils daily. Patient taking differently: Place 2 sprays into both nostrils daily as needed for allergies.  03/27/16  Yes Brunetta Jeans, Casey Reyes  ketoconazole (NIZORAL) 2 % cream Apply 1 application topically daily. Patient taking differently: Apply 1 application  topically daily as needed for irritation.  02/27/16  Yes Trula Slade, DPM  loratadine (CLARITIN) 10 MG tablet Take 1 tablet (10 mg total) by mouth daily as needed for allergies. 01/30/15  Yes Brunetta Jeans, Casey Reyes  losartan (COZAAR) 50 MG tablet Take 50 mg by mouth daily.   Yes Historical Provider, MD  omeprazole (PRILOSEC OTC) 20 MG tablet Take 20 mg by mouth daily.   Yes Historical Provider, MD  pregabalin (LYRICA) 100 MG capsule Take 100 mg by mouth 2 (two) times daily.   Yes Historical Provider, MD  tamsulosin (FLOMAX) 0.4 MG CAPS capsule TAKE TWO CAPSULES BY MOUTH ONCE DAILY Patient taking  differently: TAKE 0.8 MG BY MOUTH ONCE DAILY 04/07/16  Yes Brunetta Jeans, Casey Reyes  traMADol (ULTRAM) 50 MG tablet Take 1 tablet (50 mg total) by mouth every 6 (six) hours as needed. Patient taking differently: Take 50 mg by mouth every 6 (six) hours as needed for moderate pain or severe pain.  04/11/16  Yes Truitt Merle, MD  traMADol-acetaminophen (ULTRACET) 37.5-325 MG tablet Take 1 tablet by mouth every 6 (six) hours as needed for moderate pain or severe pain.   Yes Historical Provider, MD    Physical Exam: Vitals:   04/21/16 0030 04/21/16 0045 04/21/16 0107 04/21/16 0200  BP: 94/63 95/60  105/73  Pulse: 90 86  96  Resp: (!) 23 (!) 23  (!) 22  Temp:   97.8 F (36.6 C) 97.4 F (36.3 C)  TempSrc:   Oral Oral  SpO2: 96% 97%  99%  Weight:    103 kg (227 lb 1.2 oz)  Height:    5\' 7"  (1.702 m)    Constitutional: NAD, calm, comfortable Eyes: PERRL,Sclera are icteric ENMT: Mucous membranes are moist. Posterior pharynx clear of any exudate or lesions.Normal dentition.  Neck: normal, supple, no masses, no thyromegaly Respiratory: clear to auscultation bilaterally, no wheezing, no crackles. Normal respiratory effort. No accessory muscle use.  Cardiovascular: Regular rate and rhythm, no murmurs / rubs / gallops. No extremity edema. 2+ pedal pulses. No carotid bruits.  Abdomen: Distended, generalized tenderness Musculoskeletal: no clubbing / cyanosis. No joint deformity upper and lower extremities. Good ROM, no contractures. Normal muscle tone.  Skin: no rashes, lesions, ulcers. No induration Neurologic: CN 2-12 grossly intact. Sensation intact, DTR normal. Strength 5/5 in all 4.  Psychiatric: Significant generalized mental status slowing, slow to respond and answer questions.   Labs on Admission: I have personally reviewed following labs and imaging studies  CBC:  Recent Labs Lab 04/18/16 1318 04/20/16 1945  WBC 6.5 8.8  NEUTROABS 4.2  --   HGB 13.3 13.9  HCT 38.4 37.8*  MCV 82.4 77.1*    PLT 258 932   Basic Metabolic Panel:  Recent Labs Lab 04/18/16 1318 04/20/16 1945  NA 133* 132*  K 4.5 4.0  CL  --  101  CO2 21* 22  GLUCOSE 181* 134*  BUN 17.1 16  CREATININE 1.2 0.99  CALCIUM 14.1* 13.3*   GFR: Estimated Creatinine Clearance: 89.7 mL/min (by C-G formula based on SCr of 0.99 mg/dL). Liver Function Tests:  Recent Labs Lab 04/18/16 1318 04/20/16 1945  AST 656* 597*  ALT 50 49  ALKPHOS 273* 224*  BILITOT 6.48* 10.3*  PROT 7.3 7.5  ALBUMIN 2.8* 2.8*    Recent Labs Lab 04/20/16 1945  LIPASE 32    Recent Labs Lab 04/20/16 2028  AMMONIA 54*   Coagulation Profile: No results for input(s): INR, PROTIME in  the last 168 hours. Cardiac Enzymes: No results for input(s): CKTOTAL, CKMB, CKMBINDEX, TROPONINI in the last 168 hours. BNP (last 3 results) No results for input(s): PROBNP in the last 8760 hours. HbA1C: No results for input(s): HGBA1C in the last 72 hours. CBG: No results for input(s): GLUCAP in the last 168 hours. Lipid Profile: No results for input(s): CHOL, HDL, LDLCALC, TRIG, CHOLHDL, LDLDIRECT in the last 72 hours. Thyroid Function Tests:  Recent Labs  04/18/16 1318  TSH 0.764   Anemia Panel: No results for input(s): VITAMINB12, FOLATE, FERRITIN, TIBC, IRON, RETICCTPCT in the last 72 hours. Urine analysis:    Component Value Date/Time   COLORURINE ORANGE (A) 04/20/2016 2021   APPEARANCEUR CLOUDY (A) 04/20/2016 2021   LABSPEC 1.021 04/20/2016 2021   PHURINE 5.5 04/20/2016 2021   GLUCOSEU NEGATIVE 04/20/2016 2021   GLUCOSEU >=1000 (A) 02/08/2016 1231   HGBUR NEGATIVE 04/20/2016 2021   BILIRUBINUR LARGE (A) 04/20/2016 2021   KETONESUR 15 (A) 04/20/2016 2021   PROTEINUR 30 (A) 04/20/2016 2021   UROBILINOGEN 0.2 02/08/2016 1231   NITRITE POSITIVE (A) 04/20/2016 2021   LEUKOCYTESUR SMALL (A) 04/20/2016 2021    Radiological Exams on Admission: Dg Abdomen Acute W/chest  Result Date: 04/20/2016 CLINICAL DATA:  Acute  abdominal pain, no fever, labral lesions EXAM: DG ABDOMEN ACUTE W/ 1V CHEST COMPARISON:  CT scan 03/29/2016 FINDINGS: Cardiomediastinal silhouette is unremarkable. No infiltrate or pleural effusion. Mild gaseous distended small bowel loops and distal abdomen probable mild ileus. No small bowel air-fluid levels. No evidence of free abdominal air. IMPRESSION: Mild gaseous distended small bowel loops in distal abdomen probable mild ileus. No small bowel air-fluid levels. No evidence of free abdominal air. No acute disease within chest. Electronically Signed   By: Lahoma Crocker M.D.   On: 04/20/2016 21:33   Ct Renal Stone Study  Result Date: 04/20/2016 CLINICAL DATA:  Generalized abdominal pain for 2 weeks. EXAM: CT ABDOMEN AND PELVIS WITHOUT CONTRAST TECHNIQUE: Multidetector CT imaging of the abdomen and pelvis was performed following the standard protocol without IV contrast. COMPARISON:  CT scan of March 29, 2016. FINDINGS: Lower chest: No acute abnormality. Hepatobiliary: No gallstones are noted. The liver is severely enlarged with nodular contours consistent with hepatic cirrhosis. It is heterogeneous in appearance on these unenhanced images, concerning for underlying hepatocellular carcinoma. Pancreas: Unremarkable. No pancreatic ductal dilatation or surrounding inflammatory changes. Spleen: Normal in size without focal abnormality. Adrenals/Urinary Tract: Adrenal glands are unremarkable. Kidneys are normal, without renal calculi, focal lesion, or hydronephrosis. Bladder is unremarkable. Stomach/Bowel: Stomach is within normal limits. Appendix appears normal. No evidence of bowel wall thickening, distention, or inflammatory changes. Vascular/Lymphatic: Aortic atherosclerosis. No enlarged abdominal or pelvic lymph nodes. Reproductive: Mild prostatic enlargement is noted. Other: Mild ascites is noted.  No hernia is noted. Musculoskeletal: No acute or significant osseous findings. IMPRESSION: Hepatomegaly and hepatic  cirrhosis is noted. Hepatic parenchyma is heterogeneous in appearance consistent with underlying masses, concerning for hepatocellular carcinoma. This is noted on prior exams. Aortic atherosclerosis. Mild ascites. Electronically Signed   By: Marijo Conception, M.D.   On: 04/20/2016 22:23    EKG: Independently reviewed.  Assessment/Plan Principal Problem:   Toxic metabolic encephalopathy Active Problems:   Diabetes mellitus type II, uncontrolled (HCC)   Hypercalcemia   Hepatocellular carcinoma ([place])   Liver cirrhosis ([place])   Ileus ([place])    1. Toxic metabolic encephalopathy - most likely at this point is either due to hypercalcemia and / or hepatic encephalopathy.  1. Ammonia level is only 54 so hypercalcemia seems somewhat more likely 2. Will treat hypercalcemia first, as his underlying mild ileus is very likely to be due to this, and I dont really want to give large doses of lactulose to a patient with ileus. 2. Hypercalcemia - 1. IVF: NS 1L bolus in ED, 150 cc/hr currently ordered for on floor 2. Received Zometa for calcium of 14.1 on the 23rd at oncologists office, calcium now down to 13.3, but cannot repeat zometa for at least 7 days absolute minimum per pharmacy. 3. Therefore as is typical with calcium level >14 (as it was on the 23rd), will try to treat with calcitonin 4mg /kg IV Q12H 4. Repeat CMP ordered for AM 3. Liver cirrhosis - 1. Chronic, bilirubin appears somewhat up from baseline at 10.3, ammonia is 54.  If AMS persists despite improvement in calcium, then next step would be to consider lactulose treatment. 2. As mentioned above however, dont really want to start this while ileus is present 4. DM2 - 1. SSI Q4H sensitive scale 5. [place] - is currently seeing oncology and on chemo by Dr. Krista Blue  DVT prophylaxis: Lovenox Code Status: Full Family Communication: No family in room Disposition Plan: Home after admit Consults called: None, left consult in epic for oncology (not  called) Admission status: Admit to inpatient for treatment of toxic encephalopathy   Etta Quill DO [facility] Pager [phone]  If 7AM-7PM, please contact day team taking care of patient [email or web address] Password [email or web address]  04/21/2016, 2:45 AM

## 2016-04-21 NOTE — Progress Notes (Signed)
Initial Nutrition Assessment  DOCUMENTATION CODES:   Obesity unspecified  INTERVENTION:   Ensure Enlive po BID, each supplement provides 350 kcal and 20 grams of protein  Boost Breeze po TID, each supplement provides 250 kcal and 9 grams of protein  NUTRITION DIAGNOSIS:   Inadequate oral intake related to poor appetite, acute illness as evidenced by meal completion < 50%.  GOAL:   Patient will meet greater than or equal to 90% of their needs  MONITOR:   PO intake, Supplement acceptance, Labs, Weight trends  REASON FOR ASSESSMENT:   Malnutrition Screening Tool    ASSESSMENT:   61 y.o. male with medical history significant of DM, HTN, [place] stage 4 currently on chemotherapy with Dr. Krista Blue.  Patient was seen in the oncology office 2 days ago with generalized weakness, lethargy, and in general not being very interactive quality altered mental status.  Work up in the office at that time revealed a Calcium level of 14.1 (much higher even than his hypercalcemia admission earlier this month). Patient admitted for toxic encephalopathy and ileus   Met with pt and pt's wife in room today. Pt is very weak and unresponsive; will open eyes up and look around but does not communicate. History obtained from pt's wife who reports that patient has been listless with poor appetite for several days. Wife reports that pt has been having abdominal distension and diarrhea. Pt was found to have mild ileus on KUB. Pt advanced to clear liquid diet today. Wife reports that pt ate one procycle, 1/3 of his bowl of beef broth, a few bites of jello, and drank 1 1/2 cups of water for lunch today. RD discussed with wife the importance of protein intake. Wife would like for patient to have Boost Breeze while on a clear liquid diet. Wife will encourage po intake of meals and supplements. Per chart, pt has lost 18lbs(7%) in 2 1/2 months; this is significant given history.   Medications reviewed and include: calcitonin,  ceftriaxone, lovenox, insulin, protonix  Labs reviewed: Na 134(L), bun 21(H), creat 1.53(H), Ca 12.2(H), AlkPhos 176(H), alb 2.5(L), AST 562(H), ammonia 73(H), tbili 9.1(H) cbgs- 134, 114 x 24 hrs AIC 10.6(H) 01/2015  Nutrition-Focused physical exam completed. Findings are no fat depletion, moderate muscle depletion in temporal, clavicular, and patellar regions, and moderate edema. Pt is icteric.   Diet Order:  Diet clear liquid Room service appropriate? Yes; Fluid consistency: Thin  Skin:  Reviewed, no issues  Last BM:  3/23  Height:   Ht Readings from Last 1 Encounters:  04/21/16 '5\' 7"'$  (1.702 m)    Weight:   Wt Readings from Last 1 Encounters:  04/21/16 227 lb 1.2 oz (103 kg)    Ideal Body Weight:  67.3 kg  BMI:  Body mass index is 35.56 kg/m.  Estimated Nutritional Needs:   Kcal:  2000-2300kcal/day   Protein:  762831 g/day   Fluid:  >2L/day   EDUCATION NEEDS:   No education needs identified at this time  Koleen Distance, RD, [place] Pager #- [phone]

## 2016-04-21 NOTE — Progress Notes (Signed)
Pharmacy Antibiotic Note  Casey Reyes is a 61 y.o. male admitted on 04/20/2016 with medical history significant of DM, HTN, [place] stage 4 currently on chemotherapy. Over the past 2-3 days has become more unresponsive.  Pharmacy has been consulted for ceftriaxone for UTI.  Plan: Ceftriaxone 1gm IV q24h Pharmacy will sign off as no adjustment needed in renal dysfunction  Height: 5\' 7"  (170.2 cm) Weight: 227 lb 1.2 oz (103 kg) IBW/kg (Calculated) : 66.1  Temp (24hrs), Avg:97.7 F (36.5 C), Min:97.4 F (36.3 C), Max:98.4 F (36.9 C)   Recent Labs Lab 04/18/16 1318 04/20/16 1945 04/20/16 2002 04/21/16 0513  WBC 6.5 8.8  --   --   CREATININE 1.2 0.99  --  1.53*  LATICACIDVEN  --   --  1.34  --     Estimated Creatinine Clearance: 58 mL/min (A) (by C-G formula based on SCr of 1.53 mg/dL (H)).    Allergies  Allergen Reactions  . Gadolinium Derivatives Hives and Itching  . Eovist [Gadoxetate] Hives and Itching    Hives and itching over face and chest after 10 ml Eovist given. 50 mg Benadryl given and he was observed by nurse and Dr. Jeralyn Ruths until symptoms improved and he was released. ry    Antimicrobials this admission: 3/26 ceftriaxone >>  Microbiology results:  3/26 UCx: sent Thank you for allowing pharmacy to be a part of this patient's care.  Dolly Rias RPh 04/21/2016, 11:12 AM Pager [phone]

## 2016-04-21 NOTE — Progress Notes (Signed)
Chaplain stopped by to talk with patient / family regarding Advanced Directive per consult.  Patient is sleep and does not respond to Chaplain calling his name.  There is no family present in the room.  Chaplain will speak with patient's nurse and ask for Chaplain to be paged should family arrive to explain paperwork for AD.    Berneice Heinrich

## 2016-04-21 NOTE — ED Notes (Signed)
Pt/family updated, no changes, NAD, calm, interactive, resps e/u, no dyspnea noted.

## 2016-04-21 NOTE — Progress Notes (Signed)
CSW received inappropriate consult. CM Juliann Pulse notified that patient is having issues purchasing medications. CSW signing off, please consult if new needs arise.   Abundio Miu, [place] Social Worker [facility] Cell#: [phone]

## 2016-04-21 NOTE — Progress Notes (Signed)
[facility]  Interval Note   Patient admitted after midnight see H&P for full details  61 year old male with medical history significant for diabetes, hypertension, hepatocellular carcinoma stage IV currently on chemotherapy. Presented to the emergency department with generalized weakness and lethargy and confusion. Patient was found to have a calcium level of 14.1 and a workup at the cancer center. Was treated with Zometa. Patient admitted acute metabolic encephalopathy secondary to hypercalcemia, hepatic encephalopathy and UTI.  Patient seen this morning seems more alert answer some questions appropriately.   Impression Acute metabolic encephalopathy Hepatic encephalopathy UTI Hypercalcemia Hepatocellular carcinoma stage IV  Plan Given elevated ammonia will start patient on lactulose 30 mg twice a day Continue IV fluids and calcitonin will add Lasix 40 mg IV. We'll start ceftriaxone for UTI, is also we'll treat prophylactically SBP given patient have mild ascites. Check CMP and CBC in the morning. Follow-up urine cultures We'll continue to monitor  Chipper Oman, MD

## 2016-04-22 ENCOUNTER — Telehealth: Payer: Self-pay | Admitting: *Deleted

## 2016-04-22 DIAGNOSIS — E1142 Type 2 diabetes mellitus with diabetic polyneuropathy: Secondary | ICD-10-CM

## 2016-04-22 DIAGNOSIS — E1165 Type 2 diabetes mellitus with hyperglycemia: Secondary | ICD-10-CM

## 2016-04-22 DIAGNOSIS — N179 Acute kidney failure, unspecified: Secondary | ICD-10-CM

## 2016-04-22 DIAGNOSIS — K7469 Other cirrhosis of liver: Secondary | ICD-10-CM

## 2016-04-22 LAB — URINE CULTURE: CULTURE: NO GROWTH

## 2016-04-22 LAB — GLUCOSE, CAPILLARY
GLUCOSE-CAPILLARY: 101 mg/dL — AB (ref 65–99)
GLUCOSE-CAPILLARY: 120 mg/dL — AB (ref 65–99)
GLUCOSE-CAPILLARY: 126 mg/dL — AB (ref 65–99)
GLUCOSE-CAPILLARY: 144 mg/dL — AB (ref 65–99)
Glucose-Capillary: 108 mg/dL — ABNORMAL HIGH (ref 65–99)
Glucose-Capillary: 140 mg/dL — ABNORMAL HIGH (ref 65–99)
Glucose-Capillary: 93 mg/dL (ref 65–99)

## 2016-04-22 LAB — COMPREHENSIVE METABOLIC PANEL
ALT: 50 U/L (ref 17–63)
AST: 869 U/L — ABNORMAL HIGH (ref 15–41)
Albumin: 2.4 g/dL — ABNORMAL LOW (ref 3.5–5.0)
Alkaline Phosphatase: 169 U/L — ABNORMAL HIGH (ref 38–126)
Anion gap: 9 (ref 5–15)
BILIRUBIN TOTAL: 6.9 mg/dL — AB (ref 0.3–1.2)
BUN: 33 mg/dL — ABNORMAL HIGH (ref 6–20)
CALCIUM: 11.1 mg/dL — AB (ref 8.9–10.3)
CO2: 18 mmol/L — ABNORMAL LOW (ref 22–32)
Chloride: 107 mmol/L (ref 101–111)
Creatinine, Ser: 3.58 mg/dL — ABNORMAL HIGH (ref 0.61–1.24)
GFR calc Af Amer: 20 mL/min — ABNORMAL LOW (ref >60)
GFR, EST NON AFRICAN AMERICAN: 17 mL/min — AB (ref >60)
GLUCOSE: 109 mg/dL — AB (ref 65–99)
POTASSIUM: 3.9 mmol/L (ref 3.5–5.1)
Sodium: 134 mmol/L — ABNORMAL LOW (ref 135–145)
TOTAL PROTEIN: 6.3 g/dL — AB (ref 6.5–8.1)

## 2016-04-22 LAB — RENAL FUNCTION PANEL
Albumin: 2.5 g/dL — ABNORMAL LOW (ref 3.5–5.0)
Anion gap: 7 (ref 5–15)
BUN: 35 mg/dL — ABNORMAL HIGH (ref 6–20)
CALCIUM: 10.8 mg/dL — AB (ref 8.9–10.3)
CHLORIDE: 109 mmol/L (ref 101–111)
CO2: 17 mmol/L — AB (ref 22–32)
Creatinine, Ser: 4.19 mg/dL — ABNORMAL HIGH (ref 0.61–1.24)
GFR calc Af Amer: 16 mL/min — ABNORMAL LOW (ref >60)
GFR calc non Af Amer: 14 mL/min — ABNORMAL LOW (ref >60)
Glucose, Bld: 106 mg/dL — ABNORMAL HIGH (ref 65–99)
Phosphorus: 2.7 mg/dL (ref 2.5–4.6)
Potassium: 3.8 mmol/L (ref 3.5–5.1)
SODIUM: 133 mmol/L — AB (ref 135–145)

## 2016-04-22 LAB — AMMONIA: Ammonia: 105 umol/L — ABNORMAL HIGH (ref 9–35)

## 2016-04-22 MED ORDER — ENOXAPARIN SODIUM 30 MG/0.3ML ~~LOC~~ SOLN
30.0000 mg | SUBCUTANEOUS | Status: DC
  Administered 2016-04-22 – 2016-04-24 (×3): 30 mg via SUBCUTANEOUS
  Filled 2016-04-22 (×3): qty 0.3

## 2016-04-22 NOTE — Progress Notes (Signed)
PROGRESS NOTE [facility]   Casey Reyes   ERD:[id number] DOB: [birth year]  DOA: 04/20/2016 PCP: Leeanne Rio, PA-C   Brief Narrative: 61 year old male with medical history significant for diabetes, hypertension, hepatocellular carcinoma stage IV currently on chemotherapy. Presented to the emergency department with generalized weakness and lethargy and confusion. Patient was found to have a calcium level of 14.1 and a workup at the cancer center. Was treated with Zometa. Patient admitted acute metabolic encephalopathy secondary to hypercalcemia, hepatic encephalopathy and UTI  Subjective: Patient seen and examined with wife at bedside. Wife report mild improvement and patient more coherent. Ammonia level up. Patient with good urine output reports no pain. No acute events overnight.  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 Need input on goals of treatment, this patient is full code, but prognosis does not seem to be good.   DM type 2 - CBG's stable  SSI  Continue to monitor   Physical deconditioning  PT/OT   DVT prophylaxis: Lovenox  Code Status: FULL  Family Communication: Wife at bedside Disposition Plan: Home  when medically stable   Consultants:   None    Procedures:   Antimicrobials:  Rocephin    Objective: Vitals:   04/21/16 1352 04/21/16 2041 04/22/16 0457 04/22/16 1351  BP: 100/70 103/69 101/72 107/77  Pulse: 91 95 96 87  Resp: 16 16 16 16   Temp: 97.8 F (36.6 C) 97.6 F (36.4 C) 98 F (36.7 C) 98 F (36.7 C)  TempSrc: Oral Oral Oral Oral  SpO2: 96% 98% 97% 98%  Weight:      Height:        Intake/Output Summary (Last 24 hours) at 04/22/16 1805 Last data filed at 04/22/16 1757  Gross per 24 hour  Intake             3565 ml  Output              500 ml  Net             3065 ml   Filed Weights   04/20/16 1941 04/21/16 0200  Weight: 102.1 kg (225 lb) 103 kg (227 lb 1.2 oz)    Examination:  General exam: NAD, jaundice  HEENT: Scleral icterus  Respiratory system: Clear anteriorly  Cardiovascular system: S1S2 RRR no murmurs  Gastrointestinal system: Abdomen distended, firm , positive fluid wave, no hepatomegaly noted  Central nervous system: Oriented to person, year not to place . Extremities: No pedal edema. Skin: No rashes, lesions or ulcers  Data Reviewed: I have personally reviewed following labs and imaging studies  CBC:  Recent Labs Lab 04/18/16 1318 04/20/16 1945  WBC 6.5 8.8  NEUTROABS 4.2  --  HGB 13.3 13.9  HCT 38.4 37.8*  MCV 82.4 77.1*  PLT 258 621   Basic Metabolic Panel:  Recent Labs Lab 04/18/16 1318 04/20/16 1945 04/21/16 0513 04/22/16 0504  NA 133* 132* 134* 134*  K 4.5 4.0 4.2 3.9  CL  --  101 105 107  CO2 21* 22 20* 18*  GLUCOSE 181* 134* 114* 109*  BUN 17.1 16 21* 33*  CREATININE 1.2 0.99 1.53* 3.58*  CALCIUM 14.1* 13.3* 12.2* 11.1*   GFR: Estimated Creatinine Clearance: 24.8 mL/min (A) (by C-G formula based on SCr of 3.58 mg/dL (H)). Liver Function Tests:  Recent Labs Lab 04/18/16 1318 04/20/16 1945 04/21/16 0513 04/22/16 0504  AST 656* 597* 562* 869*  ALT 50 49 42 50  ALKPHOS 273* 224* 176* 169*  BILITOT  6.48* 10.3* 9.1* 6.9*  PROT 7.3 7.5 6.6 6.3*  ALBUMIN 2.8* 2.8* 2.5* 2.4*    Recent Labs Lab 04/20/16 1945  LIPASE 32    Recent Labs Lab 04/20/16 2028 04/21/16 1150 04/22/16 0504  AMMONIA 54* 73* 105*   Coagulation Profile: No results for input(s): INR, PROTIME in the last 168 hours. Cardiac Enzymes: No results for input(s): CKTOTAL, CKMB, CKMBINDEX, TROPONINI in the last 168 hours. BNP (last 3 results) No results for input(s): PROBNP in the last 8760 hours. HbA1C: No results for input(s): HGBA1C in the last 72 hours. CBG:  Recent Labs Lab 04/22/16 0054 04/22/16 0448 04/22/16 0737 04/22/16 1150 04/22/16 1705  GLUCAP 108* 120* 126* 140* 101*   Lipid Profile: No results for input(s): CHOL, HDL, LDLCALC, TRIG, CHOLHDL, LDLDIRECT in the last 72 hours. Thyroid Function Tests: No results for input(s): TSH, T4TOTAL, FREET4, T3FREE, THYROIDAB in the last 72 hours. Anemia Panel: No results for input(s): VITAMINB12, FOLATE, FERRITIN, TIBC, IRON, RETICCTPCT in the last 72 hours. Sepsis Labs:  Recent Labs Lab 04/20/16 2002  LATICACIDVEN 1.34    Recent Results (from the past 240 hour(s))  Urine culture     Status: None   Collection Time: 04/20/16  8:21 PM  Result Value Ref Range Status   Specimen Description URINE, RANDOM  Final   Special Requests NONE  Final   Culture   Final    NO GROWTH Performed at [facility], [address], [place], [place] [address]    Report Status 04/22/2016 FINAL  Final      Radiology Studies: Dg Abdomen Acute W/chest  Result Date: 04/20/2016 CLINICAL DATA:  Acute abdominal pain, no fever, labral lesions EXAM: DG ABDOMEN ACUTE W/ 1V CHEST COMPARISON:  CT scan 03/29/2016 FINDINGS: Cardiomediastinal silhouette is unremarkable. No infiltrate or pleural effusion. Mild gaseous distended small bowel loops and distal abdomen probable mild ileus. No small bowel air-fluid levels. No evidence of free abdominal air. IMPRESSION: Mild  gaseous distended small bowel loops in distal abdomen probable mild ileus. No small bowel air-fluid levels. No evidence of free abdominal air. No acute disease within chest. Electronically Signed   By: Lahoma Crocker M.D.   On: 04/20/2016 21:33   Ct Renal Stone Study  Result Date: 04/20/2016 CLINICAL DATA:  Generalized abdominal pain for 2 weeks. EXAM: CT ABDOMEN AND PELVIS WITHOUT CONTRAST TECHNIQUE: Multidetector CT imaging of the abdomen and pelvis was performed following the standard protocol without IV contrast. COMPARISON:  CT scan of March 29, 2016. FINDINGS: Lower chest: No acute abnormality. Hepatobiliary: No gallstones are noted. The liver is severely enlarged with nodular contours consistent with hepatic cirrhosis. It is heterogeneous in appearance on these unenhanced  images, concerning for underlying hepatocellular carcinoma. Pancreas: Unremarkable. No pancreatic ductal dilatation or surrounding inflammatory changes. Spleen: Normal in size without focal abnormality. Adrenals/Urinary Tract: Adrenal glands are unremarkable. Kidneys are normal, without renal calculi, focal lesion, or hydronephrosis. Bladder is unremarkable. Stomach/Bowel: Stomach is within normal limits. Appendix appears normal. No evidence of bowel wall thickening, distention, or inflammatory changes. Vascular/Lymphatic: Aortic atherosclerosis. No enlarged abdominal or pelvic lymph nodes. Reproductive: Mild prostatic enlargement is noted. Other: Mild ascites is noted.  No hernia is noted. Musculoskeletal: No acute or significant osseous findings. IMPRESSION: Hepatomegaly and hepatic cirrhosis is noted. Hepatic parenchyma is heterogeneous in appearance consistent with underlying masses, concerning for hepatocellular carcinoma. This is noted on prior exams. Aortic atherosclerosis. Mild ascites. Electronically Signed   By: Marijo Conception, M.D.   On: 04/20/2016 22:23    Scheduled Meds: . calcitonin  400 Units Intramuscular BID  .  cefTRIAXone (ROCEPHIN)  IV  1 g Intravenous Q24H  . chlorhexidine  15 mL Mouth Rinse BID  . enoxaparin (LOVENOX) injection  30 mg Subcutaneous Q24H  . feeding supplement  1 Container Oral TID BM  . feeding supplement (ENSURE ENLIVE)  237 mL Oral BID BM  . insulin aspart  0-9 Units Subcutaneous Q4H  . lactulose  30 g Oral TID  . mouth rinse  15 mL Mouth Rinse q12n4p  . pantoprazole  40 mg Oral Daily  . pregabalin  100 mg Oral BID  . tamsulosin  0.8 mg Oral Daily   Continuous Infusions: . sodium chloride 75 mL/hr at 04/22/16 1040     LOS: 1 day    Chipper Oman, MD Pager: Text Page via [email or web address]  [phone]  If 7PM-7AM, please contact night-coverage [email or web address] Password [facility] 04/22/2016, 6:05 PM

## 2016-04-22 NOTE — Telephone Encounter (Signed)
Spoke with Peter Congo to address concerns and to help her make sense of what is going on during patient's hospitalization.

## 2016-04-22 NOTE — Progress Notes (Signed)
Patient with minimal urine output all shift. Abdomen is distended and firm. Bladder scan showed 280 cc. Encouraged patient to drink more fluids. Will pass information to on coming nurse.

## 2016-04-22 NOTE — Care Management Note (Signed)
Case Management Note  Patient Details  Name: Casey Reyes MRN: [id number] Date of Birth: Jun 11, 1955  Subjective/Objective:  PT recc SNF-CSW following.                  Action/Plan:d/c plan SNF.   Expected Discharge Date:                  Expected Discharge Plan:  Skilled Nursing Facility  In-House Referral:  Clinical Social Work  Discharge planning Services  CM Consult  Post Acute Care Choice:    Choice offered to:     DME Arranged:    DME Agency:     HH Arranged:    [place] Agency:     Status of Service:  In process, will continue to follow  If discussed at Long Length of Stay Meetings, dates discussed:    Additional Comments:  Dessa Phi, RN 04/22/2016, 3:26 PM

## 2016-04-22 NOTE — Evaluation (Signed)
Physical Therapy Evaluation Patient Details Name: Casey Reyes MRN: [id number] DOB: [birth year] Today's Date: 04/22/2016   History of Present Illness   Casey Reyes is a 61 y.o. male adm with AMS/increasing lethargy medical history significant of DM, HTN, HCC stage 4 currently on chemotherapy  Clinical Impression  Pt admitted with above diagnosis. Pt currently with functional limitations due to the deficits listed below (see PT Problem List).  Pt will benefit from skilled PT to increase their independence and safety with mobility to allow discharge to the venue listed below. Pt requiring mod assist for bed mobility, transfers, will likely need SNF unless mental status clears significantly       Follow Up Recommendations SNF    Equipment Recommendations  None recommended by PT (defer to SNF)    Recommendations for Other Services       Precautions / Restrictions Precautions Precautions: Fall      Mobility  Bed Mobility Overal bed mobility: Needs Assistance Bed Mobility: Supine to Sit     Supine to sit: Mod assist;HOB elevated     General bed mobility comments: assist with bil LEs off bed, hand over hand for pt to self assist, assist with trunk to upright  Transfers Overall transfer level: Needs assistance Equipment used: Rolling walker (2 wheeled) Transfers: Sit to/from [facility] Sit to Stand: Mod assist;Max assist Stand pivot transfers: Mod assist       General transfer comment: multi-modal cues for hand placement, wt shift to stand; sit<>stand xfer repeated x2 for instruction and activity tolerance  Ambulation/Gait                Stairs            Wheelchair Mobility    Modified Rankin (Stroke Patients Only)       Balance Overall balance assessment: Needs assistance Sitting-balance support: No upper extremity supported;Feet supported Sitting balance-Leahy Scale: Poor Sitting balance - Comments: repeated posterior LOB requiring  assist to correct Postural control: Posterior lean   Standing balance-Leahy Scale: Poor Standing balance comment: requires external assist of PT and multi-modal cues to maintain standing                              Pertinent Vitals/Pain Pain Assessment: No/denies pain    Home Living Family/patient expects to be discharged to:: Private residence Living Arrangements: Spouse/significant other Available Help at Discharge: Family;Available 24 hours/day Type of Home: Apartment Home Access: Level entry     Home Layout: One level Home Equipment: Walker - 2 wheels;Bedside commode (DMW from wife's surgeries) Additional Comments: hx taken from pt wife, pt unable to provide info d/t AMS    Prior Function Level of Independence: Independent               Hand Dominance        Extremity/Trunk Assessment   Upper Extremity Assessment Upper Extremity Assessment: Generalized weakness    Lower Extremity Assessment Lower Extremity Assessment: Generalized weakness       Communication   Communication: Other (comment) (lethargic)  Cognition Arousal/Alertness: Lethargic Behavior During Therapy: Flat affect Overall Cognitive Status: Impaired/Different from baseline Area of Impairment: Following commands;Problem solving                       Following Commands: Follows one step commands with increased time;Follows multi-step commands inconsistently     Problem Solving: Slow processing;Decreased initiation;Difficulty sequencing;Requires verbal cues;Requires tactile cues  General Comments: step by step cues for sequencing all tasks; pt is able to state he is in the hospita --requires incr time and cues to respond      General Comments      Exercises General Exercises - Lower Extremity Ankle Circles/Pumps: AROM;10 reps   Assessment/Plan    PT Assessment Patient needs continued PT services  PT Problem List Decreased strength;Decreased activity  tolerance;Decreased balance;Decreased mobility;Decreased cognition;Decreased knowledge of use of DME;Decreased safety awareness       PT Treatment Interventions DME instruction;Gait training;Functional mobility training;Therapeutic activities;Therapeutic exercise;Patient/family education    PT Goals (Current goals can be found in the Care Plan section)  Acute Rehab PT Goals Patient Stated Goal: unable to state PT Goal Formulation: With family Time For Goal Achievement: 05-04-2016 Potential to Achieve Goals: Good    Frequency Min 3X/week   Barriers to discharge        Co-evaluation               End of Session Equipment Utilized During Treatment: Gait belt Activity Tolerance: Patient tolerated treatment well Patient left: with call bell/phone within reach;with family/visitor present;in chair;with chair alarm set Nurse Communication: Mobility status PT Visit Diagnosis: Muscle weakness (generalized) (M62.81);Other abnormalities of gait and mobility (R26.89)    Time: 2952-8413 PT Time Calculation (min) (ACUTE ONLY): 32 min   Charges:   PT Evaluation $PT Eval Moderate Complexity: 1 Procedure PT Treatments $Therapeutic Activity: 8-22 mins   PT G Codes:          Casey Reyes 04-27-16, 11:51 AM

## 2016-04-22 NOTE — Telephone Encounter (Signed)
Wife Casey Reyes called stating that pt is at [facility] room  1421 with severe abdominal pain, and high calcium level.  Casey Reyes was concerned about pt missing chemo appt on Thurs 3/29.  Dr. Burr Medico notified. Spoke with Casey Reyes, and informed her not to be concerned about pt missing chemo appt.  Dr. Burr Medico will see pt in the am 04/23/16.  Casey Reyes voiced understanding. Gloria's    Phone      [phone].

## 2016-04-22 NOTE — Progress Notes (Signed)
Pt had a large dark red stool. MD text paged with findings. Will continue to monitor.

## 2016-04-23 DIAGNOSIS — C220 Liver cell carcinoma: Secondary | ICD-10-CM

## 2016-04-23 DIAGNOSIS — G92 Toxic encephalopathy: Secondary | ICD-10-CM

## 2016-04-23 LAB — COMPREHENSIVE METABOLIC PANEL
ALBUMIN: 2.3 g/dL — AB (ref 3.5–5.0)
ALK PHOS: 164 U/L — AB (ref 38–126)
ALT: 51 U/L (ref 17–63)
AST: 836 U/L — AB (ref 15–41)
Anion gap: 10 (ref 5–15)
BILIRUBIN TOTAL: 6.5 mg/dL — AB (ref 0.3–1.2)
BUN: 36 mg/dL — AB (ref 6–20)
CHLORIDE: 110 mmol/L (ref 101–111)
CO2: 17 mmol/L — ABNORMAL LOW (ref 22–32)
Calcium: 10.9 mg/dL — ABNORMAL HIGH (ref 8.9–10.3)
Creatinine, Ser: 4.05 mg/dL — ABNORMAL HIGH (ref 0.61–1.24)
GFR calc Af Amer: 17 mL/min — ABNORMAL LOW (ref >60)
GFR, EST NON AFRICAN AMERICAN: 15 mL/min — AB (ref >60)
GLUCOSE: 82 mg/dL (ref 65–99)
Potassium: 3.8 mmol/L (ref 3.5–5.1)
Sodium: 137 mmol/L (ref 135–145)
Total Protein: 6.3 g/dL — ABNORMAL LOW (ref 6.5–8.1)

## 2016-04-23 LAB — CBC WITH DIFFERENTIAL/PLATELET
Basophils Absolute: 0.1 10*3/uL (ref 0.0–0.1)
Basophils Relative: 2 %
EOS PCT: 3 %
Eosinophils Absolute: 0.2 10*3/uL (ref 0.0–0.7)
HCT: 32.8 % — ABNORMAL LOW (ref 39.0–52.0)
Hemoglobin: 11.6 g/dL — ABNORMAL LOW (ref 13.0–17.0)
LYMPHS ABS: 1.4 10*3/uL (ref 0.7–4.0)
LYMPHS PCT: 22 %
MCH: 28.4 pg (ref 26.0–34.0)
MCHC: 35.4 g/dL (ref 30.0–36.0)
MCV: 80.4 fL (ref 78.0–100.0)
Monocytes Absolute: 0.7 10*3/uL (ref 0.1–1.0)
Monocytes Relative: 12 %
Neutro Abs: 3.9 10*3/uL (ref 1.7–7.7)
Neutrophils Relative %: 62 %
PLATELETS: 275 10*3/uL (ref 150–400)
RBC: 4.08 MIL/uL — AB (ref 4.22–5.81)
RDW: 20.5 % — ABNORMAL HIGH (ref 11.5–15.5)
WBC: 6.4 10*3/uL (ref 4.0–10.5)

## 2016-04-23 LAB — GLUCOSE, CAPILLARY
Glucose-Capillary: 118 mg/dL — ABNORMAL HIGH (ref 65–99)
Glucose-Capillary: 86 mg/dL (ref 65–99)
Glucose-Capillary: 170 mg/dL — ABNORMAL HIGH (ref 65–99)
Glucose-Capillary: 114 mg/dL — ABNORMAL HIGH (ref 65–99)

## 2016-04-23 NOTE — Telephone Encounter (Signed)
Pt wife LMOVM asking for a call back from [place] at 4:57 today.

## 2016-04-23 NOTE — Progress Notes (Signed)
CSW attempted to assess patient, patient having difficulty speaking. Patient granted CSW verbal permission to contact his wife. CSW attempted to contact patient's wife Windy Canny [phone]), no answer. CSW left voicemail, awaiting return phone call.   Abundio Miu, [place] Social Worker [facility] Cell#: [phone]

## 2016-04-23 NOTE — Progress Notes (Signed)
CSW spoke with patient's wife Casey Reyes, who reported that patient has a prognosis of one month and that residential hospice is being considered. Patient's wife reports that other family members are expected to arrive tomorrow afternoon and assist with decision making. Patient's wife requested that CSW follow up tomorrow for an update on plans for patient's disposition.   Abundio Miu, [place] Social Worker [facility] Cell#: [phone]

## 2016-04-23 NOTE — Progress Notes (Signed)
PROGRESS NOTE [facility]  Casey Reyes   WUJ:[id number] DOB: [birth year]  DOA: 04/20/2016 PCP: Leeanne Rio, PA-C   Brief Narrative:  18 ? dm ty II,  hypertension, hepatocellular carcinoma stage IV currently on chemotherapy. Untreated hepatitis   Presented to the emergency department with generalized weakness and lethargy and confusion on 3/26   Patient was found to have a calcium level of 14.1 and a workup at the cancer center.  Was treated with Zometa.  Patient admitted acute metabolic encephalopathy secondary to hypercalcemia, hepatic encephalopathy and UTI  Was previously admitted to the hospital 3/1-3/5 for malignant hypercalcemia which was treated at that time with Zomig  Subjective:     Assessment & Plan:  Acute metabolic encephalopathy   multifactorial hepatic encephalopathy, UTI and hypercalcemia Ammonia level up 105 - Increase Lactulose to 30 mg TID Patient with mild ascites, may benefit from paracentesis, but pt with AKI, high risk for hepatorenal sx  Continue IVF 75 cc/h  Malignant hypercalcemia - corrected Ca 12.0 Continue IVF  Continue calcitonin  On 3/26 was treated with IV lasix likely the cause of AKI   UTI  Continue Rocephin x 3 days  Urine cx no growth   AKI - likely in setting of diarrhea and lasix -BUN/creatinine currently rising 36/4.05 Baseline creatinine 04/20/16 16/0.99 Continue IVF  Stop Lasix  Might need Foley catheter placed  CT 3/25 renal stone protocol did not show any suggestion of obstructive uropathy Will be consulting palliaitive care  [place] stage IV/Liver Cirrhosis diagnosed 03/14/2016  Need input on goals of treatment, this patient is full code, but prognosis does not seem to be good.  the intent of care is palliative and with Dr. Ernestina Penna input  we will involve palliative care  DM type 2 - CBG's stable  SSI is 80- 140 Continue to monitor   Physical deconditioning  PT/OT    DVT prophylaxis: Lovenox  Code  Status: FULL  Family Communication: Wife at bedside--long discussion about multi-organ illnesses and decline and did mention irreversibility of the same Disposition Plan: Home when medically stable    Consultants:   Oncology-Dr. Burr Medico  Palliative care   Procedures:   Antimicrobials:  Rocephin    Objective: Vitals:   04/22/16 0457 04/22/16 1351 04/22/16 2053 04/23/16 0604  BP: 101/72 107/77 126/82 119/74  Pulse: 96 87 92 100  Resp: 16 16 19 18   Temp: 98 F (95.6 C) 98 F (36.7 C) 98.6 F (37 C) 97.8 F (36.6 C)  TempSrc: Oral Oral Axillary Oral  SpO2: 97% 98% 97% 97%  Weight:      Height:        Intake/Output Summary (Last 24 hours) at 04/23/16 0826 Last data filed at 04/23/16 2130  Gross per 24 hour  Intake          2938.75 ml  Output              875 ml  Net          2063.75 ml   Filed Weights   04/20/16 1941 04/21/16 0200  Weight: 102.1 kg (225 lb) 103 kg (227 lb 1.2 oz)    Examination:  General exam: NAD, jaundice, sleepy and some what confused HEENT: Scleral icterus  Respiratory system: Clear anteriorly  Cardiovascular system: S1S2 RRR no murmurs  Gastrointestinal system: Abdomen distended, firm , positive fluid wave, no hepatomegaly noted  Central nervous system: Oriented to person, year not to place . Extremities: No pedal edema. Skin: No rashes, lesions  or ulcers  Data Reviewed: I have personally reviewed following labs and imaging studies  CBC:  Recent Labs Lab 04/18/16 1318 04/20/16 1945 04/23/16 0446  WBC 6.5 8.8 6.4  NEUTROABS 4.2  --  3.9  HGB 13.3 13.9 11.6*  HCT 38.4 37.8* 32.8*  MCV 82.4 77.1* 80.4  PLT 258 265 169   Basic Metabolic Panel:  Recent Labs Lab 04/20/16 1945 04/21/16 0513 04/22/16 0504 04/22/16 1844 04/23/16 0446  NA 132* 134* 134* 133* 137  K 4.0 4.2 3.9 3.8 3.8  CL 101 105 107 109 110  CO2 22 20* 18* 17* 17*  GLUCOSE 134* 114* 109* 106* 82  BUN 16 21* 33* 35* 36*  CREATININE 0.99 1.53* 3.58* 4.19*  4.05*  CALCIUM 13.3* 12.2* 11.1* 10.8* 10.9*  PHOS  --   --   --  2.7  --    GFR: Estimated Creatinine Clearance: 21.9 mL/min (A) (by C-G formula based on SCr of 4.05 mg/dL (H)). Liver Function Tests:  Recent Labs Lab 04/18/16 1318 04/20/16 1945 04/21/16 0513 04/22/16 0504 04/22/16 1844 04/23/16 0446  AST 656* 597* 562* 869*  --  836*  ALT 50 49 42 50  --  51  ALKPHOS 273* 224* 176* 169*  --  164*  BILITOT 6.48* 10.3* 9.1* 6.9*  --  6.5*  PROT 7.3 7.5 6.6 6.3*  --  6.3*  ALBUMIN 2.8* 2.8* 2.5* 2.4* 2.5* 2.3*    Recent Labs Lab 04/20/16 1945  LIPASE 32    Recent Labs Lab 04/20/16 2028 04/21/16 1150 04/22/16 0504  AMMONIA 54* 73* 105*   Coagulation Profile: No results for input(s): INR, PROTIME in the last 168 hours. Cardiac Enzymes: No results for input(s): CKTOTAL, CKMB, CKMBINDEX, TROPONINI in the last 168 hours. BNP (last 3 results) No results for input(s): PROBNP in the last 8760 hours. HbA1C: No results for input(s): HGBA1C in the last 72 hours. CBG:  Recent Labs Lab 04/22/16 1705 04/22/16 1929 04/22/16 2359 04/23/16 0355 04/23/16 0750  GLUCAP 101* 93 144* 118* 86   Lipid Profile: No results for input(s): CHOL, HDL, LDLCALC, TRIG, CHOLHDL, LDLDIRECT in the last 72 hours. Thyroid Function Tests: No results for input(s): TSH, T4TOTAL, FREET4, T3FREE, THYROIDAB in the last 72 hours. Anemia Panel: No results for input(s): VITAMINB12, FOLATE, FERRITIN, TIBC, IRON, RETICCTPCT in the last 72 hours. Sepsis Labs:  Recent Labs Lab 04/20/16 2002  LATICACIDVEN 1.34    Recent Results (from the past 240 hour(s))  Urine culture     Status: None   Collection Time: 04/20/16  8:21 PM  Result Value Ref Range Status   Specimen Description URINE, RANDOM  Final   Special Requests NONE  Final   Culture   Final    NO GROWTH Performed at [facility], [address] [place], [place] [address]    Report Status 04/22/2016 FINAL  Final      Radiology  Studies: No results found.  Scheduled Meds: . calcitonin  400 Units Intramuscular BID  . cefTRIAXone (ROCEPHIN)  IV  1 g Intravenous Q24H  . chlorhexidine  15 mL Mouth Rinse BID  . enoxaparin (LOVENOX) injection  30 mg Subcutaneous Q24H  . feeding supplement  1 Container Oral TID BM  . feeding supplement (ENSURE ENLIVE)  237 mL Oral BID BM  . insulin aspart  0-9 Units Subcutaneous Q4H  . lactulose  30 g Oral TID  . mouth rinse  15 mL Mouth Rinse q12n4p  . pantoprazole  40  mg Oral Daily  . pregabalin  100 mg Oral BID  . tamsulosin  0.8 mg Oral Daily   Continuous Infusions: . sodium chloride 75 mL/hr at 04/23/16 3016     LOS: 2 days    Verneita Griffes, MD [facility] (P) [phone]   If 7PM-7AM, please contact night-coverage [email or web address] Password [facility] 04/23/2016, 8:26 AM

## 2016-04-23 NOTE — Progress Notes (Signed)
CBG 181 this evening.

## 2016-04-23 NOTE — Progress Notes (Signed)
Casey Reyes   DOB:09-Mar-1955   ZG#:[id number]   PRF#:[id number]  Oncology follow-up note  Subjective: Patient is well-known to me, under my care for his recent diagnosed Casey Reyes. His condition has overall client quickly in the past few weeks. He was admitted for encephalopathy and hypercalcemia yesterday. He is accompanied by his partner today. He is somnolent, arousal, but disoriented,  denies abdominal pain.    Objective:  Vitals:   04/23/16 0604 04/23/16 1325  BP: 119/74 112/68  Pulse: 100 95  Resp: 18 19  Temp: 97.8 F (36.6 C) 98.6 F (37 C)    Body mass index is 35.56 kg/m.  Intake/Output Summary (Last 24 hours) at 04/23/16 1720 Last data filed at 04/23/16 1325  Gross per 24 hour  Intake          2573.75 ml  Output             1400 ml  Net          1173.75 ml     Sclerae jaundice   Oropharynx clear  No peripheral adenopathy  Lungs clear -- no rales or rhonchi  Heart regular rate and rhythm  Abdomen Distended, nontender, (+) ascites   MSK no focal spinal tenderness, no peripheral edema  Neuro nonfocal  CBG (last 3)   Recent Labs  04/23/16 0750 04/23/16 1145 04/23/16 1654  GLUCAP 86 170* 114*     Labs:  Lab Results  Component Value Date   WBC 6.4 04/23/2016   HGB 11.6 (L) 04/23/2016   HCT 32.8 (L) 04/23/2016   MCV 80.4 04/23/2016   PLT 275 04/23/2016   NEUTROABS 3.9 04/23/2016   CMP Latest Ref Rng & Units 04/23/2016 04/22/2016 04/22/2016  Glucose 65 - 99 mg/dL 82 106(H) 109(H)  BUN 6 - 20 mg/dL 36(H) 35(H) 33(H)  Creatinine 0.61 - 1.24 mg/dL 4.05(H) 4.19(H) 3.58(H)  Sodium 135 - 145 mmol/L 137 133(L) 134(L)  Potassium 3.5 - 5.1 mmol/L 3.8 3.8 3.9  Chloride 101 - 111 mmol/L 110 109 107  CO2 22 - 32 mmol/L 17(L) 17(L) 18(L)  Calcium 8.9 - 10.3 mg/dL 10.9(H) 10.8(H) 11.1(H)  Total Protein 6.5 - 8.1 g/dL 6.3(L) - 6.3(L)  Total Bilirubin 0.3 - 1.2 mg/dL 6.5(H) - 6.9(H)  Alkaline Phos 38 - 126 U/L 164(H) - 169(H)  AST 15 - 41 U/L 836(H) - 869(H)  ALT 17 - 63  U/L 51 - 50     Urine Studies No results for input(s): UHGB, CRYS in the last 72 hours.  Invalid input(s): UACOL, UAPR, USPG, UPH, UTP, UGL, UKET, UBIL, UNIT, UROB, [place], UEPI, UWBC, Casey Reyes, [place]  Basic Metabolic Panel:  Recent Labs Lab 04/20/16 1945 04/21/16 0513 04/22/16 0504 04/22/16 1844 04/23/16 0446  NA 132* 134* 134* 133* 137  K 4.0 4.2 3.9 3.8 3.8  CL 101 105 107 109 110  CO2 22 20* 18* 17* 17*  GLUCOSE 134* 114* 109* 106* 82  BUN 16 21* 33* 35* 36*  CREATININE 0.99 1.53* 3.58* 4.19* 4.05*  CALCIUM 13.3* 12.2* 11.1* 10.8* 10.9*  PHOS  --   --   --  2.7  --    GFR Estimated Creatinine Clearance: 21.9 mL/min (A) (by C-G formula based on SCr of 4.05 mg/dL (H)). Liver Function Tests:  Recent Labs Lab 04/18/16 1318 04/20/16 1945 04/21/16 0513 04/22/16 0504 04/22/16 1844 04/23/16 0446  AST 656* 597* 562* 869*  --  836*  ALT 50 49 42 50  --  51  ALKPHOS 273* 224* 176* 169*  --  164*  BILITOT 6.48* 10.3* 9.1* 6.9*  --  6.5*  PROT 7.3 7.5 6.6 6.3*  --  6.3*  ALBUMIN 2.8* 2.8* 2.5* 2.4* 2.5* 2.3*    Recent Labs Lab 04/20/16 1945  LIPASE 32    Recent Labs Lab 04/20/16 2028 04/21/16 1150 04/22/16 0504  AMMONIA 54* 73* 105*   Coagulation profile No results for input(s): INR, PROTIME in the last 168 hours.  CBC:  Recent Labs Lab 04/18/16 1318 04/20/16 1945 04/23/16 0446  WBC 6.5 8.8 6.4  NEUTROABS 4.2  --  3.9  HGB 13.3 13.9 11.6*  HCT 38.4 37.8* 32.8*  MCV 82.4 77.1* 80.4  PLT 258 265 275   Cardiac Enzymes: No results for input(s): CKTOTAL, CKMB, CKMBINDEX, TROPONINI in the last 168 hours. BNP: Invalid input(s): POCBNP CBG:  Recent Labs Lab 04/22/16 2359 04/23/16 0355 04/23/16 0750 04/23/16 1145 04/23/16 1654  GLUCAP 144* 118* 86 170* 114*   D-Dimer No results for input(s): DDIMER in the last 72 hours. Hgb A1c No results for input(s): HGBA1C in the last 72 hours. Lipid Profile No results for input(s): CHOL,  HDL, LDLCALC, TRIG, CHOLHDL, LDLDIRECT in the last 72 hours. Thyroid function studies No results for input(s): TSH, T4TOTAL, T3FREE, THYROIDAB in the last 72 hours.  Invalid input(s): FREET3 Anemia work up No results for input(s): VITAMINB12, FOLATE, FERRITIN, TIBC, IRON, RETICCTPCT in the last 72 hours. Microbiology Recent Results (from the past 240 hour(s))  Urine culture     Status: None   Collection Time: 04/20/16  8:21 PM  Result Value Ref Range Status   Specimen Description URINE, RANDOM  Final   Special Requests NONE  Final   Culture   Final    NO GROWTH Performed at [facility], [address] [place], [place] [address]    Report Status 04/22/2016 FINAL  Final      Studies:  No results found.  Assessment: 61 y.o. African American male With recently diagnosed Gordon, admitted for acute encephalopathy and hypercalcemia  1. Recurrent hypo-and calcium media, secondary to underlying liver cancer 2. Hepatocellular carcinoma, received one dose Nivolumab  3. AKI 4. Metabolic encephalopathy 5. Liver cirrhosis 6. DM type 2  Plan:  -His liver cancer has progressed rapidly through the treatment, due to his worsening liver function, multiorgan failure, exetremly poor performance status, We will discontinue chemotherapy at this time.  -I recommend hospice and comfort care. He is appropriate for hospice. The patient may need inpatient hospice. His wife is agreeable.  -His mother and sisters are coming tomorrow, likely we'll transition to hospice afterwards -please consult palliative care  -I will follow up as needed   This document serves as a record of services personally performed by Casey Merle, MD. It was created on her behalf by Arlyce Harman, a trained medical scribe. The creation of this record is based on the scribe's personal observations and the provider's statements to them. This document has been checked and approved by the attending provider.  Casey Merle,  MD 04/23/2016  5:20 PM

## 2016-04-24 ENCOUNTER — Other Ambulatory Visit: Payer: 59

## 2016-04-24 ENCOUNTER — Ambulatory Visit: Payer: 59 | Admitting: Physician Assistant

## 2016-04-24 ENCOUNTER — Ambulatory Visit: Payer: 59

## 2016-04-24 ENCOUNTER — Encounter: Payer: 59 | Admitting: Nutrition

## 2016-04-24 ENCOUNTER — Ambulatory Visit: Payer: 59 | Admitting: Hematology

## 2016-04-24 DIAGNOSIS — Z7189 Other specified counseling: Secondary | ICD-10-CM

## 2016-04-24 DIAGNOSIS — Z515 Encounter for palliative care: Secondary | ICD-10-CM

## 2016-04-24 LAB — GLUCOSE, CAPILLARY
GLUCOSE-CAPILLARY: 181 mg/dL — AB (ref 65–99)
GLUCOSE-CAPILLARY: 177 mg/dL — AB (ref 65–99)
Glucose-Capillary: 124 mg/dL — ABNORMAL HIGH (ref 65–99)
Glucose-Capillary: 130 mg/dL — ABNORMAL HIGH (ref 65–99)
Glucose-Capillary: 135 mg/dL — ABNORMAL HIGH (ref 65–99)
Glucose-Capillary: 146 mg/dL — ABNORMAL HIGH (ref 65–99)

## 2016-04-24 NOTE — Care Management Note (Signed)
Case Management Note  Patient Details  Name: Casey Reyes MRN: [id number] Date of Birth: 12/28/1955  Subjective/Objective:  Noted-Palliative-Residential hospice facility-CSW already following.                  Action/Plan:d/c [facility].   Expected Discharge Date:                  Expected Discharge Plan:  [place]  In-House Referral:  Clinical Social Work  Discharge planning Services  CM Consult  Post Acute Care Choice:    Choice offered to:     DME Arranged:    DME Agency:     HH Arranged:    [place] Agency:     Status of Service:  In process, will continue to follow  If discussed at Long Length of Stay Meetings, dates discussed:    Additional Comments:  Dessa Phi, RN 04/24/2016, 1:20 PM

## 2016-04-24 NOTE — Telephone Encounter (Signed)
Spoke with patient's fiance Peter Congo regarding Mr. Casey Reyes. Hospital is pushing for Hospice. Discussed palliative care versus Hospice care. She is awaiting a consult in the hospital.

## 2016-04-24 NOTE — Progress Notes (Signed)
qPhysical Therapy Treatment Patient Details Name: Casey Reyes MRN: [id number] DOB: 1955/06/28 Today's Date: 04/24/2016    History of Present Illness  Casey Reyes is a 61 y.o. male adm with AMS/increasing lethargy medical history significant of DM, HTN, HCC stage 4 currently on chemotherapy    PT Comments    Pt found incont of urine in bed.  Assisted with side to side rolling for hygiene and complete bed change.  Then assisted with sitting to EOB which required + 2 Max/Total Assist.  Pt required assist to maintain static sitting and assist to properly place feet on floor.  Attempted to stand twice required B Platform EVA walker to transfer pt from bed to recliner.  Pt only able to briefly stand long enout to complete partial 1/4 pivot.  Pt unable to functionally weight shift enough to advance gait.  HOYER pad placed in recliner for nursing staff to use to assist pt back to bed. Very weak.  Required hand ove hand assist to hold his drink.   Follow Up Recommendations  SNF     Equipment Recommendations  None recommended by PT    Recommendations for Other Services       Precautions / Restrictions Precautions Precautions: Fall Precaution Comments: weak Restrictions Weight Bearing Restrictions: No    Mobility  Bed Mobility Overal bed mobility: Needs Assistance Bed Mobility: Supine to Sit     Supine to sit: +2 for physical assistance;+2 for safety/equipment;Max assist;Total assist     General bed mobility comments: pt found incont pool of urine with condom cath partially off.  Assisted with hygiene side to side rolling.   Transfers Overall transfer level: Needs assistance Equipment used:  (EVA walker) Transfers: Sit to/from Stand Sit to Stand: Max assist;+2 physical assistance;+2 safety/equipment Stand pivot transfers: Max assist;+2 physical assistance;+2 safety/equipment       General transfer comment: used B platform walker EVA walker for increased support.  Very weak.   Limited standing tolerance.  Difficulty weight shifting enough to attempt gait.  Assisted from elevated bed to recliner.    Ambulation/Gait             General Gait Details: unable to advance either LE or weight shift enough to attempt amb   Stairs            Wheelchair Mobility    Modified Rankin (Stroke Patients Only)       Balance                                            Cognition Arousal/Alertness: Awake/alert (slow and a little groggy) Behavior During Therapy: WFL for tasks assessed/performed Overall Cognitive Status: Impaired/Different from baseline                                 General Comments: AxO x 2 and pleasant.  Slow to respond but following all commands.        Exercises      General Comments        Pertinent Vitals/Pain Pain Assessment: No/denies pain    Home Living                      Prior Function            PT Goals (current goals can now be found  in the care plan section) Progress towards PT goals: Progressing toward goals    Frequency    Min 3X/week      PT Plan Current plan remains appropriate    Co-evaluation             End of Session Equipment Utilized During Treatment: Gait belt Activity Tolerance: Patient limited by fatigue Patient left: in chair;with family/visitor present Nurse Communication: Need for lift equipment PT Visit Diagnosis: Muscle weakness (generalized) (M62.81);Other abnormalities of gait and mobility (R26.89)     Time: 3559-7416 PT Time Calculation (min) (ACUTE ONLY): 29 min  Charges:  $Therapeutic Activity: 23-37 mins                    G Codes:       {Lisandra Mathisen  PTA [facility]  Acute  Rehab Pager      [phone]

## 2016-04-24 NOTE — Progress Notes (Signed)
Midnight CBG 124.

## 2016-04-24 NOTE — Progress Notes (Addendum)
CSW met with patient spouse Casey Reyes, explain role and reason for CSW intervention-assist with residential hospice placement. CSW provided education about [employer] [facility] and [facility] residential [facility] home. Patient wife express she wants to view facilities before making a decision. Patient became tearful but able to explain she was at peace with her husband prognosis. She reports feeling supported by family. CSW provided emotional support.  CSW will continue to assist with patient discharge to residential hospice. Plan: follow up with patient spouse in the am to discuss her choice.   Kathrin Greathouse, Latanya Presser, MSW Clinical Social Worker 5E and Psychiatric Service Line [phone] 04/24/2016  4:36 PM

## 2016-04-24 NOTE — Consult Note (Signed)
Consultation Note Date: 04/24/2016   Patient Name: Casey Reyes  DOB: [birth year]  MRN: [id number]  Age / Sex: 61 y.o., male  PCP: Brunetta Jeans, PA-C Referring Physician: Nita Sells, MD  Reason for Consultation: Establishing goals of care, Hospice Evaluation, Inpatient hospice referral and Psychosocial/spiritual support  HPI/Patient Profile: 61 y.o. male  with past medical history of hypertention, diabetes, hepatitis C, and recent diagnosis of hepatocellular carcinoma admitted on 04/20/2016 with encephalopathy, hypercalcemia, UTI, liver and renal failure.   Clinical Assessment and Goals of Care: I met today with Casey Reyes and his wife, Peter Congo.  He reports the most important thing to him is his family. He has been married to his wife the last 2 years.   We discussed Casey Reyes's clinical course over the past few months, including his hepatocellular carcinoma, encephalopathy, recurrent hypercalcemia and renal failure.  Discussed his rapid decline in nutrition, functional status, and cognition.  He reports that Dr. Burr Medico has told him that he is not a candidate for further disease modifying therapy and his main recommendation for residential hospice. We discussed that this does not mean that there is not care to give, however, it means we need to focus his care on him feeling as well as possible in order to spend meaningful time with his family. Concepts specific to code status, hospice, and hospitalization discussed.  We discussed difference between a aggressive medical intervention path and a palliative, comfort focused care path.    Questions and concerns addressed.   PMT will continue to support holistically.  He has additional family arriving tomorrow and I have offered to meet and discuss with them as well.  SUMMARY OF RECOMMENDATIONS   - We discussed that in light of multiple medical problems his care  should be focused on interventions that are likely to allow the patient to achieve goal of being out of the hospital and spending time with family. I discussed with him regarding heroic interventions at the end-of-life and he agree this would not be in line with wishes for a natural death or be likely to lead to getting well enough ever leave the hospital. CODE STATUS changed to DO NOT RESUSCITATE.  Durable DNR completed and on chart. - He is interested in residential hospice placement for end-of-life care. Dr. Burr Medico had discuss with his wife present regarding inpatient hospice. I reviewed with them regarding hospice philosophy and options for hospice care. His wife wants to tour both hospice home in [facility] in [place] prior to making a decision about pursuing residential hospice placement. I have asked social work to present options for hospice and facilitate her visiting as we are able.  Code Status/Advance Care Planning:  DNR   Palliative Prophylaxis:   Bowel Regimen and Frequent Pain Assessment  Psycho-social/Spiritual:   Desire for further Chaplaincy support: Did not address today  Additional Recommendations: Education on Hospice  Prognosis:  His prognosis likely will be limited to weeks at best.  I shared this with him today.  I think that  it is very possible that his overall prognosis will be 2 weeks or less as the focus of his care becomes comfort and his multiple comorbid conditions (particularly liver and renal failure and hypercalcemia) continued to worsen.  Discharge Planning: Hospice facility (recommendation for this by Dr. Burr Medico).  He has been having increasing symptom burden, particularly related to hypercalcemia, over the past month. As treatment plan is shifted to focus on comfort, I think that is very likely that he will continue to have increasing symptom burden that would best be managed at residential hospice if this can be arranged.      Primary Diagnoses: Present  on Admission: . (Resolved) Altered mental status . Hypercalcemia . Hepatocellular carcinoma ([place]) . Diabetes mellitus type II, uncontrolled ([place]) . Liver cirrhosis ([place]) . Ileus ([place]) . Toxic metabolic encephalopathy   I have reviewed the medical record, interviewed the patient and family, and examined the patient. The following aspects are pertinent.  Past Medical History:  Diagnosis Date  . Chicken pox   . Diabetes mellitus without complication ([place])   . Hepatitis C   . Hepatocellular carcinoma ([place]) 03/28/2016  . Hypertension    Social History   Social History  . Marital status: Divorced    Spouse name: N/A  . Number of children: N/A  . Years of education: N/A   Social History Main Topics  . Smoking status: Current Some Day Smoker    Packs/day: 0.25    Types: Cigarettes  . Smokeless tobacco: Never Used  . Alcohol use Yes  . Drug use: No  . Sexual activity: Not Asked   Other Topics Concern  . None   Social History Narrative  . None   Family History  Problem Relation Age of Onset  . Heart attack Father     Deceased  . Heart disease Father   . Diabetes Mother     Living  . Heart attack Brother 104    Deceased  . Sudden death Brother   . Thyroid disease Sister   . Diabetes Sister   . Healthy Sister     x1  . Colon cancer Brother     x1  . Heart disease Maternal Grandfather   . Allergies Daughter   . Diabetes Son   . Diabetes Maternal Grandmother    Scheduled Meds: . calcitonin  400 Units Intramuscular BID  . cefTRIAXone (ROCEPHIN)  IV  1 g Intravenous Q24H  . chlorhexidine  15 mL Mouth Rinse BID  . enoxaparin (LOVENOX) injection  30 mg Subcutaneous Q24H  . feeding supplement  1 Container Oral TID BM  . feeding supplement (ENSURE ENLIVE)  237 mL Oral BID BM  . insulin aspart  0-9 Units Subcutaneous Q4H  . lactulose  30 g Oral TID  . mouth rinse  15 mL Mouth Rinse q12n4p  . pantoprazole  40 mg Oral Daily  . pregabalin  100 mg Oral BID  .  tamsulosin  0.8 mg Oral Daily   Continuous Infusions: . sodium chloride 75 mL/hr at 04/24/16 0858   PRN Meds:.albuterol Medications Prior to Admission:  Prior to Admission medications   Medication Sig Start Date End Date Taking? Authorizing Provider  albuterol (PROVENTIL) (2.5 MG/3ML) 0.083% nebulizer solution Take 3 mLs (2.5 mg total) by nebulization every 2 (two) hours as needed for wheezing. 03/31/16  Yes Mauricio Gerome Apley, MD  fluticasone (FLONASE) 50 MCG/ACT nasal spray Place 2 sprays into both nostrils daily. Patient taking differently: Place 2 sprays into both nostrils  daily as needed for allergies.  03/27/16  Yes Brunetta Jeans, PA-C  ketoconazole (NIZORAL) 2 % cream Apply 1 application topically daily. Patient taking differently: Apply 1 application topically daily as needed for irritation.  02/27/16  Yes Trula Slade, DPM  loratadine (CLARITIN) 10 MG tablet Take 1 tablet (10 mg total) by mouth daily as needed for allergies. 01/30/15  Yes Brunetta Jeans, PA-C  losartan (COZAAR) 50 MG tablet Take 50 mg by mouth daily.   Yes Historical Provider, MD  omeprazole (PRILOSEC OTC) 20 MG tablet Take 20 mg by mouth daily.   Yes Historical Provider, MD  pregabalin (LYRICA) 100 MG capsule Take 100 mg by mouth 2 (two) times daily.   Yes Historical Provider, MD  tamsulosin (FLOMAX) 0.4 MG CAPS capsule TAKE TWO CAPSULES BY MOUTH ONCE DAILY Patient taking differently: TAKE 0.8 MG BY MOUTH ONCE DAILY 04/07/16  Yes Brunetta Jeans, PA-C  traMADol (ULTRAM) 50 MG tablet Take 1 tablet (50 mg total) by mouth every 6 (six) hours as needed. Patient taking differently: Take 50 mg by mouth every 6 (six) hours as needed for moderate pain or severe pain.  04/11/16  Yes Truitt Merle, MD  traMADol-acetaminophen (ULTRACET) 37.5-325 MG tablet Take 1 tablet by mouth every 6 (six) hours as needed for moderate pain or severe pain.   Yes Historical Provider, MD   Allergies  Allergen Reactions  . Gadolinium  Derivatives Hives and Itching  . Eovist [Gadoxetate] Hives and Itching    Hives and itching over face and chest after 10 ml Eovist given. 50 mg Benadryl given and he was observed by nurse and Dr. Jeralyn Ruths until symptoms improved and he was released. ry   Review of Systems  Denies complaints at this time.  Physical Exam General: Alert, chronically ill and tired appearing, in no acute distress.  HEENT: + scleral icterus Heart: Regular rate and rhythm.  Lungs: Good air movement, clear Abdomen: Soft, nontender, distended, positive bowel sounds, ascities.  Ext: No significant edema Skin: Warm and dry Neuro: Grossly intact, nonfocal.  Vital Signs: BP 121/83 (BP Location: Right Arm)   Pulse 94   Temp 98.1 F (36.7 C) (Oral)   Resp 19   Ht 5' 7" (1.702 m)   Wt 103 kg (227 lb 1.2 oz)   SpO2 96%   BMI 35.56 kg/m  Pain Assessment: PAINAD   Pain Score: Asleep   SpO2: SpO2: 96 % O2 Device:SpO2: 96 % O2 Flow Rate: .   IO: Intake/output summary:  Intake/Output Summary (Last 24 hours) at 04/24/16 1458 Last data filed at 04/24/16 0900  Gross per 24 hour  Intake          1841.25 ml  Output             1625 ml  Net           216.25 ml    LBM: Last BM Date: 04/23/16 Baseline Weight: Weight: 102.1 kg (225 lb) Most recent weight: Weight: 103 kg (227 lb 1.2 oz)     Palliative Assessment/Data:   Flowsheet Rows     Most Recent Value  Intake Tab  Referral Department  Hospitalist  Unit at Time of Referral  Med/Surg Unit  Palliative Care Primary Diagnosis  Cancer  Date Notified  04/23/16  Palliative Care Type  New Palliative care  Reason for referral  Clarify Goals of Care, Counsel Regarding Hospice  Date of Admission  04/20/16  Date first seen by Palliative Care  04/24/16  # of days Palliative referral response time  1 Day(s)  # of days IP prior to Palliative referral  3  Clinical Assessment  Palliative Performance Scale Score  30%  Pain Max last 24 hours  2  Pain Min Last 24  hours  0  Psychosocial & Spiritual Assessment  Palliative Care Outcomes  Patient/Family meeting held?  Yes  Who was at the meeting?  Patient, wife  Palliative Care Outcomes  Clarified goals of care, Counseled regarding hospice, Changed CPR status, Completed durable DNR      Time In: 1310 Time Out: 1430 Time Total: 80 Greater than 50%  of this time was spent counseling and coordinating care related to the above assessment and plan.  Signed by: Micheline Rough, MD   Please contact Palliative Medicine Team phone at [phone] for questions and concerns.  For individual provider: See Shea Evans

## 2016-04-24 NOTE — Progress Notes (Signed)
CSW following to assist with patient disposition.  Plan: determine if patient and family agreeable to residential Hospice at this time.   Kathrin Greathouse, Latanya Presser, MSW Clinical Social Worker 5E and Psychiatric Service Line [phone] 04/24/2016  10:59 AM

## 2016-04-24 NOTE — Progress Notes (Signed)
PROGRESS NOTE [facility]  Quaron Delacruz   VOZ:[id number] DOB: [birth year]  DOA: 04/20/2016 PCP: Leeanne Rio, PA-C   Brief Narrative:  7 ? dm ty II,  hypertension, hepatocellular carcinoma stage IV currently on chemotherapy. Untreated hepatitis   Presented to the emergency department with generalized weakness and lethargy and confusion on 3/26   Patient was found to have a calcium level of 14.1 and a workup at the cancer center.  Was treated with Zometa.  Patient admitted acute metabolic encephalopathy secondary to hypercalcemia, hepatic encephalopathy and UTI  Was previously admitted to the hospital 3/1-3/5 for malignant hypercalcemia which was treated at that time with Zomig  Subjective:  Awake but confused Cannot orient reliably Wife present patient has not eaten   Assessment & Plan:  Acute metabolic encephalopathy   multifactorial hepatic encephalopathy, UTI and hypercalcemia Ammonia level up 105 - Increase Lactulose to 30 mg TID Patient with mild ascites-no benefit from paracentesis-with AKI, high risk for hepatorenal sx    Malignant hypercalcemia - corrected Ca 12.0 Continue IVF  Continue calcitonin  On 3/26 was treated with IV lasix likely the cause of AKI  Stop checking labs would not change outcomes  UTI  Continue Rocephin x 3 days  Urine cx no growth   AKI and impending renal failure - likely in setting of diarrhea and lasix -BUN/creatinine currently rising 36/4.05 Baseline creatinine 04/20/16 16/0.99 Stop IVF Stop Lasix  Now all palliative  HCC stage IV/Liver Cirrhosis diagnosed 03/14/2016 the intent of care is palliative and with Dr. Ernestina Penna input patient hopsice eligible  DM type 2 - CBG's stable  SSI is 80- 140 Continue to monitor   Physical deconditioning  PT/OT   Palliative care encounter Have assigned palliative measures and discontinued non-essentials [cbg checks, tele non essential meds not compatible with comfort etc.].  <1 mo life expectancy in my opinion   DVT prophylaxis: Lovenox  Code Status: FULL  Family Communication: Wife at bedside--long discussion, > 30 min and 2 visits today Disposition Plan: free-stading [place]ie in [place]    Consultants:   Oncology-Dr. Burr Medico  Palliative care   Procedures:   Antimicrobials:  Rocephin    Objective: Vitals:   04/23/16 1325 04/23/16 2006 04/24/16 0506 04/24/16 1553  BP: 112/68 (!) 148/91 121/83 125/77  Pulse: 95 95 94 84  Resp: 19 19 19 19   Temp: 98.6 F (37 C) 99.1 F (37.3 C) 98.1 F (36.7 C) 97.4 F (36.3 C)  TempSrc: Axillary Axillary Oral Oral  SpO2: 98% 97% 96% 99%  Weight:      Height:        Intake/Output Summary (Last 24 hours) at 04/24/16 1620 Last data filed at 04/24/16 1500  Gross per 24 hour  Intake          2566.25 ml  Output             1775 ml  Net           791.25 ml   Filed Weights   04/20/16 1941 04/21/16 0200  Weight: 102.1 kg (225 lb) 103 kg (227 lb 1.2 oz)    Examination:  General exam: NAD, jaundice,  somewhat confused HEENT: Scleral icterus  Respiratory system: Clear anteriorly  Cardiovascular system: S1S2 RRR no murmurs  Gastrointestinal system: Abdomen distended, firm , positive fluid wave, no hepatomegaly noted  Central nervous system: Oriented to person only  Extremities: No pedal edema. Skin: No rashes, lesions or ulcers  Data Reviewed: I have personally reviewed following labs  and imaging studies  CBC:  Recent Labs Lab 04/18/16 1318 04/20/16 1945 04/23/16 0446  WBC 6.5 8.8 6.4  NEUTROABS 4.2  --  3.9  HGB 13.3 13.9 11.6*  HCT 38.4 37.8* 32.8*  MCV 82.4 77.1* 80.4  PLT 258 265 338   Basic Metabolic Panel:  Recent Labs Lab 04/20/16 1945 04/21/16 0513 04/22/16 0504 04/22/16 1844 04/23/16 0446  NA 132* 134* 134* 133* 137  K 4.0 4.2 3.9 3.8 3.8  CL 101 105 107 109 110  CO2 22 20* 18* 17* 17*  GLUCOSE 134* 114* 109* 106* 82  BUN 16 21* 33* 35* 36*  CREATININE 0.99 1.53* 3.58*  4.19* 4.05*  CALCIUM 13.3* 12.2* 11.1* 10.8* 10.9*  PHOS  --   --   --  2.7  --    GFR: Estimated Creatinine Clearance: 21.9 mL/min (A) (by C-G formula based on SCr of 4.05 mg/dL (H)). Liver Function Tests:  Recent Labs Lab 04/18/16 1318 04/20/16 1945 04/21/16 0513 04/22/16 0504 04/22/16 1844 04/23/16 0446  AST 656* 597* 562* 869*  --  836*  ALT 50 49 42 50  --  51  ALKPHOS 273* 224* 176* 169*  --  164*  BILITOT 6.48* 10.3* 9.1* 6.9*  --  6.5*  PROT 7.3 7.5 6.6 6.3*  --  6.3*  ALBUMIN 2.8* 2.8* 2.5* 2.4* 2.5* 2.3*    Recent Labs Lab 04/20/16 1945  LIPASE 32    Recent Labs Lab 04/20/16 2028 04/21/16 1150 04/22/16 0504  AMMONIA 54* 73* 105*   Coagulation Profile: No results for input(s): INR, PROTIME in the last 168 hours. Cardiac Enzymes: No results for input(s): CKTOTAL, CKMB, CKMBINDEX, TROPONINI in the last 168 hours. BNP (last 3 results) No results for input(s): PROBNP in the last 8760 hours. HbA1C: No results for input(s): HGBA1C in the last 72 hours. CBG:  Recent Labs Lab 04/23/16 2342 04/24/16 0441 04/24/16 0738 04/24/16 1233 04/24/16 1603  GLUCAP 124* 130* 135* 177* 146*   Lipid Profile: No results for input(s): CHOL, HDL, LDLCALC, TRIG, CHOLHDL, LDLDIRECT in the last 72 hours. Thyroid Function Tests: No results for input(s): TSH, T4TOTAL, FREET4, T3FREE, THYROIDAB in the last 72 hours. Anemia Panel: No results for input(s): VITAMINB12, FOLATE, FERRITIN, TIBC, IRON, RETICCTPCT in the last 72 hours. Sepsis Labs:  Recent Labs Lab 04/20/16 2002  LATICACIDVEN 1.34    Recent Results (from the past 240 hour(s))  Urine culture     Status: None   Collection Time: 04/20/16  8:21 PM  Result Value Ref Range Status   Specimen Description URINE, RANDOM  Final   Special Requests NONE  Final   Culture   Final    NO GROWTH Performed at [facility], [address], [place], [place] [address]    Report Status 04/22/2016 FINAL  Final       Radiology Studies: No results found.  Scheduled Meds: . calcitonin  400 Units Intramuscular BID  . cefTRIAXone (ROCEPHIN)  IV  1 g Intravenous Q24H  . chlorhexidine  15 mL Mouth Rinse BID  . enoxaparin (LOVENOX) injection  30 mg Subcutaneous Q24H  . feeding supplement  1 Container Oral TID BM  . feeding supplement (ENSURE ENLIVE)  237 mL Oral BID BM  . insulin aspart  0-9 Units Subcutaneous Q4H  . lactulose  30 g Oral TID  . mouth rinse  15 mL Mouth Rinse q12n4p  . pantoprazole  40 mg Oral Daily  . pregabalin  100 mg Oral BID  .  tamsulosin  0.8 mg Oral Daily   Continuous Infusions: . sodium chloride 75 mL/hr at 04/24/16 0858     LOS: 3 days   45 min  Verneita Griffes, MD [facility] (P) [phone]   If 7PM-7AM, please contact night-coverage [email or web address] Password [email or web address] 04/24/2016, 4:20 PM

## 2016-04-25 LAB — GLUCOSE, CAPILLARY: Glucose-Capillary: 106 mg/dL — ABNORMAL HIGH (ref 65–99)

## 2016-04-25 MED ORDER — FAMOTIDINE 20 MG PO TABS: 20.0000 mg | ORAL_TABLET | Freq: Every day | ORAL | Status: DC

## 2016-04-25 MED ORDER — HYDROMORPHONE HCL 1 MG/ML PO LIQD: 0.5000 mg | ORAL | 0 refills | Status: AC | PRN

## 2016-04-25 NOTE — Progress Notes (Signed)
Daily Progress Note   Patient Name: Casey Reyes       Date: 04/25/2016 DOB: [birth year]  Age: 61 y.o. MRN#: [id number] Attending Physician: Nita Sells, MD Primary Care Physician: Leeanne Rio, PA-C Admit Date: 04/20/2016  Reason for Consultation/Follow-up: Disposition, Establishing goals of care, Inpatient hospice referral, Non pain symptom management, Pain control and Psychosocial/spiritual support  Subjective: Casey Reyes was alone in his room at time of my encounter.  His only concern today was asking for a drink of water.  He finished half of a small cup when offered.  Length of Stay: 4  Current Medications: Scheduled Meds:  . calcitonin  400 Units Intramuscular BID  . chlorhexidine  15 mL Mouth Rinse BID  . feeding supplement (ENSURE ENLIVE)  237 mL Oral BID BM  . lactulose  30 g Oral TID  . mouth rinse  15 mL Mouth Rinse q12n4p  . pregabalin  100 mg Oral BID  . tamsulosin  0.8 mg Oral Daily    Continuous Infusions:   PRN Meds: albuterol  Physical Exam         General: Alert, chronically ill and tired appearing, in no acute distress.  HEENT: + scleral icterus Heart: Regular rate and rhythm.  Lungs: Good air movement, clear Abdomen: Soft, nontender, distended, positive bowel sounds, ascities.  Ext: No significant edema Skin: Warm and dry Neuro: Grossly intact, nonfocal. Mild confusion.  Vital Signs: BP 121/74 (BP Location: Right Arm)   Pulse 96   Temp 97.5 F (36.4 C) (Oral)   Resp 20   Ht 5\' 7"  (1.702 m)   Wt 103 kg (227 lb 1.2 oz)   SpO2 96%   BMI 35.56 kg/m  SpO2: SpO2: 96 % O2 Device: O2 Device: Not Delivered O2 Flow Rate:    Intake/output summary:  Intake/Output Summary (Last 24 hours) at 04/25/16 1116 Last data filed at 04/25/16 0528  Gross per 24 hour  Intake              875 ml  Output             1175 ml  Net             -300 ml   LBM: Last BM Date: 04/24/16 Baseline Weight: Weight: 102.1 kg (225 lb) Most recent weight: Weight: 103 kg (  227 lb 1.2 oz)       Palliative Assessment/Data:    Flowsheet Rows     Most Recent Value  Intake Tab  Referral Department  Hospitalist  Unit at Time of Referral  Med/Surg Unit  Palliative Care Primary Diagnosis  Cancer  Date Notified  04/23/16  Palliative Care Type  New Palliative care  Reason for referral  Clarify Goals of Care, Counsel Regarding Hospice  Date of Admission  04/20/16  Date first seen by Palliative Care  04/24/16  # of days Palliative referral response time  1 Day(s)  # of days IP prior to Palliative referral  3  Clinical Assessment  Palliative Performance Scale Score  30%  Pain Max last 24 hours  2  Pain Min Last 24 hours  0  Psychosocial & Spiritual Assessment  Palliative Care Outcomes  Patient/Family meeting held?  Yes  Who was at the meeting?  Patient, wife  Palliative Care Outcomes  Clarified goals of care, Counseled regarding hospice, Changed CPR status, Completed durable DNR      Patient Active Problem List   Diagnosis Date Noted  . Ileus ([place]) 04/21/2016  . Toxic metabolic encephalopathy 70/62/3762  . AKI (acute kidney injury) ([place]) 04/09/2016  . Goals of care, counseling/discussion 04/05/2016  . Liver cirrhosis ([place]) 04/03/2016  . Hepatocellular carcinoma ([place]) 03/28/2016  . Hypercalcemia 03/27/2016  . Elevated liver enzymes 02/14/2016  . BPH associated with nocturia 02/14/2016  . Prostate cancer screening 06/04/2015  . Diabetic neuropathy ([place]) 08/03/2014  . Insomnia 04/17/2014  . Hepatitis C 01/16/2014  . Diabetes mellitus type II, uncontrolled ([place]) 12/20/2013  . Visit for preventive health examination 07/24/2013  . Essential hypertension 07/24/2013  . Erectile dysfunction 07/24/2013    Palliative Care Assessment & Plan    Patient Profile: 61 y.o. male  with past medical history of hypertention, diabetes, hepatitis C, and recent diagnosis of hepatocellular carcinoma admitted on 04/20/2016 with encephalopathy, hypercalcemia, UTI, liver and renal failure.   Recommendations/Plan:  He and his wife desire residential hospice for EOL care.  He has been having increasing symptom burden, particularly related to hypercalcemia, over the past month. As treatment plan is shifted to focus on comfort, I think that is very likely that he will continue to have increasing symptom burden that would best be managed at residential hospice if this can be arranged.   Goals of Care and Additional Recommendations:  Limitations on Scope of Treatment: Full Comfort Care  Code Status:    Code Status Orders        Start     Ordered   04/24/16 1409  Do not attempt resuscitation (DNR)  Continuous    Question Answer Comment  In the event of cardiac or respiratory ARREST Do not call a "code blue"   In the event of cardiac or respiratory ARREST Do not perform Intubation, CPR, defibrillation or ACLS   In the event of cardiac or respiratory ARREST Use medication by any route, position, wound care, and other measures to relive pain and suffering. May use oxygen, suction and manual treatment of airway obstruction as needed for comfort.      04/24/16 1408    Code Status History    Date Active Date Inactive Code Status Order ID Comments User Context   04/21/2016  2:56 AM 04/24/2016  2:08 PM Full Code [id number]  Etta Quill, DO Inpatient   03/28/2016  4:12 AM 03/31/2016  1:57 PM Full Code [id number]  Norval Morton, MD Inpatient  Prognosis:  I think his overall prognosis will likely be 2 weeks or less as the focus of his care becomes comfort and his multiple comorbid conditions (particularly liver and renal failure and hypercalcemia) continue to worsen.  Discharge Planning:  Hospice facility  Thank you for allowing the  Palliative Medicine Team to assist in the care of this patient.   Total Time 20 Prolonged Time Billed  no       Greater than 50%  of this time was spent counseling and coordinating care related to the above assessment and plan.  Micheline Rough, MD  Please contact Palliative Medicine Team phone at [phone] for questions and concerns.

## 2016-04-25 NOTE — Discharge Summary (Signed)
Physician Discharge Summary  Casey Reyes PYP:[id number] DOB: 1955/09/17 DOA: 04/20/2016  PCP: Leeanne Rio, PA-C  Admit date: 04/20/2016 Discharge date: 04/25/2016  Time spent: 40 minutes  Recommendations for Outpatient Follow-up:  1. Patient will be d/c to Hospice facility for end of life care 2. Hard script dilaudid solution givenFor further comfort measures as well as medications and comfort a stick approach to be consolidated on discharge at facility  Discharge Diagnoses:  Principal Problem:   Toxic metabolic encephalopathy Active Problems:   Diabetes mellitus type II, uncontrolled ([place])   Hypercalcemia   Hepatocellular carcinoma ([place])   Liver cirrhosis ([place])   Ileus ([place])   Discharge Condition: Guarded less than 1 month  Diet recommendation: comfort feeds  Filed Weights   04/20/16 1941 04/21/16 0200  Weight: 102.1 kg (225 lb) 103 kg (227 lb 1.2 oz)    History of present illness:  61 ? dm ty II,  hypertension, hepatocellular carcinoma stage IV currently on chemotherapy. Untreated hepatitis   Presented to the emergency department with generalized weakness and lethargy and confusion on 3/26   Patient was found to have a calcium level of 14.1 and a workup at the cancer center.  Was treated with Zometa.  Patient admitted acute metabolic encephalopathy secondary to hypercalcemia, hepatic encephalopathy and UTI  Was previously admitted to the hospital 3/1-3/5 for malignant hypercalcemia which was treated at that time with [facility] Hospital Course:  During the hospitalization patient was found to have significant worsening of creatinine and acute kidney injury He was also found to have poor mentation and although this did recover form being very lethargic and confused with toxic metabolic encephalopathy on admission-he was still only oriented to person He was seen by oncology and as no further durable options for chemo would help per Dr. Krista Blue, Dr. Domingo Cocking of  [facility] consolidated his care and felt he was hospcie eligible which his with was agreeable with d/c to free standing hospice and multiple med not consistent with comfort approach were d/c  Consultations:  Oncology dr. Annetta Maw dr. Domingo Cocking  Discharge Exam: Vitals:   04/24/16 2118 04/25/16 0527  BP: 133/72 121/74  Pulse: 82 96  Resp: 19 20  Temp: 97.6 F (36.4 C) 97.5 F (36.4 C)    General: awake alert to person-very weak Cardiovascular:  s1 s2 no m/r/g Respiratory: clear  Discharge Instructions   Discharge Instructions    Diet - low sodium heart healthy    Complete by:  As directed    Increase activity slowly    Complete by:  As directed      Current Discharge Medication List    START taking these medications   Details  HYDROmorphone HCl (DILAUDID) 1 MG/ML LIQD Take 0.5 mLs (0.5 mg total) by mouth every 4 (four) hours as needed for severe pain. Qty: 210 mL, Refills: 0      CONTINUE these medications which have NOT CHANGED   Details  albuterol (PROVENTIL) (2.5 MG/3ML) 0.083% nebulizer solution Take 3 mLs (2.5 mg total) by nebulization every 2 (two) hours as needed for wheezing. Qty: 75 mL, Refills: 12    tamsulosin (FLOMAX) 0.4 MG CAPS capsule TAKE TWO CAPSULES BY MOUTH ONCE DAILY Qty: 60 capsule, Refills: 1   Associated Diagnoses: Benign prostatic hyperplasia with lower urinary tract symptoms, symptom details unspecified      STOP taking these medications     fluticasone (FLONASE) 50 MCG/ACT nasal spray      ketoconazole (NIZORAL) 2 %  cream      loratadine (CLARITIN) 10 MG tablet      losartan (COZAAR) 50 MG tablet      omeprazole (PRILOSEC OTC) 20 MG tablet      pregabalin (LYRICA) 100 MG capsule      traMADol (ULTRAM) 50 MG tablet      traMADol-acetaminophen (ULTRACET) 37.5-325 MG tablet        Allergies  Allergen Reactions  . Gadolinium Derivatives Hives and Itching  . Eovist [Gadoxetate] Hives and Itching    Hives and  itching over face and chest after 10 ml Eovist given. 50 mg Benadryl given and he was observed by nurse and Dr. Jeralyn Ruths until symptoms improved and he was released. ry      The results of significant diagnostics from this hospitalization (including imaging, microbiology, ancillary and laboratory) are listed below for reference.    Significant Diagnostic Studies: Ct Abdomen Pelvis W Wo Contrast  Result Date: 03/30/2016 CLINICAL DATA:  Evaluate liver lesions seen on recent MRI knee. EXAM: CT CHEST WITH CONTRAST CT ABDOMEN AND PELVIS WITH AND WITHOUT CONTRAST TECHNIQUE: Multidetector CT imaging of the chest was performed during intravenous contrast administration. Multidetector CT imaging of the abdomen and pelvis was performed following the standard protocol before and during bolus administration of intravenous contrast. CONTRAST:  176mL ISOVUE-300 IOPAMIDOL (ISOVUE-300) INJECTION 61% COMPARISON:  MRI abdomen 03/14/2016 FINDINGS: CT CHEST FINDINGS Chest wall: No chest wall mass, supraclavicular or axillary lymphadenopathy. The thyroid gland appears normal. Cardiovascular: The heart is normal in size. No pericardial effusion. There is tortuosity, ectasia and calcification of the thoracic aorta. No dissection or focal aneurysm. This mild fusiform dilatation of the descending thoracic aorta with maximum measurement of 3.3 cm. Age advanced fairly extensive three-vessel coronary artery calcifications. Mediastinum/Nodes: No mediastinal or hilar mass or lymphadenopathy. The esophagus is grossly normal. Lungs/Pleura: No acute pulmonary findings. No worrisome pulmonary lesions. Musculoskeletal: No significant bony findings. CT ABDOMEN AND PELVIS FINDINGS Hepatobiliary: Advanced cirrhotic changes involving the liver with portal venous hypertension and portal venous collaterals. There are numerous large masses in the liver as demonstrated on the recent MRI most likely reflecting multifocal hepatoma is. His all are  enhancement pattern liver is due to thrombosis of the middle and right portal veins likely tumor thrombus. The main portal vein may be partially thrombosed also. The left portal vein appears patent. Early cavernous transformation with numerous small portal collaterals. The lesions are more distinct and better measured on the MRI scan. Pancreas: No mass, inflammation or ductal dilatation. Spleen: Normal size.  No focal lesions. Adrenals/Urinary Tract: The adrenal glands and kidneys are unremarkable. Stomach/Bowel: The stomach, duodenum, small bowel and colon are grossly normal. No inflammatory changes, mass lesions or obstructive findings. The terminal ileum is normal. The appendix is normal. Vascular/Lymphatic: Enlarged periportal and hepatic duodenum ligament lymph nodes. The largest node measures 12 mm on image number 63. Scattered aortic and branch vessel calcifications. No aneurysm or dissection. The branch vessels are patent. Reproductive: Enlarged prostate gland with median lobe hypertrophy impresses on the base the bladder. No bladder mass or asymmetric wall thickening. Dense calcifications of the vas deferens are noted. Other: No pelvic mass or adenopathy. No free pelvic fluid collections. No inguinal mass or adenopathy. No abdominal wall hernia or subcutaneous lesions. Musculoskeletal: No significant bony findings. IMPRESSION: 1. Cirrhosis with portal hypertension and portal venous collaterals. 2. Numerous large confluent hepatic masses consistent with hepatomas. 3. 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 There is early cavernous transformation and a bizarre enhancement pattern of the liver because of it. 4. Periportal adenopathy. 5. No significant findings in the chest. No evidence of pulmonary metastatic disease. 6. Age advanced three-vessel coronary artery calcifications. Electronically Signed   By: Marijo Sanes M.D.   On:  03/30/2016 14:10   Ct Chest W Contrast  Result Date: 03/30/2016 CLINICAL DATA:  Evaluate liver lesions seen on recent MRI knee. EXAM: CT CHEST WITH CONTRAST CT ABDOMEN AND PELVIS WITH AND WITHOUT CONTRAST TECHNIQUE: Multidetector CT imaging of the chest was performed during intravenous contrast administration. Multidetector CT imaging of the abdomen and pelvis was performed following the standard protocol before and during bolus administration of intravenous contrast. CONTRAST:  138mL ISOVUE-300 IOPAMIDOL (ISOVUE-300) INJECTION 61% COMPARISON:  MRI abdomen 03/14/2016 FINDINGS: CT CHEST FINDINGS Chest wall: No chest wall mass, supraclavicular or axillary lymphadenopathy. The thyroid gland appears normal. Cardiovascular: The heart is normal in size. No pericardial effusion. There is tortuosity, ectasia and calcification of the thoracic aorta. No dissection or focal aneurysm. This mild fusiform dilatation of the descending thoracic aorta with maximum measurement of 3.3 cm. Age advanced fairly extensive three-vessel coronary artery calcifications. Mediastinum/Nodes: No mediastinal or hilar mass or lymphadenopathy. The esophagus is grossly normal. Lungs/Pleura: No acute pulmonary findings. No worrisome pulmonary lesions. Musculoskeletal: No significant bony findings. CT ABDOMEN AND PELVIS FINDINGS Hepatobiliary: Advanced cirrhotic changes involving the liver with portal venous hypertension and portal venous collaterals. There are numerous large masses in the liver as demonstrated on the recent MRI most likely reflecting multifocal hepatoma is. 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 The main portal vein may be partially thrombosed also. The left portal vein appears patent. Early cavernous transformation with numerous small portal collaterals. The lesions are more distinct and better measured on the MRI scan. Pancreas: No mass, inflammation  or ductal dilatation. Spleen: Normal size.  No focal lesions. Adrenals/Urinary Tract: The adrenal glands and kidneys are unremarkable. Stomach/Bowel: The stomach, duodenum, small bowel and colon are grossly normal. No inflammatory changes, mass lesions or obstructive findings. The terminal ileum is normal. The appendix is normal. Vascular/Lymphatic: Enlarged periportal and hepatic duodenum ligament lymph nodes. The largest node measures 12 mm on image number 63. Scattered aortic and branch vessel calcifications. No aneurysm or dissection. The branch vessels are patent. Reproductive: Enlarged prostate gland with median lobe hypertrophy impresses on the base the bladder. No bladder mass or asymmetric wall thickening. Dense calcifications of the vas deferens are noted. Other: No pelvic mass or adenopathy. No free pelvic fluid collections. No inguinal mass or adenopathy. No abdominal wall hernia or subcutaneous lesions. Musculoskeletal: No significant bony findings. IMPRESSION: 1. Cirrhosis with portal hypertension and portal venous collaterals. 2. Numerous large confluent hepatic masses consistent with hepatomas. 3. 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 There is early cavernous transformation and a bizarre enhancement pattern of the liver because of it. 4. Periportal adenopathy. 5. No significant findings in the chest. No evidence of pulmonary metastatic disease. 6. Age advanced three-vessel coronary artery calcifications. Electronically Signed   By: Marijo Sanes M.D.   On: 03/30/2016 14:10   Dg Abdomen Acute W/chest  Result Date: 04/20/2016 CLINICAL DATA:  Acute abdominal pain, no fever, labral lesions EXAM: DG ABDOMEN ACUTE W/ 1V CHEST COMPARISON:  CT scan 03/29/2016 FINDINGS:  Cardiomediastinal silhouette is unremarkable. No infiltrate or pleural effusion. Mild gaseous distended small bowel loops and distal abdomen probable mild ileus. No  small bowel air-fluid levels. No evidence of free abdominal air. IMPRESSION: Mild gaseous distended small bowel loops in distal abdomen probable mild ileus. No small bowel air-fluid levels. No evidence of free abdominal air. No acute disease within chest. Electronically Signed   By: Lahoma Crocker M.D.   On: 04/20/2016 21:33   Ct Renal Stone Study  Result Date: 04/20/2016 CLINICAL DATA:  Generalized abdominal pain for 2 weeks. EXAM: CT ABDOMEN AND PELVIS WITHOUT CONTRAST TECHNIQUE: Multidetector CT imaging of the abdomen and pelvis was performed following the standard protocol without IV contrast. COMPARISON:  CT scan of March 29, 2016. FINDINGS: Lower chest: No acute abnormality. Hepatobiliary: No gallstones are noted. The liver is severely enlarged with nodular contours consistent with hepatic cirrhosis. It is heterogeneous in appearance on these unenhanced images, concerning for underlying hepatocellular carcinoma. Pancreas: Unremarkable. No pancreatic ductal dilatation or surrounding inflammatory changes. Spleen: Normal in size without focal abnormality. Adrenals/Urinary Tract: Adrenal glands are unremarkable. Kidneys are normal, without renal calculi, focal lesion, or hydronephrosis. Bladder is unremarkable. Stomach/Bowel: Stomach is within normal limits. Appendix appears normal. No evidence of bowel wall thickening, distention, or inflammatory changes. Vascular/Lymphatic: Aortic atherosclerosis. No enlarged abdominal or pelvic lymph nodes. Reproductive: Mild prostatic enlargement is noted. Other: Mild ascites is noted.  No hernia is noted. Musculoskeletal: No acute or significant osseous findings. IMPRESSION: Hepatomegaly and hepatic cirrhosis is noted. Hepatic parenchyma is heterogeneous in appearance consistent with underlying masses, concerning for hepatocellular carcinoma. This is noted on prior exams. Aortic atherosclerosis. Mild ascites. Electronically Signed   By: Marijo Conception, M.D.   On: 04/20/2016  22:23    Microbiology: Recent Results (from the past 240 hour(s))  Urine culture     Status: None   Collection Time: 04/20/16  8:21 PM  Result Value Ref Range Status   Specimen Description URINE, RANDOM  Final   Special Requests NONE  Final   Culture   Final    NO GROWTH Performed at [facility], [address] [place], [place] [address]    Report Status 04/22/2016 FINAL  Final     Labs: Basic Metabolic Panel:  Recent Labs Lab 04/20/16 1945 04/21/16 0513 04/22/16 0504 04/22/16 1844 04/23/16 0446  NA 132* 134* 134* 133* 137  K 4.0 4.2 3.9 3.8 3.8  CL 101 105 107 109 110  CO2 22 20* 18* 17* 17*  GLUCOSE 134* 114* 109* 106* 82  BUN 16 21* 33* 35* 36*  CREATININE 0.99 1.53* 3.58* 4.19* 4.05*  CALCIUM 13.3* 12.2* 11.1* 10.8* 10.9*  PHOS  --   --   --  2.7  --    Liver Function Tests:  Recent Labs Lab 04/18/16 1318 04/20/16 1945 04/21/16 0513 04/22/16 0504 04/22/16 1844 04/23/16 0446  AST 656* 597* 562* 869*  --  836*  ALT 50 49 42 50  --  51  ALKPHOS 273* 224* 176* 169*  --  164*  BILITOT 6.48* 10.3* 9.1* 6.9*  --  6.5*  PROT 7.3 7.5 6.6 6.3*  --  6.3*  ALBUMIN 2.8* 2.8* 2.5* 2.4* 2.5* 2.3*    Recent Labs Lab 04/20/16 1945  LIPASE 32    Recent Labs Lab 04/20/16 2028 04/21/16 1150 04/22/16 0504  AMMONIA 54* 73* 105*   CBC:  Recent Labs Lab 04/18/16 1318 04/20/16 1945 04/23/16 0446  WBC 6.5 8.8 6.4  NEUTROABS 4.2  --  3.9  HGB 13.3 13.9 11.6*  HCT 38.4 37.8* 32.8*  MCV 82.4 77.1* 80.4  PLT 258 265 275   Cardiac Enzymes: No results for input(s): CKTOTAL, CKMB, CKMBINDEX, TROPONINI in the last 168 hours. BNP: BNP (last 3 results) No results for input(s): BNP in the last 8760 hours.  ProBNP (last 3 results) No results for input(s): PROBNP in the last 8760 hours.  CBG:  Recent Labs Lab 04/24/16 0441 04/24/16 0738 04/24/16 1233 04/24/16 1603 04/25/16 0720  GLUCAP 130* 135* 177* 146* 106*       Signed:  Nita Sells MD   [facility] 04/25/2016, 9:13 AM

## 2016-04-25 NOTE — Progress Notes (Signed)
[facility] called [facility].  Nurse given transport packet and report number.   Kathrin Greathouse, Latanya Presser, MSW Clinical Social Worker 5E and Psychiatric Service Line [phone] 04/25/2016  3:22 PM

## 2016-04-25 NOTE — Progress Notes (Signed)
Report given to RN Sharyn Lull at [facility] prior to D/C. Pt currently sleeping, resting comfortably in bed with no signs of distress. Incontinent care provided as needed. Wife and mother at bedside.  PIV removed prior to D/C. Condom cath intact. Awaiting [facility] transport at this time.

## 2016-04-25 NOTE — Consult Note (Signed)
[facility] Referral for [facility] received from [place], [place]. Reviewed hospice philosophy and scope of services with the patient, his mother, and his spouse, at the bedside. Medical records and current clinical status reviewed and the patient was approved by hospice provider. The patient will be transported to [facility] via ambulance today. Thank you for this referral.  Yetta Glassman RN [place] Cell: [phone]

## 2016-04-25 NOTE — Care Management Note (Signed)
Case Management Note  Patient Details  Name: Casey Reyes MRN: [id number] Date of Birth: Jun 12, 1955  Subjective/Objective:   Noted for d/c today residential hospice-CSW managing-[facility] evaluating.                 Action/Plan:d/c residential hospice.   Expected Discharge Date:  04/25/16               Expected Discharge Plan:  [place]  In-House Referral:  Clinical Social Work  Discharge planning Services  CM Consult  Post Acute Care Choice:    Choice offered to:     DME Arranged:    DME Agency:     HH Arranged:    [place] Agency:     Status of Service:  Completed, signed off  If discussed at [employer], dates discussed:    Additional Comments:  Dessa Phi, RN 04/25/2016, 11:18 AM

## 2016-04-25 NOTE — Progress Notes (Signed)
Advanced Directive completed with spouse and [name] from [place].  A copy placed on chart, A copy placed in Medical Records and original copy given to spouse of patient.   Casey Reyes

## 2016-04-25 NOTE — Progress Notes (Signed)
CSW received call from [place] at [facility], the liaison will meet with patient and family today.  CSW will continue to assist with patient discharge to residential hospice.   Kathrin Greathouse, Latanya Presser, MSW Clinical Social Worker 5E and Psychiatric Service Line [phone] 04/25/2016  11:41 AM

## 2016-04-28 ENCOUNTER — Telehealth: Payer: Self-pay | Admitting: Physician Assistant

## 2016-04-28 NOTE — Telephone Encounter (Signed)
Spoke with patient's wife. He is at the Hospice facility getting comfort measures. Family is in town and both [place] have a good support system. She was needing information to get records from hospitalizations for [facility]. Gave numbers we had on file for medical records.   [phone]

## 2016-04-28 NOTE — Telephone Encounter (Signed)
Patient's wife requesting a call back from [place] when he is available.  She wants to provide an update on patient's condition.

## 2016-05-27 DEATH — deceased

## 2016-06-10 ENCOUNTER — Ambulatory Visit: Payer: Managed Care, Other (non HMO) | Admitting: Podiatry

## 2016-11-07 ENCOUNTER — Other Ambulatory Visit: Payer: Self-pay | Admitting: Nurse Practitioner

## 2018-07-10 IMAGING — CR DG ORBITS FOR FOREIGN BODY
2 series · 2 of 2 positions shown · non-contrast
Comparison: None.

CLINICAL DATA: Metal working/exposure; clearance prior to MRI

EXAM:
ORBITS FOR FOREIGN BODY - 2 VIEW

[w orbit pa (1 of 2)]
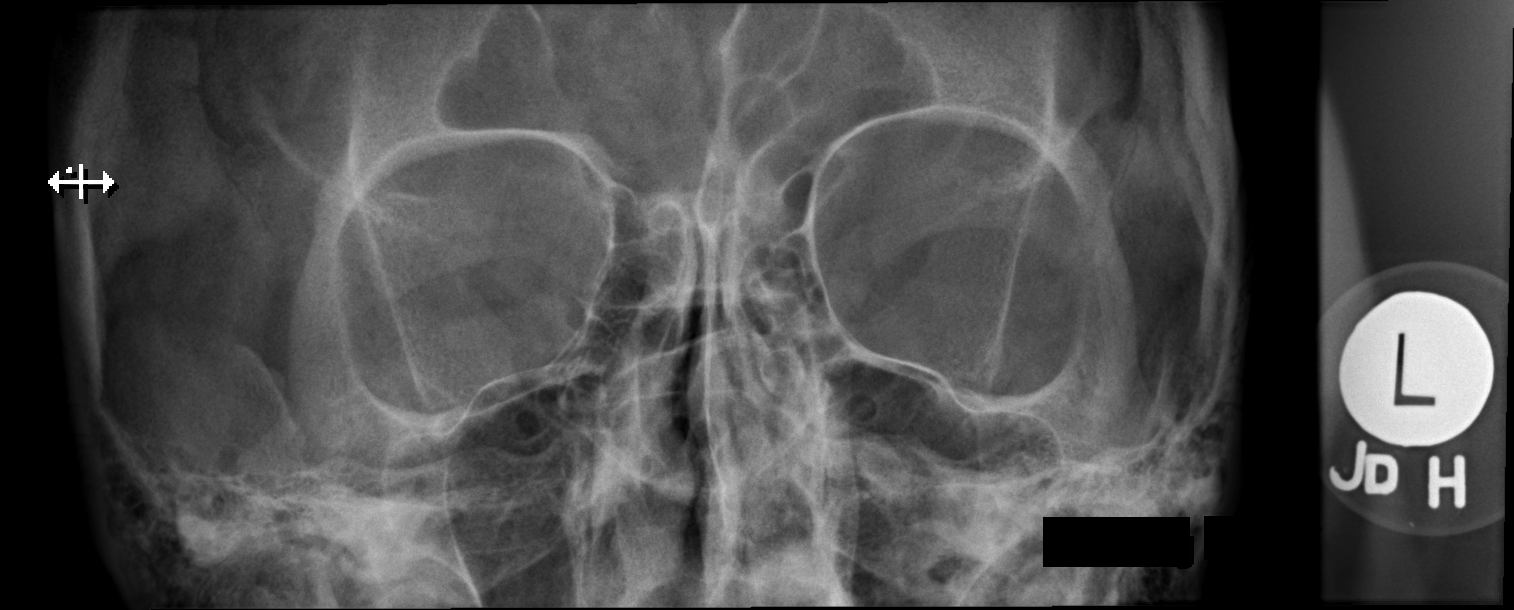

[w orbit pa (2 of 2)]
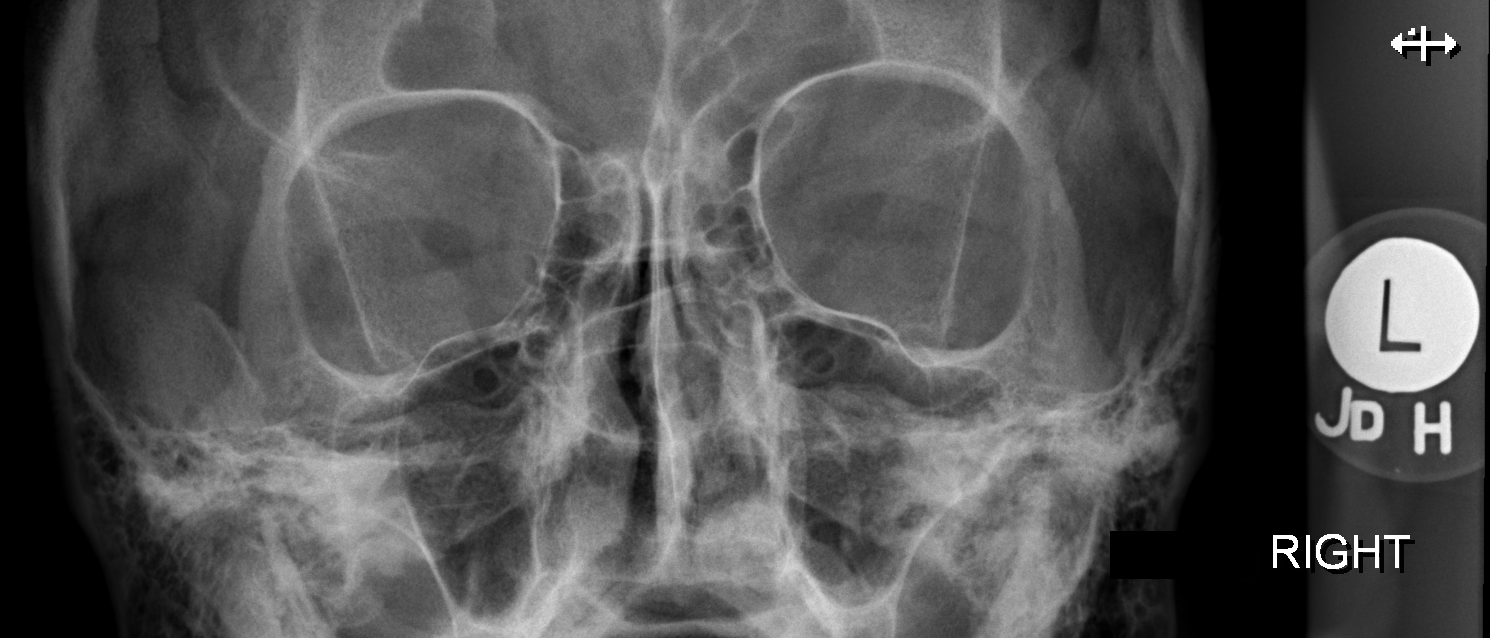

[2 of 2 positions shown; findings below may reference images not displayed]

FINDINGS: There is no evidence of metallic foreign body within the orbits. No
significant bone abnormality identified.
IMPRESSION: No evidence of metallic foreign body within the orbits.

## 2018-07-25 IMAGING — CT CT CHEST W/ CM
3 of 6 series · 15 of 36 positions shown, 17 images · IV contrast (APPLIED)
Comparison: MRI abdomen 03/14/2016

CLINICAL DATA: Evaluate liver lesions seen on recent MRI knee.

EXAM:
CT CHEST WITH CONTRAST
CT ABDOMEN AND PELVIS WITH AND WITHOUT CONTRAST
TECHNIQUE: Multidetector CT imaging of the chest was performed during
intravenous contrast administration. Multidetector CT imaging of the
abdomen and pelvis was performed following the standard protocol
before and during bolus administration of intravenous contrast.
CONTRAST:  100mL [id number] IOPAMIDOL ([id number]) INJECTION 61%

[Series 2: ap w/o 5.0 i31f 1 · axial · non-contrast · 0.91mm/px · z∈[+770,+1050]mm · 5 of 99 slices shown]
[im 15/99  lung]
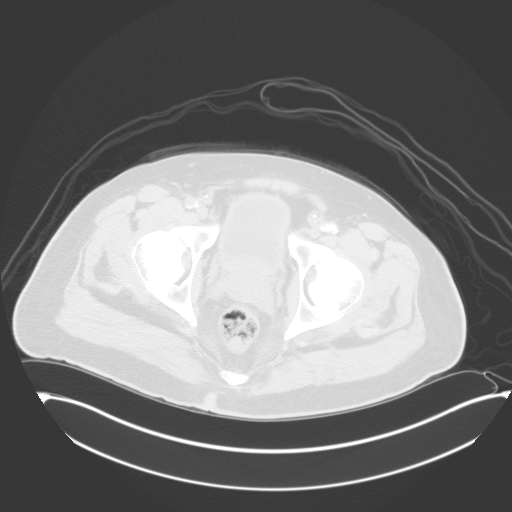
[im 29/99  lung]
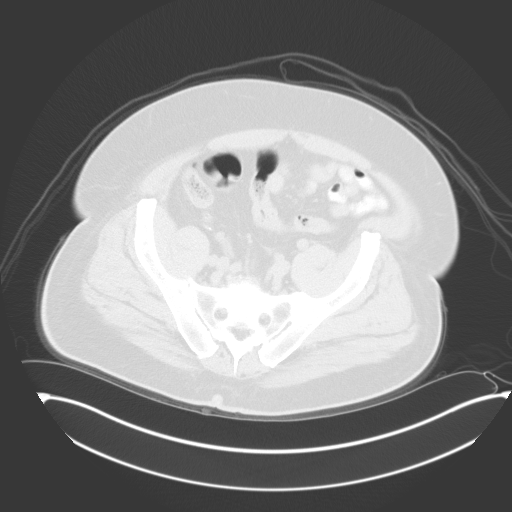
[im 43/99  lung]
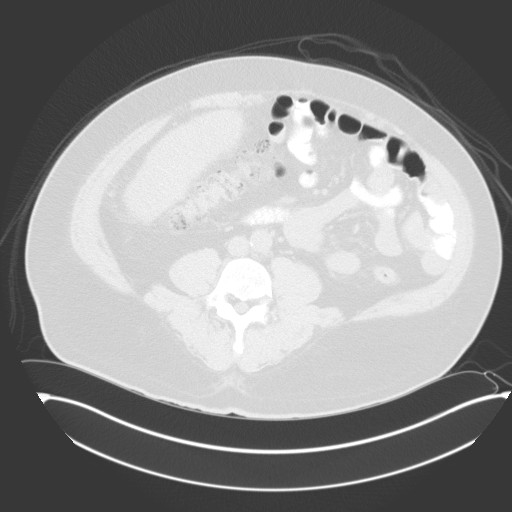
[im 57/99  lung]
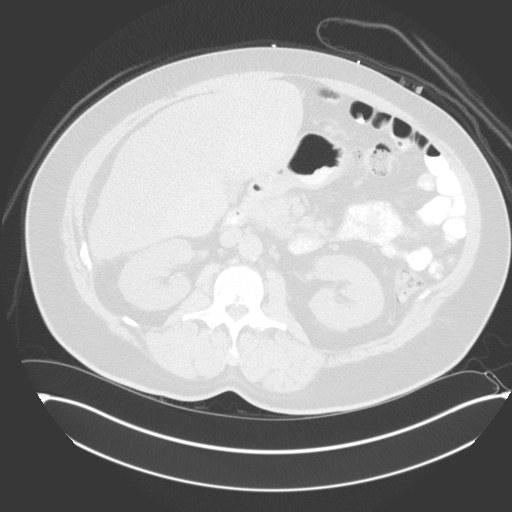
[im 71/99  lung]
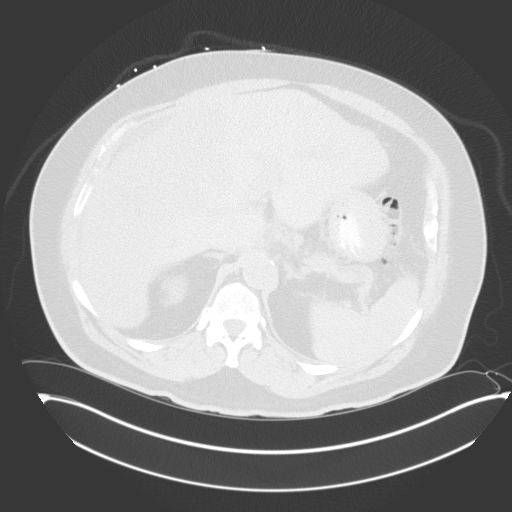

[Series 3: cap 5.0 i31f 1 · axial · 0.91mm/px · z∈[+764,+1234]mm · 7 of 126 slices shown, 9 images]
[im 16/126  mediastinal]
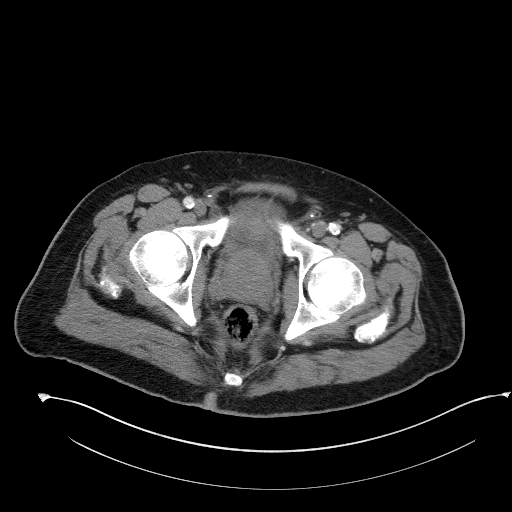
[im 16/126  lung]
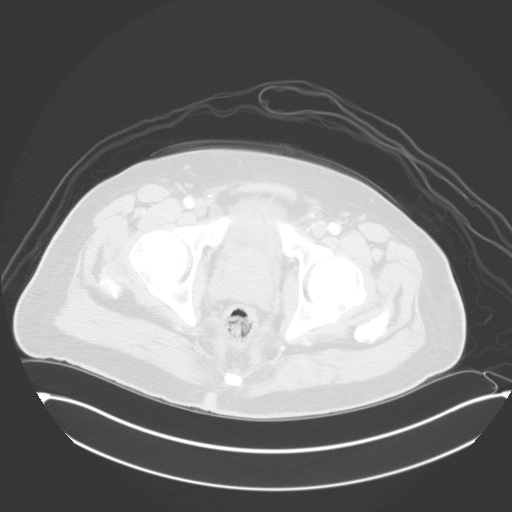
[im 32/126  lung]
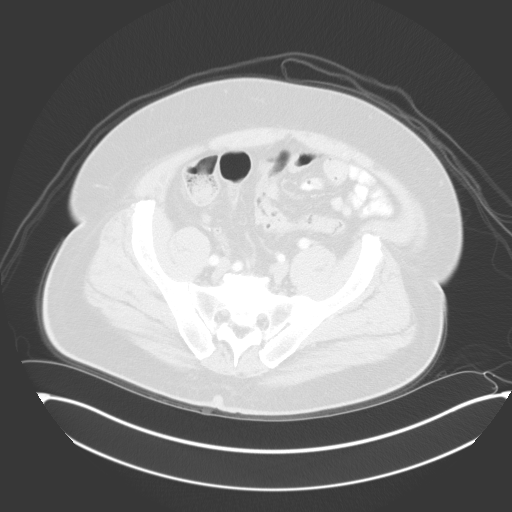
[im 47/126  lung]
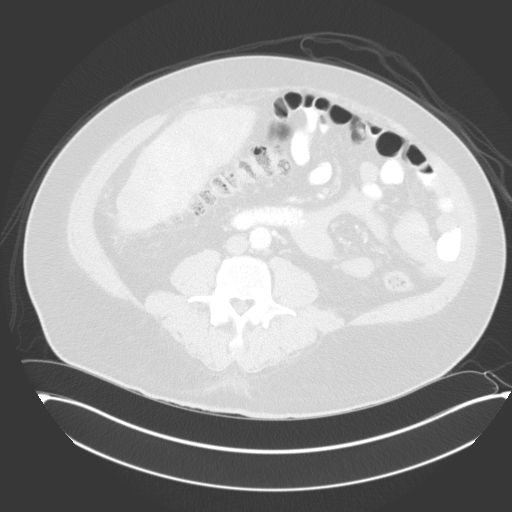
[im 63/126  lung]
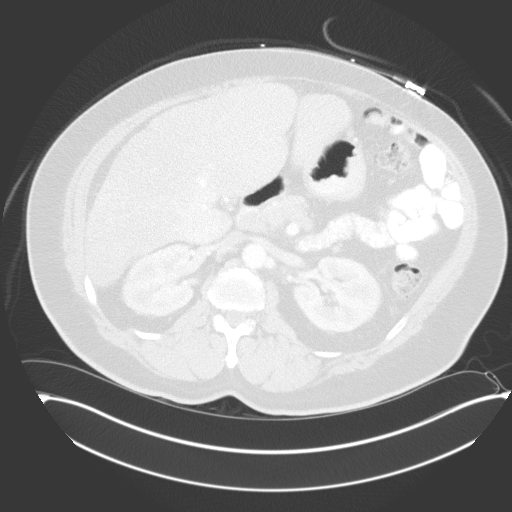
[im 79/126  mediastinal]
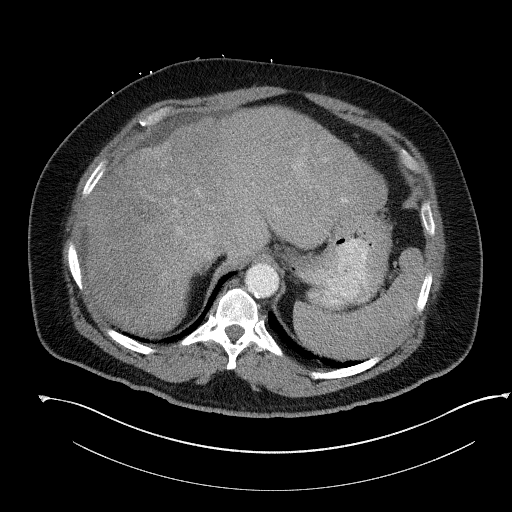
[im 79/126  lung]
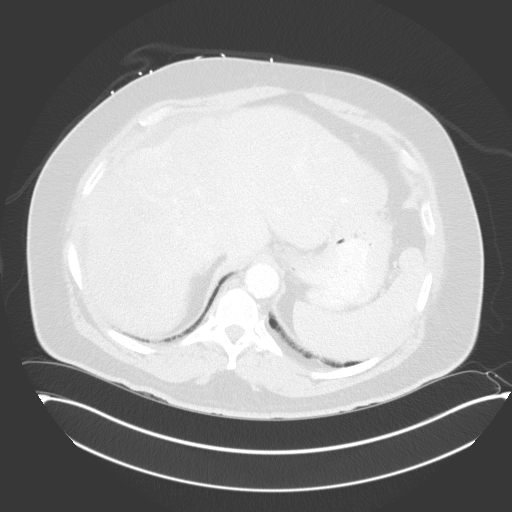
[im 94/126  lung]
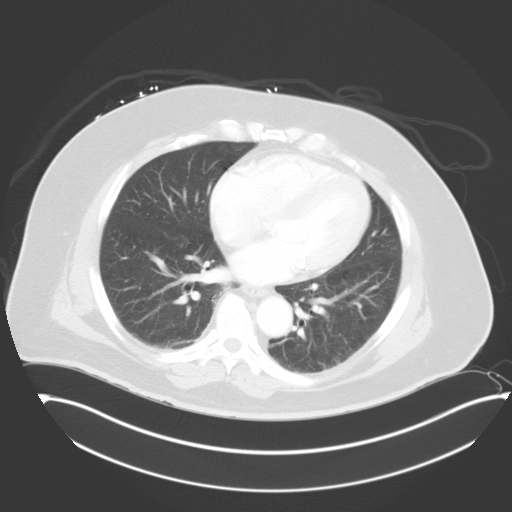
[im 110/126  lung]
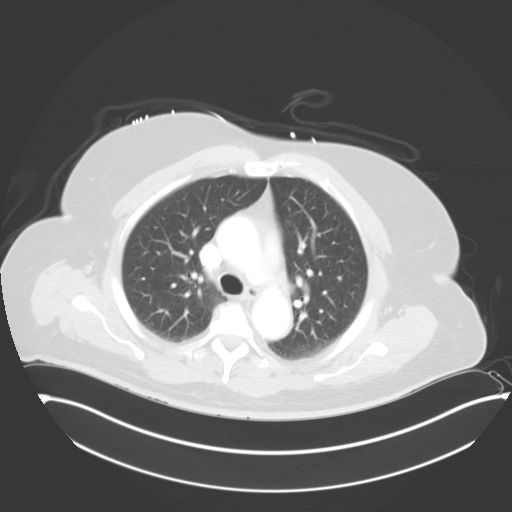

[Series 6: coronal · coronal · 1.04mm/px · 3 of 193 slices shown]
[im 39/193  lung]
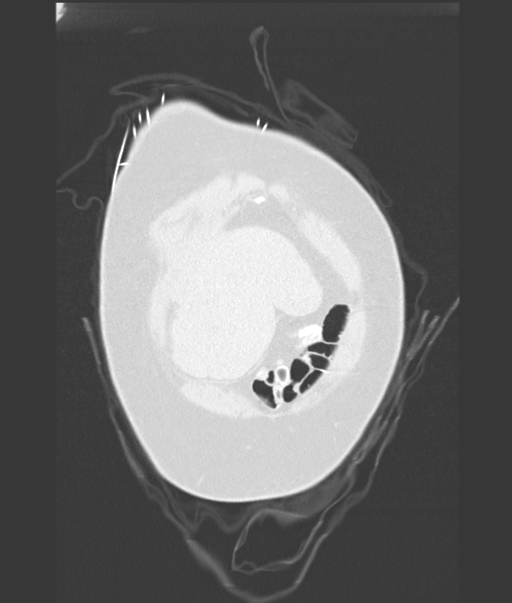
[im 77/193  lung]
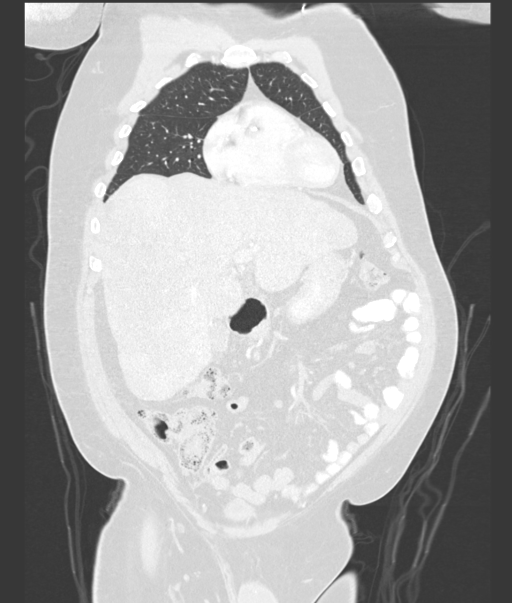
[im 116/193  lung]
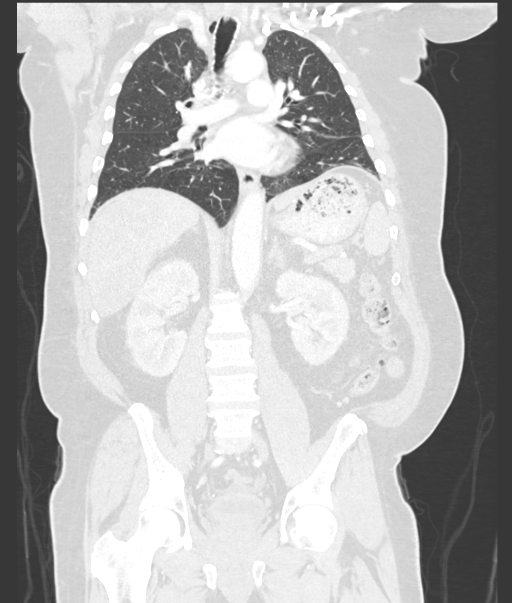

[15 of 36 positions shown; findings below may reference images not displayed]

FINDINGS: CT CHEST FINDINGS

Chest wall: No chest wall mass, supraclavicular or axillary
lymphadenopathy. The thyroid gland appears normal.

Cardiovascular: The heart is normal in size. No pericardial
effusion. There is tortuosity, ectasia and calcification of the
thoracic aorta. No dissection or focal aneurysm. This mild fusiform
dilatation of the descending thoracic aorta with maximum measurement
of 3.3 cm. Age advanced fairly extensive three-vessel coronary
artery calcifications.

Mediastinum/Nodes: No mediastinal or hilar mass or lymphadenopathy.
The esophagus is grossly normal.

Lungs/Pleura: No acute pulmonary findings. No worrisome pulmonary
lesions.

Musculoskeletal: No significant bony findings.

CT ABDOMEN AND PELVIS FINDINGS

Hepatobiliary: Advanced cirrhotic changes involving the liver with
portal venous hypertension and portal venous collaterals. There are
numerous large masses in the liver as demonstrated on the recent MRI
most likely reflecting multifocal hepatoma is. His all are
enhancement pattern liver is due to thrombosis of the middle and
right portal veins likely tumor thrombus. The main portal vein may
be partially thrombosed also. The left portal vein appears patent.
Early cavernous transformation with numerous small portal
collaterals. The lesions are more distinct and better measured on
the MRI scan.

Pancreas: No mass, inflammation or ductal dilatation.

Spleen: Normal size.  No focal lesions.

Adrenals/Urinary Tract: The adrenal glands and kidneys are
unremarkable.

Stomach/Bowel: The stomach, duodenum, small bowel and colon are
grossly normal. No inflammatory changes, mass lesions or obstructive
findings. The terminal ileum is normal. The appendix is normal.

Vascular/Lymphatic: Enlarged periportal and hepatic duodenum
ligament lymph nodes. The largest node measures 12 mm on image
number 63.

Scattered aortic and branch vessel calcifications. No aneurysm or
dissection. The branch vessels are patent.

Reproductive: Enlarged prostate gland with median lobe hypertrophy
impresses on the base the bladder. No bladder mass or asymmetric
wall thickening. Dense calcifications of the vas deferens are noted.

Other: No pelvic mass or adenopathy. No free pelvic fluid
collections. No inguinal mass or adenopathy. No abdominal wall
hernia or subcutaneous lesions.

Musculoskeletal: No significant bony findings.
IMPRESSION: 1. Cirrhosis with portal hypertension and portal venous collaterals.
2. Numerous large confluent hepatic masses consistent with
hepatomas.
3. Thrombosed middle and right portal veins and distal aspect of the
main portal vein likely due to tumor thrombus. There is early
cavernous transformation and a bizarre enhancement pattern of the
liver because of it.
4. Periportal adenopathy.
5. No significant findings in the chest. No evidence of pulmonary
metastatic disease.
6. Age advanced three-vessel coronary artery calcifications.
